# Patient Record
Sex: Female | Born: 1950 | Race: White | Hispanic: No | Marital: Married | Smoking: Former smoker
Health system: Southern US, Community
[De-identification: ages and names within clinical notes are randomized; demographics above are authoritative.]

## PROBLEM LIST (undated history)

## (undated) DIAGNOSIS — J45909 Unspecified asthma, uncomplicated: Secondary | ICD-10-CM

## (undated) DIAGNOSIS — G629 Polyneuropathy, unspecified: Secondary | ICD-10-CM

## (undated) DIAGNOSIS — Z91018 Allergy to other foods: Secondary | ICD-10-CM

## (undated) DIAGNOSIS — A6920 Lyme disease, unspecified: Secondary | ICD-10-CM

## (undated) DIAGNOSIS — J302 Other seasonal allergic rhinitis: Secondary | ICD-10-CM

## (undated) DIAGNOSIS — F419 Anxiety disorder, unspecified: Secondary | ICD-10-CM

## (undated) DIAGNOSIS — K6389 Other specified diseases of intestine: Secondary | ICD-10-CM

## (undated) DIAGNOSIS — L309 Dermatitis, unspecified: Secondary | ICD-10-CM

## (undated) DIAGNOSIS — K219 Gastro-esophageal reflux disease without esophagitis: Secondary | ICD-10-CM

## (undated) DIAGNOSIS — Z9109 Other allergy status, other than to drugs and biological substances: Secondary | ICD-10-CM

## (undated) DIAGNOSIS — R200 Anesthesia of skin: Secondary | ICD-10-CM

## (undated) DIAGNOSIS — T783XXA Angioneurotic edema, initial encounter: Secondary | ICD-10-CM

## (undated) DIAGNOSIS — I2699 Other pulmonary embolism without acute cor pulmonale: Secondary | ICD-10-CM

## (undated) HISTORY — DX: Anesthesia of skin: R20.0

## (undated) HISTORY — DX: Angioneurotic edema, initial encounter: T78.3XXA

## (undated) HISTORY — DX: Polyneuropathy, unspecified: G62.9

## (undated) HISTORY — DX: Gastro-esophageal reflux disease without esophagitis: K21.9

## (undated) HISTORY — DX: Other specified diseases of intestine: K63.89

## (undated) HISTORY — DX: Other seasonal allergic rhinitis: J30.2

## (undated) HISTORY — DX: Anxiety disorder, unspecified: F41.9

## (undated) HISTORY — DX: Allergy to other foods: Z91.018

## (undated) HISTORY — DX: Other allergy status, other than to drugs and biological substances: Z91.09

## (undated) HISTORY — PX: TONSILLECTOMY: SUR1361

## (undated) HISTORY — DX: Unspecified asthma, uncomplicated: J45.909

## (undated) HISTORY — DX: Other pulmonary embolism without acute cor pulmonale: I26.99

## (undated) HISTORY — DX: Lyme disease, unspecified: A69.20

## (undated) HISTORY — PX: APPENDECTOMY: SHX54

## (undated) HISTORY — DX: Dermatitis, unspecified: L30.9

---

## 1978-05-29 HISTORY — PX: PARTIAL HYSTERECTOMY: SHX80

## 2001-05-29 HISTORY — PX: GASTRIC BYPASS: SHX52

## 2011-05-30 HISTORY — PX: WRIST FRACTURE SURGERY: SHX121

## 2012-11-05 DIAGNOSIS — IMO0002 Reserved for concepts with insufficient information to code with codable children: Secondary | ICD-10-CM | POA: Insufficient documentation

## 2013-12-21 DIAGNOSIS — M792 Neuralgia and neuritis, unspecified: Secondary | ICD-10-CM | POA: Insufficient documentation

## 2014-05-29 HISTORY — PX: LUMBAR FUSION: SHX111

## 2014-06-25 DIAGNOSIS — D62 Acute posthemorrhagic anemia: Secondary | ICD-10-CM | POA: Insufficient documentation

## 2014-08-31 DIAGNOSIS — Z86711 Personal history of pulmonary embolism: Secondary | ICD-10-CM | POA: Insufficient documentation

## 2014-08-31 DIAGNOSIS — Z8701 Personal history of pneumonia (recurrent): Secondary | ICD-10-CM | POA: Insufficient documentation

## 2014-09-02 DIAGNOSIS — Z87891 Personal history of nicotine dependence: Secondary | ICD-10-CM | POA: Insufficient documentation

## 2014-09-02 DIAGNOSIS — R0602 Shortness of breath: Secondary | ICD-10-CM | POA: Insufficient documentation

## 2014-09-02 DIAGNOSIS — J45909 Unspecified asthma, uncomplicated: Secondary | ICD-10-CM | POA: Insufficient documentation

## 2014-09-23 DIAGNOSIS — R918 Other nonspecific abnormal finding of lung field: Secondary | ICD-10-CM | POA: Insufficient documentation

## 2015-09-23 DIAGNOSIS — D6859 Other primary thrombophilia: Secondary | ICD-10-CM | POA: Insufficient documentation

## 2015-11-18 DIAGNOSIS — R0781 Pleurodynia: Secondary | ICD-10-CM | POA: Insufficient documentation

## 2016-05-29 HISTORY — PX: COLONOSCOPY: SHX174

## 2016-06-19 DIAGNOSIS — Z5181 Encounter for therapeutic drug level monitoring: Secondary | ICD-10-CM | POA: Diagnosis not present

## 2016-06-19 DIAGNOSIS — K5730 Diverticulosis of large intestine without perforation or abscess without bleeding: Secondary | ICD-10-CM | POA: Diagnosis not present

## 2016-07-11 DIAGNOSIS — N390 Urinary tract infection, site not specified: Secondary | ICD-10-CM | POA: Diagnosis not present

## 2016-07-11 DIAGNOSIS — R1030 Lower abdominal pain, unspecified: Secondary | ICD-10-CM | POA: Diagnosis not present

## 2016-07-22 DIAGNOSIS — R1030 Lower abdominal pain, unspecified: Secondary | ICD-10-CM | POA: Diagnosis not present

## 2016-08-07 DIAGNOSIS — Z9884 Bariatric surgery status: Secondary | ICD-10-CM | POA: Diagnosis not present

## 2016-08-07 DIAGNOSIS — R5381 Other malaise: Secondary | ICD-10-CM | POA: Diagnosis not present

## 2016-08-07 DIAGNOSIS — E539 Vitamin B deficiency, unspecified: Secondary | ICD-10-CM | POA: Diagnosis not present

## 2016-08-24 DIAGNOSIS — R002 Palpitations: Secondary | ICD-10-CM | POA: Diagnosis not present

## 2016-08-24 DIAGNOSIS — I10 Essential (primary) hypertension: Secondary | ICD-10-CM | POA: Diagnosis not present

## 2016-08-24 DIAGNOSIS — Z79899 Other long term (current) drug therapy: Secondary | ICD-10-CM | POA: Diagnosis not present

## 2016-08-24 DIAGNOSIS — I517 Cardiomegaly: Secondary | ICD-10-CM | POA: Diagnosis not present

## 2016-09-05 DIAGNOSIS — R5383 Other fatigue: Secondary | ICD-10-CM | POA: Diagnosis not present

## 2016-09-11 DIAGNOSIS — I10 Essential (primary) hypertension: Secondary | ICD-10-CM | POA: Diagnosis not present

## 2016-09-13 DIAGNOSIS — J22 Unspecified acute lower respiratory infection: Secondary | ICD-10-CM | POA: Diagnosis not present

## 2016-09-13 DIAGNOSIS — R509 Fever, unspecified: Secondary | ICD-10-CM | POA: Diagnosis not present

## 2016-09-13 DIAGNOSIS — R05 Cough: Secondary | ICD-10-CM | POA: Diagnosis not present

## 2016-09-13 DIAGNOSIS — M546 Pain in thoracic spine: Secondary | ICD-10-CM | POA: Diagnosis not present

## 2016-11-25 DIAGNOSIS — R1030 Lower abdominal pain, unspecified: Secondary | ICD-10-CM | POA: Diagnosis not present

## 2016-11-25 DIAGNOSIS — R42 Dizziness and giddiness: Secondary | ICD-10-CM | POA: Diagnosis not present

## 2016-11-25 DIAGNOSIS — K5732 Diverticulitis of large intestine without perforation or abscess without bleeding: Secondary | ICD-10-CM | POA: Diagnosis not present

## 2016-11-25 DIAGNOSIS — E785 Hyperlipidemia, unspecified: Secondary | ICD-10-CM | POA: Diagnosis not present

## 2016-12-28 DIAGNOSIS — T7840XA Allergy, unspecified, initial encounter: Secondary | ICD-10-CM | POA: Diagnosis not present

## 2017-01-26 DIAGNOSIS — N390 Urinary tract infection, site not specified: Secondary | ICD-10-CM | POA: Diagnosis not present

## 2017-01-26 DIAGNOSIS — E559 Vitamin D deficiency, unspecified: Secondary | ICD-10-CM | POA: Diagnosis not present

## 2017-01-26 DIAGNOSIS — Z6831 Body mass index (BMI) 31.0-31.9, adult: Secondary | ICD-10-CM | POA: Diagnosis not present

## 2017-01-26 DIAGNOSIS — R05 Cough: Secondary | ICD-10-CM | POA: Diagnosis not present

## 2017-01-26 DIAGNOSIS — G4700 Insomnia, unspecified: Secondary | ICD-10-CM | POA: Diagnosis not present

## 2017-01-26 DIAGNOSIS — K219 Gastro-esophageal reflux disease without esophagitis: Secondary | ICD-10-CM | POA: Diagnosis not present

## 2017-01-26 DIAGNOSIS — Z87891 Personal history of nicotine dependence: Secondary | ICD-10-CM | POA: Diagnosis not present

## 2017-01-26 DIAGNOSIS — Z299 Encounter for prophylactic measures, unspecified: Secondary | ICD-10-CM | POA: Diagnosis not present

## 2017-01-26 DIAGNOSIS — J069 Acute upper respiratory infection, unspecified: Secondary | ICD-10-CM | POA: Diagnosis not present

## 2017-01-26 DIAGNOSIS — G629 Polyneuropathy, unspecified: Secondary | ICD-10-CM | POA: Diagnosis not present

## 2017-04-10 DIAGNOSIS — L920 Granuloma annulare: Secondary | ICD-10-CM | POA: Diagnosis not present

## 2017-04-10 DIAGNOSIS — K219 Gastro-esophageal reflux disease without esophagitis: Secondary | ICD-10-CM | POA: Diagnosis not present

## 2017-04-10 DIAGNOSIS — Z6831 Body mass index (BMI) 31.0-31.9, adult: Secondary | ICD-10-CM | POA: Diagnosis not present

## 2017-04-10 DIAGNOSIS — R0602 Shortness of breath: Secondary | ICD-10-CM | POA: Diagnosis not present

## 2017-04-10 DIAGNOSIS — Z299 Encounter for prophylactic measures, unspecified: Secondary | ICD-10-CM | POA: Diagnosis not present

## 2017-04-16 DIAGNOSIS — G4700 Insomnia, unspecified: Secondary | ICD-10-CM | POA: Diagnosis not present

## 2017-04-16 DIAGNOSIS — K219 Gastro-esophageal reflux disease without esophagitis: Secondary | ICD-10-CM | POA: Diagnosis not present

## 2017-04-16 DIAGNOSIS — R350 Frequency of micturition: Secondary | ICD-10-CM | POA: Diagnosis not present

## 2017-04-16 DIAGNOSIS — N390 Urinary tract infection, site not specified: Secondary | ICD-10-CM | POA: Diagnosis not present

## 2017-04-16 DIAGNOSIS — Z299 Encounter for prophylactic measures, unspecified: Secondary | ICD-10-CM | POA: Diagnosis not present

## 2017-04-16 DIAGNOSIS — Z6831 Body mass index (BMI) 31.0-31.9, adult: Secondary | ICD-10-CM | POA: Diagnosis not present

## 2017-04-18 DIAGNOSIS — K5730 Diverticulosis of large intestine without perforation or abscess without bleeding: Secondary | ICD-10-CM | POA: Diagnosis not present

## 2017-04-18 DIAGNOSIS — R1032 Left lower quadrant pain: Secondary | ICD-10-CM | POA: Diagnosis not present

## 2017-04-18 DIAGNOSIS — Z9884 Bariatric surgery status: Secondary | ICD-10-CM | POA: Diagnosis not present

## 2017-04-18 DIAGNOSIS — K5790 Diverticulosis of intestine, part unspecified, without perforation or abscess without bleeding: Secondary | ICD-10-CM | POA: Diagnosis not present

## 2017-04-18 DIAGNOSIS — K219 Gastro-esophageal reflux disease without esophagitis: Secondary | ICD-10-CM | POA: Diagnosis not present

## 2017-04-18 DIAGNOSIS — K449 Diaphragmatic hernia without obstruction or gangrene: Secondary | ICD-10-CM | POA: Diagnosis not present

## 2017-04-18 DIAGNOSIS — R350 Frequency of micturition: Secondary | ICD-10-CM | POA: Diagnosis not present

## 2017-04-18 DIAGNOSIS — R1934 Left lower quadrant abdominal rigidity: Secondary | ICD-10-CM | POA: Diagnosis not present

## 2017-05-09 DIAGNOSIS — N281 Cyst of kidney, acquired: Secondary | ICD-10-CM | POA: Diagnosis not present

## 2017-05-09 DIAGNOSIS — K219 Gastro-esophageal reflux disease without esophagitis: Secondary | ICD-10-CM | POA: Diagnosis not present

## 2017-05-09 DIAGNOSIS — G4700 Insomnia, unspecified: Secondary | ICD-10-CM | POA: Diagnosis not present

## 2017-05-09 DIAGNOSIS — Z299 Encounter for prophylactic measures, unspecified: Secondary | ICD-10-CM | POA: Diagnosis not present

## 2017-05-09 DIAGNOSIS — Z6831 Body mass index (BMI) 31.0-31.9, adult: Secondary | ICD-10-CM | POA: Diagnosis not present

## 2017-05-17 DIAGNOSIS — Z299 Encounter for prophylactic measures, unspecified: Secondary | ICD-10-CM | POA: Diagnosis not present

## 2017-05-17 DIAGNOSIS — G629 Polyneuropathy, unspecified: Secondary | ICD-10-CM | POA: Diagnosis not present

## 2017-05-17 DIAGNOSIS — Z6831 Body mass index (BMI) 31.0-31.9, adult: Secondary | ICD-10-CM | POA: Diagnosis not present

## 2017-05-17 DIAGNOSIS — R21 Rash and other nonspecific skin eruption: Secondary | ICD-10-CM | POA: Diagnosis not present

## 2017-05-17 DIAGNOSIS — Z713 Dietary counseling and surveillance: Secondary | ICD-10-CM | POA: Diagnosis not present

## 2017-05-17 DIAGNOSIS — H6120 Impacted cerumen, unspecified ear: Secondary | ICD-10-CM | POA: Diagnosis not present

## 2017-05-23 ENCOUNTER — Other Ambulatory Visit: Payer: Self-pay | Admitting: Obstetrics and Gynecology

## 2017-06-15 DIAGNOSIS — M545 Low back pain, unspecified: Secondary | ICD-10-CM | POA: Insufficient documentation

## 2017-06-15 DIAGNOSIS — G8929 Other chronic pain: Secondary | ICD-10-CM | POA: Insufficient documentation

## 2017-06-21 DIAGNOSIS — Z1331 Encounter for screening for depression: Secondary | ICD-10-CM | POA: Diagnosis not present

## 2017-06-21 DIAGNOSIS — Z6831 Body mass index (BMI) 31.0-31.9, adult: Secondary | ICD-10-CM | POA: Diagnosis not present

## 2017-06-21 DIAGNOSIS — Z7189 Other specified counseling: Secondary | ICD-10-CM | POA: Diagnosis not present

## 2017-06-21 DIAGNOSIS — Z1339 Encounter for screening examination for other mental health and behavioral disorders: Secondary | ICD-10-CM | POA: Diagnosis not present

## 2017-06-21 DIAGNOSIS — R5383 Other fatigue: Secondary | ICD-10-CM | POA: Diagnosis not present

## 2017-06-21 DIAGNOSIS — G629 Polyneuropathy, unspecified: Secondary | ICD-10-CM | POA: Diagnosis not present

## 2017-06-21 DIAGNOSIS — E559 Vitamin D deficiency, unspecified: Secondary | ICD-10-CM | POA: Diagnosis not present

## 2017-06-21 DIAGNOSIS — Z299 Encounter for prophylactic measures, unspecified: Secondary | ICD-10-CM | POA: Diagnosis not present

## 2017-06-21 DIAGNOSIS — Z79899 Other long term (current) drug therapy: Secondary | ICD-10-CM | POA: Diagnosis not present

## 2017-06-21 DIAGNOSIS — Z1211 Encounter for screening for malignant neoplasm of colon: Secondary | ICD-10-CM | POA: Diagnosis not present

## 2017-06-21 DIAGNOSIS — L920 Granuloma annulare: Secondary | ICD-10-CM | POA: Diagnosis not present

## 2017-06-21 DIAGNOSIS — Z0000 Encounter for general adult medical examination without abnormal findings: Secondary | ICD-10-CM | POA: Diagnosis not present

## 2017-07-02 DIAGNOSIS — Z6830 Body mass index (BMI) 30.0-30.9, adult: Secondary | ICD-10-CM | POA: Diagnosis not present

## 2017-07-02 DIAGNOSIS — Z87891 Personal history of nicotine dependence: Secondary | ICD-10-CM | POA: Diagnosis not present

## 2017-07-02 DIAGNOSIS — Z299 Encounter for prophylactic measures, unspecified: Secondary | ICD-10-CM | POA: Diagnosis not present

## 2017-07-02 DIAGNOSIS — Z713 Dietary counseling and surveillance: Secondary | ICD-10-CM | POA: Diagnosis not present

## 2017-07-02 DIAGNOSIS — J0110 Acute frontal sinusitis, unspecified: Secondary | ICD-10-CM | POA: Diagnosis not present

## 2017-07-05 ENCOUNTER — Encounter: Payer: Self-pay | Admitting: Internal Medicine

## 2017-07-14 DIAGNOSIS — Z87891 Personal history of nicotine dependence: Secondary | ICD-10-CM | POA: Diagnosis not present

## 2017-07-14 DIAGNOSIS — R51 Headache: Secondary | ICD-10-CM | POA: Diagnosis not present

## 2017-07-14 DIAGNOSIS — Z86711 Personal history of pulmonary embolism: Secondary | ICD-10-CM | POA: Diagnosis not present

## 2017-07-14 DIAGNOSIS — Z7982 Long term (current) use of aspirin: Secondary | ICD-10-CM | POA: Diagnosis not present

## 2017-07-14 DIAGNOSIS — Z8249 Family history of ischemic heart disease and other diseases of the circulatory system: Secondary | ICD-10-CM | POA: Diagnosis not present

## 2017-07-14 DIAGNOSIS — R000 Tachycardia, unspecified: Secondary | ICD-10-CM | POA: Diagnosis not present

## 2017-07-14 DIAGNOSIS — R9431 Abnormal electrocardiogram [ECG] [EKG]: Secondary | ICD-10-CM | POA: Diagnosis not present

## 2017-07-14 DIAGNOSIS — R42 Dizziness and giddiness: Secondary | ICD-10-CM | POA: Diagnosis not present

## 2017-07-14 DIAGNOSIS — E559 Vitamin D deficiency, unspecified: Secondary | ICD-10-CM | POA: Diagnosis not present

## 2017-07-14 DIAGNOSIS — R079 Chest pain, unspecified: Secondary | ICD-10-CM | POA: Diagnosis not present

## 2017-07-14 DIAGNOSIS — R0789 Other chest pain: Secondary | ICD-10-CM | POA: Diagnosis not present

## 2017-07-14 DIAGNOSIS — Z9884 Bariatric surgery status: Secondary | ICD-10-CM | POA: Diagnosis not present

## 2017-07-14 DIAGNOSIS — Z79899 Other long term (current) drug therapy: Secondary | ICD-10-CM | POA: Diagnosis not present

## 2017-07-14 DIAGNOSIS — G4700 Insomnia, unspecified: Secondary | ICD-10-CM | POA: Diagnosis not present

## 2017-07-14 DIAGNOSIS — K219 Gastro-esophageal reflux disease without esophagitis: Secondary | ICD-10-CM | POA: Diagnosis not present

## 2017-07-14 DIAGNOSIS — Z90710 Acquired absence of both cervix and uterus: Secondary | ICD-10-CM | POA: Diagnosis not present

## 2017-07-14 DIAGNOSIS — Z981 Arthrodesis status: Secondary | ICD-10-CM | POA: Diagnosis not present

## 2017-07-14 DIAGNOSIS — Z809 Family history of malignant neoplasm, unspecified: Secondary | ICD-10-CM | POA: Diagnosis not present

## 2017-07-14 DIAGNOSIS — M79602 Pain in left arm: Secondary | ICD-10-CM | POA: Diagnosis not present

## 2017-07-14 DIAGNOSIS — G629 Polyneuropathy, unspecified: Secondary | ICD-10-CM | POA: Diagnosis not present

## 2017-07-14 DIAGNOSIS — J321 Chronic frontal sinusitis: Secondary | ICD-10-CM | POA: Diagnosis not present

## 2017-07-15 DIAGNOSIS — R079 Chest pain, unspecified: Secondary | ICD-10-CM | POA: Diagnosis not present

## 2017-07-15 DIAGNOSIS — R000 Tachycardia, unspecified: Secondary | ICD-10-CM | POA: Diagnosis not present

## 2017-07-15 DIAGNOSIS — R42 Dizziness and giddiness: Secondary | ICD-10-CM | POA: Diagnosis not present

## 2017-07-15 DIAGNOSIS — R0789 Other chest pain: Secondary | ICD-10-CM | POA: Diagnosis not present

## 2017-07-16 DIAGNOSIS — R0602 Shortness of breath: Secondary | ICD-10-CM | POA: Diagnosis not present

## 2017-07-16 DIAGNOSIS — R079 Chest pain, unspecified: Secondary | ICD-10-CM | POA: Diagnosis not present

## 2017-07-17 DIAGNOSIS — E2839 Other primary ovarian failure: Secondary | ICD-10-CM | POA: Diagnosis not present

## 2017-07-19 DIAGNOSIS — M8580 Other specified disorders of bone density and structure, unspecified site: Secondary | ICD-10-CM | POA: Diagnosis not present

## 2017-07-19 DIAGNOSIS — R002 Palpitations: Secondary | ICD-10-CM | POA: Diagnosis not present

## 2017-07-19 DIAGNOSIS — Z299 Encounter for prophylactic measures, unspecified: Secondary | ICD-10-CM | POA: Diagnosis not present

## 2017-07-19 DIAGNOSIS — K219 Gastro-esophageal reflux disease without esophagitis: Secondary | ICD-10-CM | POA: Diagnosis not present

## 2017-07-19 DIAGNOSIS — Z6830 Body mass index (BMI) 30.0-30.9, adult: Secondary | ICD-10-CM | POA: Diagnosis not present

## 2017-07-23 DIAGNOSIS — R002 Palpitations: Secondary | ICD-10-CM | POA: Diagnosis not present

## 2017-08-10 DIAGNOSIS — R002 Palpitations: Secondary | ICD-10-CM | POA: Diagnosis not present

## 2017-08-14 DIAGNOSIS — Z299 Encounter for prophylactic measures, unspecified: Secondary | ICD-10-CM | POA: Diagnosis not present

## 2017-08-14 DIAGNOSIS — I471 Supraventricular tachycardia: Secondary | ICD-10-CM | POA: Diagnosis not present

## 2017-08-14 DIAGNOSIS — Z6831 Body mass index (BMI) 31.0-31.9, adult: Secondary | ICD-10-CM | POA: Diagnosis not present

## 2017-08-14 DIAGNOSIS — R55 Syncope and collapse: Secondary | ICD-10-CM | POA: Diagnosis not present

## 2017-08-14 DIAGNOSIS — G629 Polyneuropathy, unspecified: Secondary | ICD-10-CM | POA: Diagnosis not present

## 2017-08-14 DIAGNOSIS — M7989 Other specified soft tissue disorders: Secondary | ICD-10-CM | POA: Diagnosis not present

## 2017-08-14 DIAGNOSIS — S99911A Unspecified injury of right ankle, initial encounter: Secondary | ICD-10-CM | POA: Diagnosis not present

## 2017-08-14 DIAGNOSIS — M25571 Pain in right ankle and joints of right foot: Secondary | ICD-10-CM | POA: Diagnosis not present

## 2017-08-29 ENCOUNTER — Ambulatory Visit (INDEPENDENT_AMBULATORY_CARE_PROVIDER_SITE_OTHER): Payer: Medicare Other | Admitting: Nurse Practitioner

## 2017-08-29 ENCOUNTER — Encounter: Payer: Self-pay | Admitting: Nurse Practitioner

## 2017-08-29 DIAGNOSIS — K5900 Constipation, unspecified: Secondary | ICD-10-CM | POA: Diagnosis not present

## 2017-08-29 DIAGNOSIS — Z8719 Personal history of other diseases of the digestive system: Secondary | ICD-10-CM | POA: Diagnosis not present

## 2017-08-29 DIAGNOSIS — K5909 Other constipation: Secondary | ICD-10-CM | POA: Insufficient documentation

## 2017-08-29 DIAGNOSIS — K219 Gastro-esophageal reflux disease without esophagitis: Secondary | ICD-10-CM

## 2017-08-29 NOTE — Assessment & Plan Note (Signed)
Noted history of GERD.  Currently well controlled on PPI.  Recommend she continue to take this.  Follow-up in 3 months.

## 2017-08-29 NOTE — Assessment & Plan Note (Signed)
The patient likely has mild constipation.  She is to have a bowel movement every day.  Now she goes about every 2-3 days.  Her stools are generally soft, but does occasionally strain and has sensation of incomplete emptying.  She admits she likely does not drink enough water.  At this point I do not think she needs a prescription treatment.  I recommended increase daily water intake.  Start taking fiber supplement of choice daily.  If after 1-2 weeks she is still having her symptoms and she can add Colace 1-2 times a day as needed.  Follow-up in 3 months.

## 2017-08-29 NOTE — Assessment & Plan Note (Signed)
History of acute diverticulitis.  Colonoscopy up-to-date January 2018 which was completed in [place].  We will request records.  She was recommended to have a repeat exam in 5 years (2023).  Most recent abdominal imaging in November 2018 with diverticulosis without diverticulitis.  No significant abdominal pain today.  She does have mild abdominal pain which is likely due to mild constipation as per above.  Follow-up in 3 months.

## 2017-08-29 NOTE — Progress Notes (Signed)
Primary Care Physician:  Kirstie PeriShah, Ashish, MD Primary Gastroenterologist:  Dr. Jena Gaussourk  Chief Complaint  Patient presents with  . h/o diverticulitis    HPI:   Patricia Eaton is a 67 y.o. female who presents on referral from primary care to "establish care".  She is requesting to see Dr. Jena Gaussourk.  Reviewed information provided with referral including office visit dated 06/21/2017.  Overall she felt well at that time.  Notes normal bowel and bladder habits.  She does have a history of gastric bypass.  Appears last colonoscopy was completed in 2018 and recommended repeat in 5 years, per PCP notes.  Reviewed CT exam result report dated 04/18/2017 which found no acute findings or explanation for nausea with left lower quadrant pain.  Noted sigmoid diverticulosis without acute inflammation, previous gastric bypass with a small to moderate hiatal hernia and distal esophageal wall thickening suggestive of chronic reflux.  No other significant findings.  Today she states she's doing ok overall. Previously moved her bowel daily [facility] Previous gastric bypass. Is currently having a bowel movement once every 2-3 days. Still consistent with Bristol 4, doesn't go "a lot." Has a sensation of incomplete emptying. Also with occasional fecal urgency, rare incontinence. Dairy (ice cream) causes diarrhea typically. Occasional straining. Denies hematochezia, melena. Occasional mid to lower abdominal pain about once a week, typically lasts 10-15 minutes, crampy. Has occasional middle of the night "fever" with chills, sweating, typically resolves shortly afterward. GERD well managed on PPI. Denies unintentional weight loss. Last colon[place] was January 2018 in [place], found normal, recommended 5 year repeat (colon mass after appendectomy "inconclusive."). Seeing cardiology for chest pain. Denies any other upper or lower GI symptoms.  Past Medical History:  Diagnosis Date  . Anxiety   . Colonic mass     In [place], found inconclusive  . GERD (gastroesophageal reflux disease)   . Neuropathy   . Seasonal allergies     Past Surgical History:  Procedure Laterality Date  . APPENDECTOMY    . GASTRIC BYPASS  2003  . LUMBAR FUSION  2016  . PARTIAL HYSTERECTOMY  1980  . WRIST FRACTURE SURGERY  2013    Current Outpatient Medications  Medication Sig Dispense Refill  . aspirin EC 81 MG tablet Take 81 mg by mouth daily.    Marland Kitchen. CALCIUM PO Take by mouth daily.    . Cholecalciferol (VITAMIN D PO) Take by mouth daily.    Marland Kitchen. omeprazole (PRILOSEC) 20 MG capsule Take 20 mg by mouth daily.    . pregabalin (LYRICA) 100 MG capsule Take 300 mg by mouth daily.     No current facility-administered medications for this visit.     Allergies as of 08/29/2017  . (No Known Allergies)    Family History  Problem Relation Age of Onset  . Colon cancer Maternal Aunt   . Crohn's disease Grandchild   . Crohn's disease Son     Social History   Socioeconomic History  . Marital status: Married    Spouse name: Not on file  . Number of children: Not on file  . Years of education: Not on file  . Highest education level: Not on file  Occupational History  . Not on file  Social Needs  . Financial resource strain: Not on file  . Food insecurity:    Worry: Not on file    Inability: Not on file  . Transportation needs:    Medical: Not on file  Non-medical: Not on file  Tobacco Use  . Smoking status: Former Smoker    Last attempt to quit: 05/29/1997    Years since quitting: 20.2  . Smokeless tobacco: Never Used  Substance and Sexual Activity  . Alcohol use: Yes    Comment: occas  . Drug use: Never  . Sexual activity: Not on file  Lifestyle  . Physical activity:    Days per week: Not on file    Minutes per session: Not on file  . Stress: Not on file  Relationships  . Social connections:    Talks on phone: Not on file    Gets together: Not on file    Attends religious service: Not on file     Active member of club or organization: Not on file    Attends meetings of clubs or organizations: Not on file    Relationship status: Not on file  . Intimate partner violence:    Fear of current or ex partner: Not on file    Emotionally abused: Not on file    Physically abused: Not on file    Forced sexual activity: Not on file  Other Topics Concern  . Not on file  Social History Narrative  . Not on file    Review of Systems: General: Negative for anorexia, weight loss, fatigue, weakness. ENT: Negative for hoarseness, difficulty swallowing . CV: Negative for chest pain, angina, palpitations, peripheral edema.  Respiratory: Negative for dyspnea at rest, cough, sputum, wheezing.  GI: See history of present illness. MS: Admits chronic back pain.  Derm: Negative for rash or itching.  Endo: Negative for unusual weight change.  Heme: Negative for bruising or bleeding. Allergy: Negative for rash or hives.    Physical Exam: BP 123/78   Pulse 84   Temp 98.2 F (36.8 C) (Oral)   Ht 5\' 8"  (1.727 m)   Wt 197 lb 3.2 oz (89.4 kg)   BMI 29.98 kg/m  General:   Alert and oriented. Pleasant and cooperative. Well-nourished and well-developed.  Head:  Normocephalic and atraumatic. Eyes:  Without icterus, sclera clear and conjunctiva pink.  Ears:  Normal auditory acuity. Cardiovascular:  S1, S2 present without murmurs appreciated. Extremities without clubbing or edema. Respiratory:  Clear to auscultation bilaterally. No wheezes, rales, or rhonchi. No distress.  Gastrointestinal:  +BS, soft, and non-distended. Minimal mid-lower abdomen TTP. No HSM noted. No guarding or rebound. No masses appreciated.  Rectal:  Deferred  Musculoskalatal:  Symmetrical without gross deformities.  Neurologic:  Alert and oriented x4;  grossly normal neurologically. Psych:  Alert and cooperative. Normal mood and affect. Heme/Lymph/Immune: No excessive bruising noted.    08/29/2017 2:17 PM   Disclaimer: This  note was dictated with voice recognition software. Similar sounding words can inadvertently be transcribed and may not be corrected upon review.

## 2017-08-29 NOTE — Patient Instructions (Addendum)
1. Start taking a fiber supplement of your choice.  There are multiple options including Gummi chews, fruit chews, powders, etc.  If you need assistance selecting the best option you can discuss with the pharmacist.  I feel the best option is to 1 your most likely to take regularly. 2. Take the fiber supplement once a day. 3. If after 1-2 weeks you are not having any better bowel movements, you can add Colace over-the-counter stool softener once a day. 4. Ensure you are drinking adequate water. 5. Continue taking your other medications, such as your Prilosec for heartburn/GERD. 6. We will request a full report from your previous colonoscopy from your previous GI doctor in [place]. 7. Follow-up in 3 months. 8. Call us if you have any questions or concerns.   It was good meeting you today!  Enjoy the sunshine!!!    At [facility] we value your feedback. You may receive a survey about your visit today. Please share your experience as we strive to create trusing relationships with our patients to provide genuine, compassionate, quality care.

## 2017-08-29 NOTE — Progress Notes (Signed)
CC'D TO PCP °

## 2017-08-31 ENCOUNTER — Encounter: Payer: Self-pay | Admitting: *Deleted

## 2017-08-31 ENCOUNTER — Ambulatory Visit (INDEPENDENT_AMBULATORY_CARE_PROVIDER_SITE_OTHER): Payer: Medicare Other | Admitting: Cardiovascular Disease

## 2017-08-31 ENCOUNTER — Encounter: Payer: Self-pay | Admitting: Cardiovascular Disease

## 2017-08-31 VITALS — BP 102/68 | HR 75 | Ht 68.0 in | Wt 200.0 lb

## 2017-08-31 DIAGNOSIS — R55 Syncope and collapse: Secondary | ICD-10-CM

## 2017-08-31 DIAGNOSIS — R079 Chest pain, unspecified: Secondary | ICD-10-CM

## 2017-08-31 DIAGNOSIS — R0602 Shortness of breath: Secondary | ICD-10-CM | POA: Diagnosis not present

## 2017-08-31 DIAGNOSIS — Z8249 Family history of ischemic heart disease and other diseases of the circulatory system: Secondary | ICD-10-CM

## 2017-08-31 MED ORDER — METOPROLOL TARTRATE 50 MG PO TABS: 50.0000 mg | ORAL_TABLET | Freq: Once | ORAL | 0 refills | Status: DC

## 2017-08-31 NOTE — Progress Notes (Signed)
CARDIOLOGY CONSULT NOTE  Patient ID: Patricia Eaton MRN: [id number] DOB/AGE: [birth year] 67 y.o.  Admit date: (Not on file) Primary Physician: Kirstie PeriShah, Ashish, MD Referring Physician: Dr. Sherryll BurgerShah  Reason for Consultation: Syncope, paroxysmal supraventricular tachycardia  HPI: Patricia Eaton is a 67 y.o. female who is being seen today for the evaluation of syncope and paroxysmal supraventricular tachycardia at the request of Kirstie PeriShah, Ashish, MD.   I reviewed notes from her PCP.  It appears while she was standing, she suddenly passed out.  There were no symptoms of antecedent headaches, dizziness, or chest pain.  She does have a history of paroxysmal supraventricular tachycardia.  It appears she were cardiac monitor which I reviewed.  She had an average heart rate of 83 bpm and a maximum heart rate of 200 bpm with a minimum heart rate of 51 bpm.  There were runs of SVT with the fastest interval lasting 5 beats with a rate of 200 bpm.  There were isolated PACs and isolated PVCs.  I also personally reviewed an ECG performed on 08/14/17 which demonstrated sinus rhythm with no ischemic ST segment or T wave abnormalities, nor any arrhythmias.  Symptoms began about 2 months ago.  She began expensing exertional dyspnea and exertional dizziness.  She was also having intermittent chest pains lasting seconds but these were not associated with exertion.  She purchased a FitBit and I reviewed the phone app.  It showed heart rates as high as 150 bpm while she was sleeping at midnight.  Last summer she was very active.  Now when she wakes up in the morning she feels very fatigued.  She denies a history of sleep apnea and there have been no witnessed apneic episodes.  She has not gained any considerable amount of weight in the past several months.  It appears [facility].  I do not have any of these records.  She said an echocardiogram was performed.  Family history:  Father died of an MI at the age of 66.  He was a non-smoker and did not have diabetes or hypertension.  Social history: She is married.  Her husband is my patient as well.  They moved here from [place].   No Known Allergies  Current Outpatient Medications  Medication Sig Dispense Refill  . aspirin EC 81 MG tablet Take 81 mg by mouth daily.    Marland Kitchen. CALCIUM PO Take by mouth daily.    . Cholecalciferol (VITAMIN D PO) Take by mouth daily.    Marland Kitchen. omeprazole (PRILOSEC) 20 MG capsule Take 20 mg by mouth daily.    . pregabalin (LYRICA) 150 MG capsule Take 150 mg by mouth 2 (two) times daily.    Marland Kitchen. UNABLE TO FIND Med Name: PROBIOTIC OTC     No current facility-administered medications for this visit.     Past Medical History:  Diagnosis Date  . Anxiety   . Colonic mass    In [place], found inconclusive  . GERD (gastroesophageal reflux disease)   . Neuropathy   . Seasonal allergies     Past Surgical History:  Procedure Laterality Date  . APPENDECTOMY    . GASTRIC BYPASS  2003  . LUMBAR FUSION  2016  . PARTIAL HYSTERECTOMY  1980  . WRIST FRACTURE SURGERY  2013    Social History   Socioeconomic History  . Marital status: Married    Spouse name: Not on file  . Number of children: Not  on file  . Years of education: Not on file  . Highest education level: Not on file  Occupational History  . Not on file  Social Needs  . Financial resource strain: Not on file  . Food insecurity:    Worry: Not on file    Inability: Not on file  . Transportation needs:    Medical: Not on file    Non-medical: Not on file  Tobacco Use  . Smoking status: Former Smoker    Last attempt to quit: 05/29/1997    Years since quitting: 20.2  . Smokeless tobacco: Never Used  Substance and Sexual Activity  . Alcohol use: Yes    Comment: occas  . Drug use: Never  . Sexual activity: Not on file  Lifestyle  . Physical activity:    Days per week: Not on file    Minutes per session: Not on file  . Stress: Not  on file  Relationships  . Social connections:    Talks on phone: Not on file    Gets together: Not on file    Attends religious service: Not on file    Active member of club or organization: Not on file    Attends meetings of clubs or organizations: Not on file    Relationship status: Not on file  . Intimate partner violence:    Fear of current or ex partner: Not on file    Emotionally abused: Not on file    Physically abused: Not on file    Forced sexual activity: Not on file  Other Topics Concern  . Not on file  Social History Narrative  . Not on file      Current Meds  Medication Sig  . aspirin EC 81 MG tablet Take 81 mg by mouth daily.  Marland Kitchen CALCIUM PO Take by mouth daily.  . Cholecalciferol (VITAMIN D PO) Take by mouth daily.  Marland Kitchen omeprazole (PRILOSEC) 20 MG capsule Take 20 mg by mouth daily.  . pregabalin (LYRICA) 150 MG capsule Take 150 mg by mouth 2 (two) times daily.  Marland Kitchen UNABLE TO FIND Med Name: PROBIOTIC OTC      Review of systems complete and found to be negative unless listed above in HPI    Physical exam Blood pressure 102/68, pulse 75, height 5\' 8"  (1.727 m), weight 200 lb (90.7 kg), SpO2 98 %. General: NAD Neck: No JVD, no thyromegaly or thyroid nodule.  Lungs: Clear to auscultation bilaterally with normal respiratory effort. CV: Nondisplaced PMI. Regular rate and rhythm, normal S1/S2, no S3/S4, no murmur.  No peripheral edema.  No carotid bruit.  Abdomen: Soft, nontender, no distention.  Skin: Intact without lesions or rashes.  Neurologic: Alert and oriented x 3.  Psych: Normal affect. Extremities: No clubbing or cyanosis.  HEENT: Normal.   ECG: Most recent ECG reviewed.   Labs: No results found for: K, BUN, CREATININE, ALT, TSH, HGB   Lipids: No results found for: LDLCALC, LDLDIRECT, CHOL, TRIG, HDL      ASSESSMENT AND PLAN:  1.  Syncope: Unclear etiology at this time.  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  I will obtain records from [facility] so that I can review echocardiogram and other testing.  PSVT was seen with event monitoring which she wore for 2 weeks.  Given her family history of coronary artery disease, I will obtain a coronary CT and fractional  flow reserve if deemed necessary.  2.  Paroxysmal supraventricular tachycardia: Event monitor reviewed above.  At this time I will not initiate AV nodal blocking agents given her low normal blood pressure.  She said systolic readings are normally in the 130 range and have been in the low 100 range recently.  3.  Chest pain and exertional dyspnea: Given her family history of coronary artery disease, I will obtain a coronary CT and fractional flow reserve if deemed necessary.    Disposition: Follow up in 6 weeks.   Signed: Prentice Docker, M.D., F.A.C.C.  08/31/2017, 9:02 AM

## 2017-08-31 NOTE — Patient Instructions (Signed)
Medication Instructions:  Your physician recommends that you continue on your current medications as directed. Please refer to the Current Medication list given to you today.  Labwork: BMET  Testing/Procedures: Please arrive at the [facility] main entrance of [facility] at xx:xx AM (30-45 minutes prior to test start time)  [facility] [address] [place], [place] [address] [phone]  Proceed to the [facility] Radiology Department (First Floor).  Please follow these instructions carefully (unless otherwise directed):  Hold all erectile dysfunction medications at least 48 hours prior to test.  On the Night Before the Test: . Drink plenty of water. . Do not consume any caffeinated/decaffeinated beverages or chocolate 12 hours prior to your test. . Do not take any antihistamines 12 hours prior to your test. . If you take Metformin do not take 24 hours prior to test. . If the patient has contrast allergy: ? Patient will need a prescription for Prednisone and very clear instructions (as follows): 1. Prednisone 50 mg - take 13 hours prior to test 2. Take another Prednisone 50 mg 7 hours prior to test 3. Take another Prednisone 50 mg 1 hour prior to test 4. Take Benadryl 50 mg 1 hour prior to test . Patient must complete all four doses of above prophylactic medications. . Patient will need a ride after test due to Benadryl.  On the Day of the Test: . Drink plenty of water. Do not drink any water within one hour of the test. . Do not eat any food 4 hours prior to the test. . You may take your regular medications prior to the test. . IF NOT ON A BETA BLOCKER - Take 50 mg of lopressor (metoprolol) one hour before the test. . HOLD Furosemide morning of the test.  After the Test: . Drink plenty of water. . After receiving IV contrast, you may experience a mild flushed feeling. This is normal. . On occasion, you may experience a mild rash up to 24 hours  after the test. This is not dangerous. If this occurs, you can take Benadryl 25 mg and increase your fluid intake. . If you experience trouble breathing, this can be serious. If it is severe call 911 IMMEDIATELY. If it is mild, please call our office. . If you take any of these medications: Glipizide/Metformin, Avandament, Glucavance, please do not take 48 hours after completing test.   Follow-Up: Your physician recommends that you schedule a follow-up appointment in: 6 WEEKS WITH DR. Purvis SheffieldKONESWARAN   Any Other Special Instructions Will Be Listed Below (If Applicable).  If you need a refill on your cardiac medications before your next appointment, please call your pharmacy.

## 2017-09-05 DIAGNOSIS — Z299 Encounter for prophylactic measures, unspecified: Secondary | ICD-10-CM | POA: Diagnosis not present

## 2017-09-05 DIAGNOSIS — Z87891 Personal history of nicotine dependence: Secondary | ICD-10-CM | POA: Diagnosis not present

## 2017-09-05 DIAGNOSIS — J069 Acute upper respiratory infection, unspecified: Secondary | ICD-10-CM | POA: Diagnosis not present

## 2017-09-05 DIAGNOSIS — Z713 Dietary counseling and surveillance: Secondary | ICD-10-CM | POA: Diagnosis not present

## 2017-09-05 DIAGNOSIS — Z6831 Body mass index (BMI) 31.0-31.9, adult: Secondary | ICD-10-CM | POA: Diagnosis not present

## 2017-09-20 DIAGNOSIS — G629 Polyneuropathy, unspecified: Secondary | ICD-10-CM | POA: Diagnosis not present

## 2017-09-20 DIAGNOSIS — Z6831 Body mass index (BMI) 31.0-31.9, adult: Secondary | ICD-10-CM | POA: Diagnosis not present

## 2017-09-20 DIAGNOSIS — I471 Supraventricular tachycardia: Secondary | ICD-10-CM | POA: Diagnosis not present

## 2017-09-20 DIAGNOSIS — R5383 Other fatigue: Secondary | ICD-10-CM | POA: Diagnosis not present

## 2017-09-20 DIAGNOSIS — Z299 Encounter for prophylactic measures, unspecified: Secondary | ICD-10-CM | POA: Diagnosis not present

## 2017-09-20 DIAGNOSIS — Z87891 Personal history of nicotine dependence: Secondary | ICD-10-CM | POA: Diagnosis not present

## 2017-09-21 ENCOUNTER — Other Ambulatory Visit: Payer: Self-pay | Admitting: *Deleted

## 2017-09-21 DIAGNOSIS — R072 Precordial pain: Secondary | ICD-10-CM

## 2017-10-01 DIAGNOSIS — R079 Chest pain, unspecified: Secondary | ICD-10-CM | POA: Diagnosis not present

## 2017-10-02 ENCOUNTER — Telehealth: Payer: Self-pay | Admitting: *Deleted

## 2017-10-02 NOTE — Telephone Encounter (Signed)
-----   Message from Laqueta Linden, MD sent at 10/02/2017  8:24 AM EDT ----- good

## 2017-10-02 NOTE — Telephone Encounter (Signed)
Pt aware - routed to pcp  

## 2017-10-15 ENCOUNTER — Ambulatory Visit (HOSPITAL_COMMUNITY)
Admission: RE | Admit: 2017-10-15 | Discharge: 2017-10-15 | Disposition: A | Discharge status: Home / Self Care | Payer: Medicare Other | Source: Ambulatory Visit | Attending: Cardiovascular Disease | Admitting: Cardiovascular Disease

## 2017-10-15 ENCOUNTER — Ambulatory Visit (HOSPITAL_COMMUNITY): Payer: Medicare Other

## 2017-10-15 DIAGNOSIS — R072 Precordial pain: Secondary | ICD-10-CM | POA: Insufficient documentation

## 2017-10-15 DIAGNOSIS — K449 Diaphragmatic hernia without obstruction or gangrene: Secondary | ICD-10-CM | POA: Insufficient documentation

## 2017-10-15 DIAGNOSIS — I2510 Atherosclerotic heart disease of native coronary artery without angina pectoris: Secondary | ICD-10-CM | POA: Diagnosis not present

## 2017-10-15 DIAGNOSIS — R079 Chest pain, unspecified: Secondary | ICD-10-CM | POA: Diagnosis not present

## 2017-10-15 MED ORDER — NITROGLYCERIN 0.4 MG SL SUBL
0.8000 mg | SUBLINGUAL_TABLET | SUBLINGUAL | Status: DC | PRN
  Administered 2017-10-15: 0.8 mg via SUBLINGUAL

## 2017-10-15 MED ORDER — IOPAMIDOL (ISOVUE-370) INJECTION 76%
80.0000 mL | Freq: Once | INTRAVENOUS | Status: AC | PRN
  Administered 2017-10-15: 80 mL via INTRAVENOUS

## 2017-10-15 MED ORDER — NITROGLYCERIN 0.4 MG SL SUBL
SUBLINGUAL_TABLET | SUBLINGUAL | Status: AC
  Filled 2017-10-15: qty 2

## 2017-10-15 MED ORDER — METOPROLOL TARTRATE 5 MG/5ML IV SOLN
2.5000 mg | Freq: Once | INTRAVENOUS | Status: AC
  Administered 2017-10-15: 2.5 mg via INTRAVENOUS

## 2017-10-15 MED ORDER — IOPAMIDOL (ISOVUE-370) INJECTION 76%
INTRAVENOUS | Status: AC
  Filled 2017-10-15: qty 100

## 2017-10-15 MED ORDER — METOPROLOL TARTRATE 5 MG/5ML IV SOLN
INTRAVENOUS | Status: AC
  Filled 2017-10-15: qty 5

## 2017-10-19 ENCOUNTER — Telehealth: Payer: Self-pay | Admitting: *Deleted

## 2017-10-19 NOTE — Telephone Encounter (Signed)
Notes recorded by Lesle Chris, LPN on 09/04/8117 at 2:57 PM EDT Patient notified. Copy to pmd. Follow up scheduled for 10/24/17 in [place] office.   ------  Notes recorded by Laqueta Linden, MD on 10/15/2017 at 2:17 PM EDT No significant blockages. ------  Notes recorded by Laqueta Linden, MD on 10/15/2017 at 9:48 AM EDT Moderate hiatal hernia. Await coronary results.

## 2017-10-24 ENCOUNTER — Ambulatory Visit (INDEPENDENT_AMBULATORY_CARE_PROVIDER_SITE_OTHER): Payer: Medicare Other | Admitting: Cardiovascular Disease

## 2017-10-24 ENCOUNTER — Encounter: Payer: Self-pay | Admitting: Cardiovascular Disease

## 2017-10-24 VITALS — BP 122/60 | HR 84 | Ht 68.0 in | Wt 200.0 lb

## 2017-10-24 DIAGNOSIS — R55 Syncope and collapse: Secondary | ICD-10-CM | POA: Diagnosis not present

## 2017-10-24 DIAGNOSIS — Z8249 Family history of ischemic heart disease and other diseases of the circulatory system: Secondary | ICD-10-CM

## 2017-10-24 DIAGNOSIS — R079 Chest pain, unspecified: Secondary | ICD-10-CM | POA: Diagnosis not present

## 2017-10-24 DIAGNOSIS — I471 Supraventricular tachycardia: Secondary | ICD-10-CM | POA: Diagnosis not present

## 2017-10-24 MED ORDER — METOPROLOL TARTRATE 25 MG PO TABS: 12.5000 mg | ORAL_TABLET | Freq: Two times a day (BID) | ORAL | 3 refills | Status: DC

## 2017-10-24 NOTE — Progress Notes (Signed)
SUBJECTIVE: The patient presents for follow-up after undergoing cardiovascular testing performed for chest pain, exertional dyspnea, and syncope.  Coronary CT angiogram showed nonobstructive coronary artery disease with a coronary calcium score of 62 Agatston units.  She has a history of PSVT.  Her Fitbit has recorded heart rates as high as 140 bpm.  It apparently occurs more frequently at night when she is sleeping.  She has felt faint but denies further episodes of syncope.    Family history: Father died of an MI at the age of 84.  He was a non-smoker and did not have diabetes or hypertension.  Social history: She is married.  Her husband is my patient as well.  They moved here from [place].  Review of Systems: As per "subjective", otherwise negative.  No Known Allergies  Current Outpatient Medications  Medication Sig Dispense Refill  . aspirin EC 81 MG tablet Take 81 mg by mouth daily.    Marland Kitchen CALCIUM PO Take by mouth daily.    . Cholecalciferol (VITAMIN D PO) Take by mouth daily.    Marland Kitchen omeprazole (PRILOSEC) 20 MG capsule Take 20 mg by mouth daily.    . pregabalin (LYRICA) 150 MG capsule Take 150 mg by mouth 2 (two) times daily.    Marland Kitchen UNABLE TO FIND Med Name: PROBIOTIC OTC     No current facility-administered medications for this visit.     Past Medical History:  Diagnosis Date  . Anxiety   . Colonic mass    In [place], found inconclusive  . GERD (gastroesophageal reflux disease)   . Neuropathy   . Seasonal allergies     Past Surgical History:  Procedure Laterality Date  . APPENDECTOMY    . GASTRIC BYPASS  2003  . LUMBAR FUSION  2016  . PARTIAL HYSTERECTOMY  1980  . WRIST FRACTURE SURGERY  2013    Social History   Socioeconomic History  . Marital status: Married    Spouse name: Not on file  . Number of children: Not on file  . Years of education: Not on file  . Highest education level: Not on file  Occupational History  . Not on file  Social Needs  .  Financial resource strain: Not on file  . Food insecurity:    Worry: Not on file    Inability: Not on file  . Transportation needs:    Medical: Not on file    Non-medical: Not on file  Tobacco Use  . Smoking status: Former Smoker    Last attempt to quit: 05/29/1997    Years since quitting: 20.4  . Smokeless tobacco: Never Used  Substance and Sexual Activity  . Alcohol use: Yes    Comment: occas  . Drug use: Never  . Sexual activity: Not on file  Lifestyle  . Physical activity:    Days per week: Not on file    Minutes per session: Not on file  . Stress: Not on file  Relationships  . Social connections:    Talks on phone: Not on file    Gets together: Not on file    Attends religious service: Not on file    Active member of club or organization: Not on file    Attends meetings of clubs or organizations: Not on file    Relationship status: Not on file  . Intimate partner violence:    Fear of current or ex partner: Not on file    Emotionally abused: Not on  file    Physically abused: Not on file    Forced sexual activity: Not on file  Other Topics Concern  . Not on file  Social History Narrative  . Not on file     Vitals:   10/24/17 1356  BP: 122/60  Pulse: 84  SpO2: 95%  Weight: 200 lb (90.7 kg)  Height:  (1.727 m)    Wt Readings from Last 3 Encounters:  10/24/17 200 lb (90.7 kg)  08/31/17 200 lb (90.7 kg)  08/29/17 197 lb 3.2 oz (89.4 kg)     PHYSICAL EXAM General: NAD HEENT: Normal. Neck: No JVD, no thyromegaly. Lungs: Clear to auscultation bilaterally with normal respiratory effort. CV: Regular rate and rhythm, normal S1/S2, no S3/S4, no murmur. No pretibial or periankle edema.  No carotid bruit.   Abdomen: Soft, nontender, no distention.  Neurologic: Alert and oriented.  Psych: Normal affect. Skin: Normal. Musculoskeletal: No gross deformities.    ECG: Most recent ECG reviewed.   Labs: No results found for: K, BUN, CREATININE, ALT, TSH,  HGB   Lipids: No results found for: LDLCALC, LDLDIRECT, CHOL, TRIG, HDL     ASSESSMENT AND PLAN:  1.  Syncope: Unclear etiology at this time.  She has had no recurrence.  I have asked her to check her blood pressure when she feels lightheaded. 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 PSVT was seen with event monitoring which she wore for 2 weeks.    Coronary CT reviewed above demonstrating nonobstructive coronary artery disease.  I am starting a beta-blocker for PSVT.  2.  Paroxysmal supraventricular tachycardia:  I will start metoprolol tartrate 12.5 mg twice daily.  3.  Chest pain and exertional dyspnea: Coronary CT reviewed above demonstrating nonobstructive coronary artery disease.  She is on aspirin.  I am starting a beta-blocker for PSVT.    Disposition: Follow up 2 months   Prentice Docker, M.D., F.A.C.C.

## 2017-10-24 NOTE — Patient Instructions (Addendum)
Your physician wants you to follow-up in: 2 months  with Dr.Koneswaran      START Lopressor 12.5 mg twice a day    If you need a refill on your cardiac medications before your next appointment, please call your pharmacy.      No lab work or tests ordered today.       Thank you for choosing [facility] !

## 2017-11-01 ENCOUNTER — Encounter: Payer: Self-pay | Admitting: Nurse Practitioner

## 2017-11-21 DIAGNOSIS — M1711 Unilateral primary osteoarthritis, right knee: Secondary | ICD-10-CM | POA: Diagnosis not present

## 2017-11-21 DIAGNOSIS — I471 Supraventricular tachycardia: Secondary | ICD-10-CM | POA: Diagnosis not present

## 2017-11-21 DIAGNOSIS — Z6831 Body mass index (BMI) 31.0-31.9, adult: Secondary | ICD-10-CM | POA: Diagnosis not present

## 2017-11-21 DIAGNOSIS — K219 Gastro-esophageal reflux disease without esophagitis: Secondary | ICD-10-CM | POA: Diagnosis not present

## 2017-11-21 DIAGNOSIS — Z299 Encounter for prophylactic measures, unspecified: Secondary | ICD-10-CM | POA: Diagnosis not present

## 2017-11-21 DIAGNOSIS — M8580 Other specified disorders of bone density and structure, unspecified site: Secondary | ICD-10-CM | POA: Diagnosis not present

## 2017-11-28 DIAGNOSIS — Z713 Dietary counseling and surveillance: Secondary | ICD-10-CM | POA: Diagnosis not present

## 2017-11-28 DIAGNOSIS — N390 Urinary tract infection, site not specified: Secondary | ICD-10-CM | POA: Diagnosis not present

## 2017-11-28 DIAGNOSIS — R350 Frequency of micturition: Secondary | ICD-10-CM | POA: Diagnosis not present

## 2017-11-28 DIAGNOSIS — Z299 Encounter for prophylactic measures, unspecified: Secondary | ICD-10-CM | POA: Diagnosis not present

## 2017-11-28 DIAGNOSIS — Z6831 Body mass index (BMI) 31.0-31.9, adult: Secondary | ICD-10-CM | POA: Diagnosis not present

## 2017-12-03 ENCOUNTER — Ambulatory Visit: Payer: No Typology Code available for payment source | Admitting: Nurse Practitioner

## 2017-12-04 ENCOUNTER — Other Ambulatory Visit: Payer: Self-pay

## 2017-12-04 ENCOUNTER — Encounter: Payer: Self-pay | Admitting: Nurse Practitioner

## 2017-12-04 ENCOUNTER — Ambulatory Visit (INDEPENDENT_AMBULATORY_CARE_PROVIDER_SITE_OTHER): Payer: Medicare Other | Admitting: Nurse Practitioner

## 2017-12-04 VITALS — BP 126/81 | HR 76 | Temp 97.6°F | Ht 68.0 in | Wt 201.4 lb

## 2017-12-04 DIAGNOSIS — K219 Gastro-esophageal reflux disease without esophagitis: Secondary | ICD-10-CM

## 2017-12-04 DIAGNOSIS — R109 Unspecified abdominal pain: Secondary | ICD-10-CM

## 2017-12-04 DIAGNOSIS — R112 Nausea with vomiting, unspecified: Secondary | ICD-10-CM

## 2017-12-04 DIAGNOSIS — K5900 Constipation, unspecified: Secondary | ICD-10-CM

## 2017-12-04 NOTE — Patient Instructions (Signed)
1. Continue your current medications. 2. We will schedule your upper endoscopy for you. 3. Follow-up in 3 months. 4. Call us if you have any questions or concerns  At [facility] we value your feedback. You may receive a survey about your visit today. Please share your experience as we strive to create trusting relationships with our patients to provide genuine, compassionate, quality care.  It was great to see you today!  I hope you have a wonderful summer!!

## 2017-12-04 NOTE — Assessment & Plan Note (Signed)
Persistent nausea and vomiting.  Nausea occurs about 4 times a week, she has vomiting she eats at dinner for any time later.  She does not eat a significant amount.  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  Possible etiologies include esophagitis, gastritis, duodenitis, gastric or duodenal erosions, peptic ulcer disease, anastomotic stricture.  Less likely malignant process.  We will proceed with an upper endoscopy to further evaluate.  Continue other medications.  Follow-up in 3 months.  Proceed with EGD with Dr. Jena Gaussourk in near future: the risks, benefits, and alternatives have been discussed with the patient in detail. The patient states understanding and desires to proceed.  The patient is not on any anticoagulants, anxiolytics, chronic pain medications, or antidepressants.  Conscious sedation should be adequate for her procedure.

## 2017-12-04 NOTE — Progress Notes (Signed)
Referring Provider: Kirstie Peri, MD Primary Care Physician:  Kirstie Peri, MD Primary GI:  [name]  Chief Complaint  Patient presents with  . Abdominal Pain    currently has UTI  . Nausea    w/ some vomiting. Occas not every day  . Constipation    BM are QD in AM    HPI:   Patricia Eaton is a 67 y.o. female who presents for follow-up on constipation and diarrhea.  Patient was last seen in our office 08/29/2017 for constipation, diverticulitis, GERD.  She was initially referred by primary care in order to "establish care" with a GI practice.  History of gastric bypass.  Last colonoscopy 2018 with recommended 5-year repeat (2023).  CT exam dated 04/18/2017 found no acute findings or explanation for nausea with left lower quadrant pain.  Sigmoid diverticulosis without acute inflammation, no other significant findings.  At her last visit she was doing okay, having a bowel movement once every 2 to 3 days and still consistent with [place] 4.  Sensation of incomplete emptying.  Some fecal urgency and rare incontinence.  Dairy causes diarrhea typically.  Occasional straining.  Mid to lower abdominal pain about once a week which lasts 10 to 15 minutes is crampy.  GERD well managed on PPI.  Her previous colonoscopy was in [place].  Essentially no other GI symptoms.  Recommended fiber supplement daily, add Colace over-the-counter if no improvement in constipation after 1 to 2 weeks, drink adequate water, continue other medications, previous colonoscopy report requested from [place], follow-up in 3 months.  Today she states she's doing ok overall. Has a UTI and is being treated. Having abdominal pain lower abdominal/pelvic area and feels like it is likely due to her UTI. Having some intermittent nausea intermittently about 4 times a week, vomiting if she eats in the evening (including dinner). Not eating much generally. Denies hematemesis. Has a history of gastric bypass (surgical). When  she is having a lot of nausea, she has a lot of phlegm and coughing just prior to. Has "winter allergies." Doesn't feel phlegm/cough/nausea are due to sinus drainage. Has a lot of bloating. Denies hematochezia. Takes iron and has dark stools. Has daily bowel movement, occasional sensation of incomplete emptying. No straining. Denies chest pain, dyspnea, dizziness, lightheadedness, syncope, near syncope. Denies any other upper or lower GI symptoms.  Past Medical History:  Diagnosis Date  . Anxiety   . Colonic mass    In [place], found inconclusive  . GERD (gastroesophageal reflux disease)   . Neuropathy   . Seasonal allergies     Past Surgical History:  Procedure Laterality Date  . APPENDECTOMY    . GASTRIC BYPASS  2003  . LUMBAR FUSION  2016  . PARTIAL HYSTERECTOMY  1980  . WRIST FRACTURE SURGERY  2013    Current Outpatient Medications  Medication Sig Dispense Refill  . aspirin EC 81 MG tablet Take 81 mg by mouth daily.    Marland Kitchen CALCIUM PO Take by mouth daily.    . Cholecalciferol (VITAMIN D PO) Take by mouth daily.    . metoprolol tartrate (LOPRESSOR) 25 MG tablet Take 0.5 tablets (12.5 mg total) by mouth 2 (two) times daily. 90 tablet 3  . omeprazole (PRILOSEC) 20 MG capsule Take 40 mg by mouth daily.     . pregabalin (LYRICA) 150 MG capsule Take 150 mg by mouth 2 (two) times daily.    Marland Kitchen UNABLE TO FIND Med Name: PROBIOTIC OTC  No current facility-administered medications for this visit.     Allergies as of 12/04/2017  . (No Known Allergies)    Family History  Problem Relation Age of Onset  . Colon cancer Maternal Aunt   . Crohn's disease Grandchild   . Crohn's disease Son   . Cancer Mother        kidney or liver  . Heart failure Father     Social History   Socioeconomic History  . Marital status: Married    Spouse name: Not on file  . Number of children: Not on file  . Years of education: Not on file  . Highest education level: Not on file  Occupational History  .  Not on file  Social Needs  . Financial resource strain: Not on file  . Food insecurity:    Worry: Not on file    Inability: Not on file  . Transportation needs:    Medical: Not on file    Non-medical: Not on file  Tobacco Use  . Smoking status: Former Smoker    Last attempt to quit: 05/29/1997    Years since quitting: 20.5  . Smokeless tobacco: Never Used  Substance and Sexual Activity  . Alcohol use: Yes    Comment: occas  . Drug use: Never  . Sexual activity: Not on file  Lifestyle  . Physical activity:    Days per week: Not on file    Minutes per session: Not on file  . Stress: Not on file  Relationships  . Social connections:    Talks on phone: Not on file    Gets together: Not on file    Attends religious service: Not on file    Active member of club or organization: Not on file    Attends meetings of clubs or organizations: Not on file    Relationship status: Not on file  Other Topics Concern  . Not on file  Social History Narrative  . Not on file    Review of Systems: General: Negative for anorexia, weight loss, fever, chills, fatigue, weakness. ENT: Negative for hoarseness, difficulty swallowing. CV: Negative for chest pain, angina, palpitations, peripheral edema.  Respiratory: Negative for dyspnea at rest, cough, sputum, wheezing.  GI: See history of present illness. Endo: Negative for unusual weight change.  Heme: Negative for bruising or bleeding. Allergy: Negative for rash or hives.   Physical Exam: BP 126/81   Pulse 76   Temp 97.6 F (36.4 C) (Oral)   Ht 5\' 8"  (1.727 m)   Wt 201 lb 6.4 oz (91.4 kg)   BMI 30.62 kg/m  General:   Alert and oriented. Pleasant and cooperative. Well-nourished and well-developed.  Eyes:  Without icterus, sclera clear and conjunctiva pink.  Ears:  Normal auditory acuity. Cardiovascular:  S1, S2 present without murmurs appreciated. Extremities without clubbing or edema. Respiratory:  Clear to auscultation bilaterally.  No wheezes, rales, or rhonchi. No distress.  Gastrointestinal:  +BS, soft, non-tender and non-distended. No HSM noted. No guarding or rebound. No masses appreciated.  Rectal:  Deferred  Musculoskalatal:  Symmetrical without gross deformities. Neurologic:  Alert and oriented x4;  grossly normal neurologically. Psych:  Alert and cooperative. Normal mood and affect. Heme/Lymph/Immune: No excessive bruising noted.    12/04/2017 3:25 PM   Disclaimer: This note was dictated with voice recognition software. Similar sounding words can inadvertently be transcribed and may not be corrected upon review.

## 2017-12-04 NOTE — Assessment & Plan Note (Signed)
Patient relatively well managed.  She is having daily bowel movements, occasionally has sensation of incomplete emptying but no straining.  She is satisfied with her bowel movements at this time and is not requesting further treatment.  Continue current medications, follow-up in 3 months.

## 2017-12-04 NOTE — Assessment & Plan Note (Signed)
GERD symptoms are generally well controlled on PPI.  Continue current medications and follow-up in 3 months.

## 2017-12-05 ENCOUNTER — Encounter: Payer: Self-pay | Admitting: Internal Medicine

## 2017-12-05 NOTE — Progress Notes (Signed)
cc'ed to pcp °

## 2017-12-26 DIAGNOSIS — R350 Frequency of micturition: Secondary | ICD-10-CM | POA: Diagnosis not present

## 2017-12-26 DIAGNOSIS — I471 Supraventricular tachycardia: Secondary | ICD-10-CM | POA: Diagnosis not present

## 2017-12-26 DIAGNOSIS — E538 Deficiency of other specified B group vitamins: Secondary | ICD-10-CM | POA: Diagnosis not present

## 2017-12-26 DIAGNOSIS — Z6831 Body mass index (BMI) 31.0-31.9, adult: Secondary | ICD-10-CM | POA: Diagnosis not present

## 2017-12-26 DIAGNOSIS — Z299 Encounter for prophylactic measures, unspecified: Secondary | ICD-10-CM | POA: Diagnosis not present

## 2017-12-26 DIAGNOSIS — R5383 Other fatigue: Secondary | ICD-10-CM | POA: Diagnosis not present

## 2018-01-01 DIAGNOSIS — N3020 Other chronic cystitis without hematuria: Secondary | ICD-10-CM | POA: Diagnosis not present

## 2018-01-01 DIAGNOSIS — R3912 Poor urinary stream: Secondary | ICD-10-CM | POA: Diagnosis not present

## 2018-01-01 DIAGNOSIS — R3915 Urgency of urination: Secondary | ICD-10-CM | POA: Diagnosis not present

## 2018-01-03 ENCOUNTER — Ambulatory Visit: Payer: Medicare Other | Admitting: Cardiovascular Disease

## 2018-01-03 ENCOUNTER — Telehealth: Payer: Self-pay | Admitting: Internal Medicine

## 2018-01-03 DIAGNOSIS — R509 Fever, unspecified: Secondary | ICD-10-CM

## 2018-01-03 DIAGNOSIS — R112 Nausea with vomiting, unspecified: Secondary | ICD-10-CM

## 2018-01-03 DIAGNOSIS — R1084 Generalized abdominal pain: Secondary | ICD-10-CM

## 2018-01-03 NOTE — Telephone Encounter (Signed)
PT is aware and will go get the Xray tomorrow morning.

## 2018-01-03 NOTE — Telephone Encounter (Signed)
Possible viral gastroenteritis with N/V and abdominal discomfort with fever. I'll order a plain abdominal XRay to be done when she can get to the hospital.  If symptoms worsen or if fever gets higher than 103, be evaluated in the ER.  Please call and check on her tomorrow.

## 2018-01-03 NOTE — Telephone Encounter (Signed)
PT said she has had episodes for 10 years,  of fever and chills that will last maybe a day and then it is gone. It will occur approximately every 2 months.  Said she has told doctors this before and no one knows what causes it.   This time she has had fever and chills for a couple of days. Temp now is 100.9 and she took Tylenol about an hour before.  She has a little abdominal pain in her LLQ, but not bad. She felt some better last evening and ate some ice cream and then had some nausea and vomiting, which is unusual when she eats ice cream.   She had UTI when she came in the office, but said [place] and urine was checked and it was OK.   Please advise!

## 2018-01-03 NOTE — Addendum Note (Signed)
Addended by: Delane GingerGILL, Saraia Platner A on: 01/03/2018 03:27 PM   Modules accepted: Orders

## 2018-01-03 NOTE — Telephone Encounter (Signed)
PLEASE CALL PATIENT, IS HAVING FEVER AND CHILLS AND WANTS TO TALK TO SOMEONE [phone].

## 2018-01-04 ENCOUNTER — Ambulatory Visit: Payer: Medicare Other | Admitting: Cardiovascular Disease

## 2018-01-04 ENCOUNTER — Ambulatory Visit (HOSPITAL_COMMUNITY)
Admission: RE | Admit: 2018-01-04 | Discharge: 2018-01-04 | Disposition: A | Discharge status: Home / Self Care | Payer: Medicare Other | Source: Ambulatory Visit | Attending: Nurse Practitioner | Admitting: Nurse Practitioner

## 2018-01-04 DIAGNOSIS — I471 Supraventricular tachycardia: Secondary | ICD-10-CM | POA: Diagnosis not present

## 2018-01-04 DIAGNOSIS — R112 Nausea with vomiting, unspecified: Secondary | ICD-10-CM | POA: Diagnosis not present

## 2018-01-04 DIAGNOSIS — R109 Unspecified abdominal pain: Secondary | ICD-10-CM | POA: Diagnosis not present

## 2018-01-04 DIAGNOSIS — R509 Fever, unspecified: Secondary | ICD-10-CM | POA: Diagnosis not present

## 2018-01-04 DIAGNOSIS — K5792 Diverticulitis of intestine, part unspecified, without perforation or abscess without bleeding: Secondary | ICD-10-CM | POA: Diagnosis not present

## 2018-01-04 DIAGNOSIS — Z6831 Body mass index (BMI) 31.0-31.9, adult: Secondary | ICD-10-CM | POA: Diagnosis not present

## 2018-01-04 DIAGNOSIS — G629 Polyneuropathy, unspecified: Secondary | ICD-10-CM | POA: Diagnosis not present

## 2018-01-04 DIAGNOSIS — R1084 Generalized abdominal pain: Secondary | ICD-10-CM | POA: Diagnosis not present

## 2018-01-04 DIAGNOSIS — Z299 Encounter for prophylactic measures, unspecified: Secondary | ICD-10-CM | POA: Diagnosis not present

## 2018-01-04 NOTE — Telephone Encounter (Signed)
PT is feeling better, no fever or chills today. She had saw Dr. Sherryll BurgerShah yesterday, her PCP and he gave her antibiotics if she needed them. She said she did not want to take it until she heard from us. She had Xray this morning. Since she is feeling better she will not start on the antibiotics at this time. I told her if she worsens over the weekend to go to the ED.

## 2018-01-07 ENCOUNTER — Telehealth: Payer: Self-pay | Admitting: Internal Medicine

## 2018-01-07 NOTE — Telephone Encounter (Signed)
Pt was calling to see if her results were back from her XR. [phone]

## 2018-01-07 NOTE — Telephone Encounter (Signed)
Patricia Eaton, pt is aware that it takes several business days for results to be reviewed and resulted.

## 2018-01-09 ENCOUNTER — Encounter (HOSPITAL_COMMUNITY)
Admission: RE | Disposition: A | Discharge status: Home Health | Payer: Self-pay | Source: Ambulatory Visit | Attending: Internal Medicine

## 2018-01-09 ENCOUNTER — Encounter (HOSPITAL_COMMUNITY): Payer: Self-pay | Admitting: *Deleted

## 2018-01-09 ENCOUNTER — Other Ambulatory Visit: Payer: Self-pay

## 2018-01-09 ENCOUNTER — Ambulatory Visit (HOSPITAL_COMMUNITY)
Admission: RE | Admit: 2018-01-09 | Discharge: 2018-01-09 | Disposition: A | Discharge status: Home Health | Payer: Medicare Other | Source: Ambulatory Visit | Attending: Internal Medicine | Admitting: Internal Medicine

## 2018-01-09 DIAGNOSIS — K219 Gastro-esophageal reflux disease without esophagitis: Secondary | ICD-10-CM | POA: Diagnosis not present

## 2018-01-09 DIAGNOSIS — G629 Polyneuropathy, unspecified: Secondary | ICD-10-CM | POA: Insufficient documentation

## 2018-01-09 DIAGNOSIS — Z7982 Long term (current) use of aspirin: Secondary | ICD-10-CM | POA: Diagnosis not present

## 2018-01-09 DIAGNOSIS — R112 Nausea with vomiting, unspecified: Secondary | ICD-10-CM | POA: Insufficient documentation

## 2018-01-09 DIAGNOSIS — E669 Obesity, unspecified: Secondary | ICD-10-CM | POA: Diagnosis not present

## 2018-01-09 DIAGNOSIS — Z800 Family history of malignant neoplasm of digestive organs: Secondary | ICD-10-CM | POA: Insufficient documentation

## 2018-01-09 DIAGNOSIS — Z9884 Bariatric surgery status: Secondary | ICD-10-CM | POA: Diagnosis not present

## 2018-01-09 DIAGNOSIS — Z6830 Body mass index (BMI) 30.0-30.9, adult: Secondary | ICD-10-CM | POA: Diagnosis not present

## 2018-01-09 DIAGNOSIS — Z79899 Other long term (current) drug therapy: Secondary | ICD-10-CM | POA: Insufficient documentation

## 2018-01-09 DIAGNOSIS — Z87891 Personal history of nicotine dependence: Secondary | ICD-10-CM | POA: Diagnosis not present

## 2018-01-09 DIAGNOSIS — R109 Unspecified abdominal pain: Secondary | ICD-10-CM

## 2018-01-09 HISTORY — PX: ESOPHAGOGASTRODUODENOSCOPY: SHX5428

## 2018-01-09 SURGERY — EGD (ESOPHAGOGASTRODUODENOSCOPY)
Anesthesia: Moderate Sedation

## 2018-01-09 MED ORDER — STERILE WATER FOR IRRIGATION IR SOLN
Status: DC | PRN
  Administered 2018-01-09: 13:00:00

## 2018-01-09 MED ORDER — SODIUM CHLORIDE 0.9 % IV SOLN
INTRAVENOUS | Status: DC
  Administered 2018-01-09: 1000 mL via INTRAVENOUS

## 2018-01-09 MED ORDER — MIDAZOLAM HCL 5 MG/5ML IJ SOLN
INTRAMUSCULAR | Status: DC | PRN
  Administered 2018-01-09: 1 mg via INTRAVENOUS
  Administered 2018-01-09 (×2): 2 mg via INTRAVENOUS

## 2018-01-09 MED ORDER — ONDANSETRON HCL 4 MG/2ML IJ SOLN
INTRAMUSCULAR | Status: AC
  Filled 2018-01-09: qty 2

## 2018-01-09 MED ORDER — ONDANSETRON HCL 4 MG/2ML IJ SOLN
INTRAMUSCULAR | Status: DC | PRN
  Administered 2018-01-09: 4 mg via INTRAVENOUS

## 2018-01-09 MED ORDER — MEPERIDINE HCL 50 MG/ML IJ SOLN
INTRAMUSCULAR | Status: DC
Start: 2018-01-09 — End: 2018-01-09
  Filled 2018-01-09: qty 1

## 2018-01-09 MED ORDER — LIDOCAINE VISCOUS HCL 2 % MT SOLN
OROMUCOSAL | Status: DC | PRN
  Administered 2018-01-09: 4 mL via OROMUCOSAL

## 2018-01-09 MED ORDER — LIDOCAINE VISCOUS HCL 2 % MT SOLN
OROMUCOSAL | Status: AC
  Filled 2018-01-09: qty 15

## 2018-01-09 MED ORDER — MEPERIDINE HCL 100 MG/ML IJ SOLN
INTRAMUSCULAR | Status: DC | PRN
  Administered 2018-01-09: 25 mg via INTRAVENOUS

## 2018-01-09 MED ORDER — MIDAZOLAM HCL 5 MG/5ML IJ SOLN
INTRAMUSCULAR | Status: AC
  Filled 2018-01-09: qty 5

## 2018-01-09 NOTE — H&P (Signed)
 @LOGO @   Primary Care Physician:  Kirstie PeriShah, Ashish, MD Primary Gastroenterologist:  Dr. Jena Gaussourk  Pre-Procedure History & Physical: HPI:  Patricia Eaton is a 67 y.o. female here for further evaluation of postprandial nausea and vomiting. Status post Roux-en-Y gastric bypass.  Past Medical History:  Diagnosis Date  . Anxiety   . Colonic mass    In [place], found inconclusive  . GERD (gastroesophageal reflux disease)   . Neuropathy   . Seasonal allergies     Past Surgical History:  Procedure Laterality Date  . APPENDECTOMY    . GASTRIC BYPASS  2003  . LUMBAR FUSION  2016  . PARTIAL HYSTERECTOMY  1980  . WRIST FRACTURE SURGERY  2013    Prior to Admission medications   Medication Sig Start Date End Date Taking? Authorizing Provider  aspirin EC 81 MG tablet Take 81 mg by mouth daily.   Yes [provider]  CALCIUM PO Take 500 mg by mouth daily.    Yes [provider]  cholecalciferol (VITAMIN D) 1000 units tablet Take 5,000 Units by mouth daily.    Yes [provider]  cyanocobalamin (,VITAMIN B-12,) 1000 MCG/ML injection Inject 1,000 mcg into the muscle every 30 (thirty) days. 12/26/17  Yes [provider]  ferrous sulfate 325 (65 FE) MG tablet Take 325 mg by mouth daily with breakfast.   Yes [provider]  metoprolol tartrate (LOPRESSOR) 25 MG tablet Take 0.5 tablets (12.5 mg total) by mouth 2 (two) times daily. 10/24/17 01/22/18 Yes Laqueta LindenKoneswaran, Suresh A, MD  omeprazole (PRILOSEC) 40 MG capsule Take 40 mg by mouth every other day.    Yes [provider]  pregabalin (LYRICA) 150 MG capsule Take 150 mg by mouth 2 (two) times daily.   Yes [provider]    Allergies as of 12/04/2017  . (No Known Allergies)    Family History  Problem Relation Age of Onset  . Colon cancer Maternal Aunt   . Crohn's disease Grandchild   . Crohn's disease Son   . Cancer Mother        kidney or liver  . Heart failure Father      Social History   Socioeconomic History  . Marital status: Married    Spouse name: Not on file  . Number of children: Not on file  . Years of education: Not on file  . Highest education level: Not on file  Occupational History  . Not on file  Social Needs  . Financial resource strain: Not on file  . Food insecurity:    Worry: Not on file    Inability: Not on file  . Transportation needs:    Medical: Not on file    Non-medical: Not on file  Tobacco Use  . Smoking status: Former Smoker    Last attempt to quit: 05/29/1997    Years since quitting: 20.6  . Smokeless tobacco: Never Used  Substance and Sexual Activity  . Alcohol use: Yes    Comment: occas  . Drug use: Never  . Sexual activity: Not on file  Lifestyle  . Physical activity:    Days per week: Not on file    Minutes per session: Not on file  . Stress: Not on file  Relationships  . Social connections:    Talks on phone: Not on file    Gets together: Not on file    Attends religious service: Not on file    Active member of club or organization: Not  on file    Attends meetings of clubs or organizations: Not on file    Relationship status: Not on file  . Intimate partner violence:    Fear of current or ex partner: Not on file    Emotionally abused: Not on file    Physically abused: Not on file    Forced sexual activity: Not on file  Other Topics Concern  . Not on file  Social History Narrative  . Not on file    Review of Systems: See HPI, otherwise negative ROS  Physical Exam: BP 131/74   Pulse 62   Temp 98.1 F (36.7 C) (Oral)   Resp 16   Ht 5\' 7"  (1.702 m)   Wt 87.5 kg   SpO2 100%   BMI 30.23 kg/m  General:   Alert,  Well-developed, well-nourished, pleasant and cooperative in NAD Neck:  Supple; no masses or thyromegaly. No significant cervical adenopathy. Lungs:  Clear throughout to auscultation.   No wheezes, crackles, or rhonchi. No acute distress. Heart:  Regular rate and rhythm; no  murmurs, clicks, rubs,  or gallops. Abdomen: Non-distended, normal bowel sounds.  Soft and nontender without appreciable mass or hepatosplenomegaly.  Pulses:  Normal pulses noted. Extremities:  Without clubbing or edema.  Impression/Plan:  Postprandial nausea and vomiting without any significant abdominal pain in the setting of a distant Roux-en-Y gastric bypass surgery. Patient denies dysphagia currently.  The risks, benefits, limitations, alternatives and imponderables have been reviewed with the patient. Potential for esophageal dilation, biopsy, etc. have also been reviewed.  Questions have been answered. All parties agreeable.     Notice: This dictation was prepared with Dragon dictation along with smaller phrase technology. Any transcriptional errors that result from this process are unintentional and may not be corrected upon review.

## 2018-01-09 NOTE — Op Note (Signed)
[facility] Patient Name: Patricia Eaton Procedure Date: 01/09/2018 1:02 PM MRN: [id number] Date of Birth: [birth year] Attending MD: Patricia Pac , MD CSN: [id number] Age: 67 Admit Type: Outpatient Procedure:                Upper GI endoscopy Indications:              Nausea with vomiting Providers:                Patricia Pac, MD, Patricia Peaches. Mathis Fare RN, RN,                            Patricia Eaton, Technician Referring MD:              Medicines:                Midazolam 5 mg IV, Meperidine 25 mg IV, Ondansetron                            4 mg IV Complications:            No immediate complications. Estimated Blood Loss:     Estimated blood loss: none. Procedure:                Pre-Anesthesia Assessment:                           - Prior to the procedure, a History and Physical                            was performed, and patient medications and                            allergies were reviewed. The patient's tolerance of                            previous anesthesia was also reviewed. The risks                            and benefits of the procedure and the sedation                            options and risks were discussed with the patient.                            All questions were answered, and informed consent                            was obtained. Prior Anticoagulants: The patient has                            taken no previous anticoagulant or antiplatelet                            agents. ASA Grade Assessment: II - A patient with  mild systemic disease. After reviewing the risks                            and benefits, the patient was deemed in                            satisfactory condition to undergo the procedure.                           After obtaining informed consent, the endoscope was                            passed under direct vision. Throughout the                            procedure, the patient's  blood pressure, pulse, and                            oxygen saturations were monitored continuously. The                            [id number]-[id number] ([id number] scope was introduced through the                            mouth, and advanced to the efferent jejunal loop.                            The upper GI endoscopy was accomplished without                            difficulty. The patient tolerated the procedure                            well. Scope In: 1:23:31 PM Scope Out: 1:27:16 PM Total Procedure Duration: 0 hours 3 minutes 45 seconds  Findings:      The examined esophagus was normal. Surgically altered stomach with small       residual, normal-appearing gastric pouch. Patent and normal appearing.       Jejunal limb. Impression:               - Normal esophagus. normal-appearing residual                            stomach - S/P gastric obesity procedure. No                            endoscopic explanation for patient's symptoms.                            Gallbladder remains in situ. Appeared normal on CT                            scan last year.                           -  Moderate Sedation:      Moderate (conscious) sedation was administered by the endoscopy nurse       and supervised by the endoscopist. The following parameters were       monitored: oxygen saturation, heart rate, blood pressure, respiratory       rate, EKG, adequacy of pulmonary ventilation, and response to care.       Total physician intraservice time was 11 minutes. Recommendation:           - Patient has a contact number available for                            emergencies. The signs and symptoms of potential                            delayed complications were discussed with the                            patient. Return to normal activities tomorrow.                            Written discharge instructions were provided to the                            patient.                           - Resume  previous diet.                           - Continue present medications. Proceed with                            gallbladder ultrasound. Further recommendations to                            follow.                           - No repeat upper endoscopy.                           - Return to GI office (date not yet determined). Procedure Code(s):        --- Professional ---                           [phone], Esophagogastroduodenoscopy, flexible,                            transoral; diagnostic, including collection of                            specimen(s) by brushing or washing, when performed                            (separate procedure)  G0500, Moderate sedation services provided by the                            same physician or other qualified health care                            professional performing a gastrointestinal                            endoscopic service that sedation supports,                            requiring the presence of an independent trained                            observer to assist in the monitoring of the                            patient's level of consciousness and physiological                            status; initial 15 minutes of intra-service time;                            patient age 102 years or older (additional time may                            be reported with [id number], as appropriate) Diagnosis Code(s):        --- Professional ---                           R11.2, Nausea with vomiting, unspecified CPT copyright 2017 American Medical Association. All rights reserved. The codes documented in this report are preliminary and upon coder review may  be revised to meet current compliance requirements. Patricia Friendsobert M. Rorey Hodges, MD Patricia Pacobert [name] Reshunda Strider, MD 01/09/2018 1:40:57 PM This report has been signed electronically. Number of Addenda: 0

## 2018-01-09 NOTE — Discharge Instructions (Signed)
EGD Discharge instructions Please read the instructions outlined below and refer to this sheet in the next few weeks. These discharge instructions provide you with general information on caring for yourself after you leave the hospital. Your doctor may also give you specific instructions. While your treatment has been planned according to the most current medical practices available, unavoidable complications occasionally occur. If you have any problems or questions after discharge, please call your doctor. ACTIVITY  You may resume your regular activity but move at a slower pace for the next 24 hours.   Take frequent rest periods for the next 24 hours.   Walking will help expel (get rid of) the air and reduce the bloated feeling in your abdomen.   No driving for 24 hours (because of the anesthesia (medicine) used during the test).   You may shower.   Do not sign any important legal documents or operate any machinery for 24 hours (because of the anesthesia used during the test).  NUTRITION  Drink plenty of fluids.   You may resume your normal diet.   Begin with a light meal and progress to your normal diet.   Avoid alcoholic beverages for 24 hours or as instructed by your caregiver.  MEDICATIONS  You may resume your normal medications unless your caregiver tells you otherwise.  WHAT YOU CAN EXPECT TODAY  You may experience abdominal discomfort such as a feeling of fullness or gas pains.  FOLLOW-UP  Your doctor will discuss the results of your test with you.  SEEK IMMEDIATE MEDICAL ATTENTION IF ANY OF THE FOLLOWING OCCUR:  Excessive nausea (feeling sick to your stomach) and/or vomiting.   Severe abdominal pain and distention (swelling).   Trouble swallowing.   Temperature over 101 F (37.8 C).   Rectal bleeding or vomiting of blood.    Gallbladder ultrasound in the near future to evaluate postprandial nausea and abdominal pain  Further recommendations to  follow.

## 2018-01-10 NOTE — Telephone Encounter (Signed)
Results were previously communicated to the patient. See result note.

## 2018-01-10 NOTE — Telephone Encounter (Signed)
Noted, no further recommendations at this time. 

## 2018-01-15 ENCOUNTER — Encounter (HOSPITAL_COMMUNITY): Payer: Self-pay | Admitting: Internal Medicine

## 2018-02-11 ENCOUNTER — Telehealth: Payer: Self-pay | Admitting: Internal Medicine

## 2018-02-11 DIAGNOSIS — R112 Nausea with vomiting, unspecified: Secondary | ICD-10-CM

## 2018-02-11 DIAGNOSIS — R1084 Generalized abdominal pain: Secondary | ICD-10-CM

## 2018-02-11 NOTE — Telephone Encounter (Signed)
Patient has OV on 10/10 at 10 with EG. She is wanting to be seen sooner due to feeling bloated and abd pain. I told her she was already on a cancellation list if anything opens up sooner. She then asked to speak with EG. I told her EG was with patients and I could put a message in for the nurse to call [phone]

## 2018-02-11 NOTE — Telephone Encounter (Signed)
Spoke with pt and she feels nauseated more lately than she use to. Pt vomited most of Friday night and feels her stomach hurts with anything she eats. Pt states that she is aware that with the gastro bypass that she's had, she has to be careful with the foods she eats. Pt ate a small piece of pound cake this morning and felt like she was going to die due to the abdominal pain. Pt would like to be seen sooner and is aware that she is on the cancellation list.

## 2018-02-14 NOTE — Addendum Note (Signed)
Addended by: Delane GingerGILL, ERIC A on: 02/14/2018 03:04 PM   Modules accepted: Orders

## 2018-02-14 NOTE — Telephone Encounter (Signed)
Due to 'severe" abdominal pain associated with N/V when she eats, lets get a STAT CT abdomen to evaluate. Keep OV, keep on cancellation list. Will CC MB for scheduling.  If severe/worsening symptoms, can go to the ER for evaluation.

## 2018-02-14 NOTE — Telephone Encounter (Signed)
Spoke with patient and she prefers to go tomorrow for CT. CT scheduled for 02/15/18 at 2:30pm, arrival time 2:15pm, npo 4 hrs prior. Patient needs to pick up oral contrast today or in the AM.  Patient is aware of CT appt details. [facility] contrast. I also made patient aware she needs to have lab work done in the AM before CT can be done. She stated she will have this done in the AM at [facility]. I faxed orders over to the lab. Confirmation received.

## 2018-02-14 NOTE — Telephone Encounter (Signed)
Noted. Pt is aware. Please add to cancellation list. Pt needs a stat CT of abdomen

## 2018-02-14 NOTE — Addendum Note (Signed)
Addended by: Tommie SamsSILVA, MINDY S on: 02/14/2018 04:02 PM   Modules accepted: Orders

## 2018-02-15 ENCOUNTER — Other Ambulatory Visit (HOSPITAL_COMMUNITY)
Admission: RE | Admit: 2018-02-15 | Discharge: 2018-02-15 | Disposition: A | Discharge status: Home / Self Care | Payer: Medicare Other | Source: Ambulatory Visit | Attending: Nurse Practitioner | Admitting: Nurse Practitioner

## 2018-02-15 ENCOUNTER — Other Ambulatory Visit: Payer: Self-pay | Admitting: Nurse Practitioner

## 2018-02-15 ENCOUNTER — Encounter: Payer: Self-pay | Admitting: Internal Medicine

## 2018-02-15 ENCOUNTER — Ambulatory Visit (HOSPITAL_COMMUNITY)
Admission: RE | Admit: 2018-02-15 | Discharge: 2018-02-15 | Disposition: A | Discharge status: Home / Self Care | Payer: Medicare Other | Source: Ambulatory Visit | Attending: Nurse Practitioner | Admitting: Nurse Practitioner

## 2018-02-15 ENCOUNTER — Encounter (HOSPITAL_COMMUNITY): Payer: Self-pay

## 2018-02-15 DIAGNOSIS — K5730 Diverticulosis of large intestine without perforation or abscess without bleeding: Secondary | ICD-10-CM | POA: Insufficient documentation

## 2018-02-15 DIAGNOSIS — R112 Nausea with vomiting, unspecified: Secondary | ICD-10-CM

## 2018-02-15 DIAGNOSIS — K449 Diaphragmatic hernia without obstruction or gangrene: Secondary | ICD-10-CM | POA: Insufficient documentation

## 2018-02-15 DIAGNOSIS — K2100 Gastro-esophageal reflux disease with esophagitis, without bleeding: Secondary | ICD-10-CM

## 2018-02-15 DIAGNOSIS — I700 Atherosclerosis of aorta: Secondary | ICD-10-CM | POA: Insufficient documentation

## 2018-02-15 DIAGNOSIS — R1084 Generalized abdominal pain: Secondary | ICD-10-CM | POA: Diagnosis not present

## 2018-02-15 LAB — CREATININE, SERUM: Creatinine, Ser: 0.64 mg/dL (ref 0.44–1.00)

## 2018-02-15 LAB — BUN: BUN: 14 mg/dL (ref 8–23)

## 2018-02-15 MED ORDER — OMEPRAZOLE 40 MG PO CPDR: 40.0000 mg | DELAYED_RELEASE_CAPSULE | Freq: Two times a day (BID) | ORAL | 3 refills | Status: DC

## 2018-02-15 MED ORDER — SUCRALFATE 1 GM/10ML PO SUSP: 1.0000 g | Freq: Four times a day (QID) | ORAL | 2 refills | Status: DC

## 2018-02-15 MED ORDER — IOPAMIDOL (ISOVUE-300) INJECTION 61%
100.0000 mL | Freq: Once | INTRAVENOUS | Status: AC | PRN
  Administered 2018-02-15: 100 mL via INTRAVENOUS

## 2018-02-18 NOTE — Telephone Encounter (Signed)
On cancel list

## 2018-02-19 DIAGNOSIS — K219 Gastro-esophageal reflux disease without esophagitis: Secondary | ICD-10-CM | POA: Diagnosis not present

## 2018-02-19 DIAGNOSIS — R350 Frequency of micturition: Secondary | ICD-10-CM | POA: Diagnosis not present

## 2018-02-19 DIAGNOSIS — Z299 Encounter for prophylactic measures, unspecified: Secondary | ICD-10-CM | POA: Diagnosis not present

## 2018-02-19 DIAGNOSIS — R1031 Right lower quadrant pain: Secondary | ICD-10-CM | POA: Diagnosis not present

## 2018-02-19 DIAGNOSIS — I471 Supraventricular tachycardia: Secondary | ICD-10-CM | POA: Diagnosis not present

## 2018-02-19 DIAGNOSIS — Z6831 Body mass index (BMI) 31.0-31.9, adult: Secondary | ICD-10-CM | POA: Diagnosis not present

## 2018-02-20 DIAGNOSIS — N390 Urinary tract infection, site not specified: Secondary | ICD-10-CM | POA: Diagnosis not present

## 2018-02-20 DIAGNOSIS — R1031 Right lower quadrant pain: Secondary | ICD-10-CM | POA: Diagnosis not present

## 2018-02-20 DIAGNOSIS — G4700 Insomnia, unspecified: Secondary | ICD-10-CM | POA: Diagnosis not present

## 2018-02-20 DIAGNOSIS — Z299 Encounter for prophylactic measures, unspecified: Secondary | ICD-10-CM | POA: Diagnosis not present

## 2018-02-20 DIAGNOSIS — Z6831 Body mass index (BMI) 31.0-31.9, adult: Secondary | ICD-10-CM | POA: Diagnosis not present

## 2018-03-01 DIAGNOSIS — R5383 Other fatigue: Secondary | ICD-10-CM | POA: Diagnosis not present

## 2018-03-01 DIAGNOSIS — Z299 Encounter for prophylactic measures, unspecified: Secondary | ICD-10-CM | POA: Diagnosis not present

## 2018-03-01 DIAGNOSIS — E538 Deficiency of other specified B group vitamins: Secondary | ICD-10-CM | POA: Diagnosis not present

## 2018-03-01 DIAGNOSIS — G4700 Insomnia, unspecified: Secondary | ICD-10-CM | POA: Diagnosis not present

## 2018-03-01 DIAGNOSIS — Z6830 Body mass index (BMI) 30.0-30.9, adult: Secondary | ICD-10-CM | POA: Diagnosis not present

## 2018-03-07 ENCOUNTER — Encounter: Payer: Self-pay | Admitting: Internal Medicine

## 2018-03-07 ENCOUNTER — Ambulatory Visit (INDEPENDENT_AMBULATORY_CARE_PROVIDER_SITE_OTHER): Payer: Medicare Other | Admitting: Nurse Practitioner

## 2018-03-07 ENCOUNTER — Encounter

## 2018-03-07 ENCOUNTER — Encounter: Payer: Self-pay | Admitting: Nurse Practitioner

## 2018-03-07 VITALS — BP 106/74 | HR 79 | Temp 97.2°F | Ht 67.0 in | Wt 196.6 lb

## 2018-03-07 DIAGNOSIS — R109 Unspecified abdominal pain: Secondary | ICD-10-CM | POA: Insufficient documentation

## 2018-03-07 DIAGNOSIS — R1031 Right lower quadrant pain: Secondary | ICD-10-CM | POA: Diagnosis not present

## 2018-03-07 DIAGNOSIS — K2100 Gastro-esophageal reflux disease with esophagitis, without bleeding: Secondary | ICD-10-CM

## 2018-03-07 DIAGNOSIS — R112 Nausea with vomiting, unspecified: Secondary | ICD-10-CM

## 2018-03-07 NOTE — Progress Notes (Signed)
Referring Provider: Kirstie Peri, MD Primary Care Physician:  Kirstie Peri, MD Primary GI:  [name]  Chief Complaint  Patient presents with  . Gastroesophageal Reflux    doing ok    HPI:   Patricia Eaton is a 67 y.o. female who presents for follow-up on abdominal pain and constipation.  Patient was last seen in our office 12/04/2017 for constipation, GERD, nausea and vomiting.  History of gastric bypass.  Last colonoscopy 2018 and recommended 5-year repeat in 2023.  CT of the abdomen and pelvis 04/18/2017 with no acute findings and reassuring.  Chronic constipation.  Chronic GERD.  At her last visit she noted she had a UTI was being treated and lower abdominal/suprapubic pain felt likely due to her UTI.  Some intermittent nausea and vomiting, query antibiotic effect.  Having a lot of phlegm which is also contributing to her nausea.  Does have significant winter allergies.  Dark stools on iron.  Daily bowel movement with occasional sensation of incomplete emptying but no straining.  No other GI complaints.  Recommended continue current medications, schedule upper endoscopy, follow-up in 3 months.  EGD completed 01/09/2018 which found normal esophagus, normal residual stomach status post gastric obesity procedure.  No endoscopic explanation for patient syndromes.  Gallbladder in situ, appeared normal on CT.  Recommended continue current medications, follow-up in the office.  Today she states she's doing well overall. GERD doing ok overall on PPI. Nausea is doing better. Abdominal pain right-sided still there, has an appointment with GYN. Is on Macrobid 30 days for UTI. Denies other abdominal pain, N/V, hematochezia, melena. Denies chest pain, dyspnea, dizziness, lightheadedness, syncope, near syncope. Denies any other upper or lower GI symptoms.  Past Medical History:  Diagnosis Date  . Anxiety   . Colonic mass    In [place], found inconclusive  . GERD (gastroesophageal reflux disease)    . Neuropathy   . Seasonal allergies     Past Surgical History:  Procedure Laterality Date  . APPENDECTOMY    . ESOPHAGOGASTRODUODENOSCOPY N/A 01/09/2018   Procedure: ESOPHAGOGASTRODUODENOSCOPY (EGD);  Surgeon: Corbin Ade, MD;  [facility];  Service: Endoscopy;  Laterality: N/A;  1:00pm  . GASTRIC BYPASS  2003  . LUMBAR FUSION  2016  . PARTIAL HYSTERECTOMY  1980  . WRIST FRACTURE SURGERY  2013    Current Outpatient Medications  Medication Sig Dispense Refill  . aspirin EC 81 MG tablet Take 81 mg by mouth daily.    Marland Kitchen CALCIUM PO Take 500 mg by mouth daily.     . cholecalciferol (VITAMIN D) 1000 units tablet Take 5,000 Units by mouth daily.     . cyanocobalamin (,VITAMIN B-12,) 1000 MCG/ML injection Inject 1,000 mcg into the muscle every 30 (thirty) days.  0  . ferrous sulfate 325 (65 FE) MG tablet Take 325 mg by mouth daily with breakfast.    . metoprolol tartrate (LOPRESSOR) 25 MG tablet Take 0.5 tablets (12.5 mg total) by mouth 2 (two) times daily. 90 tablet 3  . nitrofurantoin, macrocrystal-monohydrate, (MACROBID) 100 MG capsule Take 1 capsule by mouth daily. x30 days  0  . omeprazole (PRILOSEC) 40 MG capsule Take 1 capsule (40 mg total) by mouth 2 (two) times daily. 60 capsule 3  . pregabalin (LYRICA) 150 MG capsule Take 150 mg by mouth 2 (two) times daily.     No current facility-administered medications for this visit.     Allergies as of 03/07/2018  . (No Known Allergies)  Family History  Problem Relation Age of Onset  . Colon cancer Maternal Aunt   . Crohn's disease Grandchild   . Crohn's disease Son   . Cancer Mother        kidney or liver  . Heart failure Father     Social History   Socioeconomic History  . Marital status: Married    Spouse name: Not on file  . Number of children: Not on file  . Years of education: Not on file  . Highest education level: Not on file  Occupational History  . Not on file  Social Needs  . Financial  resource strain: Not on file  . Food insecurity:    Worry: Not on file    Inability: Not on file  . Transportation needs:    Medical: Not on file    Non-medical: Not on file  Tobacco Use  . Smoking status: Former Smoker    Last attempt to quit: 05/29/1997    Years since quitting: 20.7  . Smokeless tobacco: Never Used  Substance and Sexual Activity  . Alcohol use: Yes    Comment: occas  . Drug use: Never  . Sexual activity: Not on file  Lifestyle  . Physical activity:    Days per week: Not on file    Minutes per session: Not on file  . Stress: Not on file  Relationships  . Social connections:    Talks on phone: Not on file    Gets together: Not on file    Attends religious service: Not on file    Active member of club or organization: Not on file    Attends meetings of clubs or organizations: Not on file    Relationship status: Not on file  Other Topics Concern  . Not on file  Social History Narrative  . Not on file    Review of Systems: General: Negative for anorexia, weight loss, fever, chills, fatigue, weakness. ENT: Negative for hoarseness, difficulty swallowing. CV: Negative for chest pain, angina, palpitations, peripheral edema.  Respiratory: Negative for dyspnea at rest, cough, sputum, wheezing.  GI: See history of present illness. Endo: Negative for unusual weight change.  Heme: Negative for bruising or bleeding.  Physical Exam: BP 106/74   Pulse 79   Temp (!) 97.2 F (36.2 C) (Oral)   Ht 5\' 7"  (1.702 m)   Wt 196 lb 9.6 oz (89.2 kg)   BMI 30.79 kg/m  General:   Alert and oriented. Pleasant and cooperative. Well-nourished and well-developed.  Eyes:  Without icterus, sclera clear and conjunctiva pink.  Ears:  Normal auditory acuity. Cardiovascular:  S1, S2 present without murmurs appreciated. Extremities without clubbing or edema. Respiratory:  Clear to auscultation bilaterally. No wheezes, rales, or rhonchi. No distress.  Gastrointestinal:  +BS, soft,  non-tender and non-distended. No HSM noted. No guarding or rebound. No masses appreciated.  Rectal:  Deferred  Musculoskalatal:  Symmetrical without gross deformities. Neurologic:  Alert and oriented x4;  grossly normal neurologically. Psych:  Alert and cooperative. Normal mood and affect. Heme/Lymph/Immune: No excessive bruising noted.    03/07/2018 10:28 AM   Disclaimer: This note was dictated with voice recognition software. Similar sounding words can inadvertently be transcribed and may not be corrected upon review.

## 2018-03-07 NOTE — Progress Notes (Signed)
CC'ED TO PC

## 2018-03-07 NOTE — Assessment & Plan Note (Signed)
History of right lower quadrant abdominal pain.  When we last saw her she was being treated for UTI.  She is now on chronic therapy for UTI.  She has an evaluation with gynecology coming up.  She is going to see urology as well.  Colonoscopy and CT imaging are reassuring.  Overall her typical GI symptoms including GERD and nausea are significantly improved.  Recommend she continue with her follow-ups, continue current medications and follow-up in 6 months.

## 2018-03-07 NOTE — Assessment & Plan Note (Signed)
After EGD nausea is significantly improved and essentially resolved.  Recommend she continue her current medications including PPI.  Follow-up in 6 months.

## 2018-03-07 NOTE — Patient Instructions (Signed)
1. Continue your current medications. 2. Keep her follow-up appointments with gynecology and urology. 3. Return for follow-up in 6 months. 4. Call us if you have any questions or concerns.  At [facility] we value your feedback. You may receive a survey about your visit today. Please share your experience as we strive to create trusting relationships with our patients to provide genuine, compassionate, quality care.  We appreciate your understanding and patience as we review any laboratory studies, imaging, and other diagnostic tests that are ordered as we care for you. Our office policy is 5 business days for review of these results, and any emergent or urgent results are addressed in a timely manner for your best interest. If you do not hear from our office in 1 week, please contact us.   We also encourage the use of MyChart, which contains your medical information for your review as well. If you are not enrolled in this feature, an access code is on this after visit summary for your convenience. Thank you for allowing [place] to be involved in your care.  It was great to see you today!  I hope you have a great Fall!! Enjoy the "riding weather"!

## 2018-03-07 NOTE — Assessment & Plan Note (Signed)
GERD symptoms currently well controlled on PPI.  Recommend she continue her current medications and follow-up in 6 months.

## 2018-03-08 ENCOUNTER — Encounter: Payer: Self-pay | Admitting: Cardiovascular Disease

## 2018-03-08 ENCOUNTER — Ambulatory Visit (INDEPENDENT_AMBULATORY_CARE_PROVIDER_SITE_OTHER): Payer: Medicare Other | Admitting: Cardiovascular Disease

## 2018-03-08 ENCOUNTER — Encounter

## 2018-03-08 ENCOUNTER — Ambulatory Visit: Payer: Medicare Other | Admitting: Cardiovascular Disease

## 2018-03-08 VITALS — BP 135/79 | HR 61 | Ht 68.0 in | Wt 195.8 lb

## 2018-03-08 DIAGNOSIS — M549 Dorsalgia, unspecified: Secondary | ICD-10-CM

## 2018-03-08 DIAGNOSIS — R079 Chest pain, unspecified: Secondary | ICD-10-CM

## 2018-03-08 DIAGNOSIS — R55 Syncope and collapse: Secondary | ICD-10-CM

## 2018-03-08 DIAGNOSIS — I471 Supraventricular tachycardia, unspecified: Secondary | ICD-10-CM

## 2018-03-08 DIAGNOSIS — Z8249 Family history of ischemic heart disease and other diseases of the circulatory system: Secondary | ICD-10-CM

## 2018-03-08 DIAGNOSIS — R109 Unspecified abdominal pain: Secondary | ICD-10-CM | POA: Diagnosis not present

## 2018-03-08 NOTE — Patient Instructions (Signed)
Your physician wants you to follow-up in: 1 YEAR WITH DR Reggy Eye will receive a reminder letter in the mail two months in advance. If you don't receive a letter, please call our office to schedule the follow-up appointment.  Your physician has recommended you make the following change in your medication:   STOP METOPROLOL   You have been referred to GI AND ORTHOPEDIC   Thank you for choosing [facility]!!

## 2018-03-08 NOTE — Progress Notes (Signed)
SUBJECTIVE: Patient presents for follow-up of syncope and PSVT.  She has a history of chest pain and exertional dyspnea.  Coronary CT angiogram showed nonobstructive coronary artery disease with a coronary calcium score of 62 Agatston units.  She denies chest pain.  She said her palpitations have significantly subsided.  She does not like splitting the metoprolol tablets in half.  She did not take her metoprolol this morning and her heart rate is 61 bpm.  She said "I need some referrals.  I need a different GI referral because I want to see the physician and not the assistant.  I also want to see a back doctor who is closer to where I live ".  She has right lower quadrant abdominal pain.  She saw GI yesterday.  I reviewed their notes.  She also has a history of back surgery and has some lower back pain.  She has several concerns about her husband and said her legs are swollen and his pulmonologist ordered an echocardiogram and EKG and put him on some Lasix.   Family history: Father died of an MI at the age of 50. He was a non-smoker and did not have diabetes or hypertension.  Social history: She is married. Her husband is my patient as well. They moved here from [place].  Review of Systems: As per "subjective", otherwise negative.  No Known Allergies  Current Outpatient Medications  Medication Sig Dispense Refill  . aspirin EC 81 MG tablet Take 81 mg by mouth daily.    Marland Kitchen CALCIUM PO Take 500 mg by mouth daily.     . cholecalciferol (VITAMIN D) 1000 units tablet Take 5,000 Units by mouth daily.     . cyanocobalamin (,VITAMIN B-12,) 1000 MCG/ML injection Inject 1,000 mcg into the muscle every 30 (thirty) days.  0  . ferrous sulfate 325 (65 FE) MG tablet Take 325 mg by mouth daily with breakfast.    . metoprolol tartrate (LOPRESSOR) 25 MG tablet Take 0.5 tablets (12.5 mg total) by mouth 2 (two) times daily. 90 tablet 3  . nitrofurantoin, macrocrystal-monohydrate,  (MACROBID) 100 MG capsule Take 1 capsule by mouth daily. x30 days  0  . omeprazole (PRILOSEC) 40 MG capsule Take 1 capsule (40 mg total) by mouth 2 (two) times daily. 60 capsule 3  . pregabalin (LYRICA) 150 MG capsule Take 150 mg by mouth 2 (two) times daily.     No current facility-administered medications for this visit.     Past Medical History:  Diagnosis Date  . Anxiety   . Colonic mass    In [place], found inconclusive  . GERD (gastroesophageal reflux disease)   . Neuropathy   . Seasonal allergies     Past Surgical History:  Procedure Laterality Date  . APPENDECTOMY    . ESOPHAGOGASTRODUODENOSCOPY N/A 01/09/2018   Procedure: ESOPHAGOGASTRODUODENOSCOPY (EGD);  Surgeon: Corbin Ade, MD;  Location: [facility];  Service: Endoscopy;  Laterality: N/A;  1:00pm  . GASTRIC BYPASS  2003  . LUMBAR FUSION  2016  . PARTIAL HYSTERECTOMY  1980  . WRIST FRACTURE SURGERY  2013    Social History   Socioeconomic History  . Marital status: Married    Spouse name: Not on file  . Number of children: Not on file  . Years of education: Not on file  . Highest education level: Not on file  Occupational History  . Not on file  Social Needs  . Financial resource strain: Not on  file  . Food insecurity:    Worry: Not on file    Inability: Not on file  . Transportation needs:    Medical: Not on file    Non-medical: Not on file  Tobacco Use  . Smoking status: Former Smoker    Last attempt to quit: 05/29/1997    Years since quitting: 20.7  . Smokeless tobacco: Never Used  Substance and Sexual Activity  . Alcohol use: Yes    Comment: occas  . Drug use: Never  . Sexual activity: Not on file  Lifestyle  . Physical activity:    Days per week: Not on file    Minutes per session: Not on file  . Stress: Not on file  Relationships  . Social connections:    Talks on phone: Not on file    Gets together: Not on file    Attends religious service: Not on file    Active member of club or  organization: Not on file    Attends meetings of clubs or organizations: Not on file    Relationship status: Not on file  . Intimate partner violence:    Fear of current or ex partner: Not on file    Emotionally abused: Not on file    Physically abused: Not on file    Forced sexual activity: Not on file  Other Topics Concern  . Not on file  Social History Narrative  . Not on file     Vitals:   03/08/18 1114  BP: 135/79  Pulse: 61  Weight: 195 lb 12.8 oz (88.8 kg)  Height: 5\' 8"  (1.727 m)    Wt Readings from Last 3 Encounters:  03/08/18 195 lb 12.8 oz (88.8 kg)  03/07/18 196 lb 9.6 oz (89.2 kg)  01/09/18 193 lb (87.5 kg)     PHYSICAL EXAM General: NAD HEENT: Normal. Neck: No JVD, no thyromegaly. Lungs: Clear to auscultation bilaterally with normal respiratory effort. CV: Regular rate and rhythm, normal S1/S2, no S3/S4, no murmur. No pretibial or periankle edema.  No carotid bruit.   Abdomen: Soft, nontender, no distention.  Neurologic: Alert and oriented.  Psych: Normal affect. Skin: Normal. Musculoskeletal: No gross deformities.    ECG: Reviewed above under Subjective   Labs: Lab Results  Component Value Date/Time   BUN 14 02/15/2018 09:40 AM   CREATININE 0.64 02/15/2018 09:40 AM     Lipids: No results found for: LDLCALC, LDLDIRECT, CHOL, TRIG, HDL     ASSESSMENT AND PLAN:  1. Syncope:No recurrences.  Unclear etiology at this time.  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 PSVT was seen with event monitoring which she wore for 2 weeks.   Coronary CT reviewed above demonstrating nonobstructive coronary artery disease.    2. Paroxysmal supraventricular tachycardia:She does not like splitting the Lopressor tablets.  Her palpitations have significantly subsided.  I will  discontinue this for the time being.  3. Chest pain and exertional dyspnea: Symptomatically improved. Coronary CT reviewed above demonstrating nonobstructive coronary artery disease.    4.  Low back pain: She has a history of spinal fusion surgery.  I will make a referral to a local orthopedic physician as per her request.  5.  Right lower quadrant abdominal pain: She has seen GI and I reviewed the recommendations.  She request to see another physician and  asked that I make a referral for her.   Disposition: Follow up 1 year.   Prentice Docker, M.D., F.A.C.C.

## 2018-03-22 DIAGNOSIS — N390 Urinary tract infection, site not specified: Secondary | ICD-10-CM | POA: Diagnosis not present

## 2018-03-22 DIAGNOSIS — R350 Frequency of micturition: Secondary | ICD-10-CM | POA: Diagnosis not present

## 2018-03-22 DIAGNOSIS — I471 Supraventricular tachycardia: Secondary | ICD-10-CM | POA: Diagnosis not present

## 2018-03-22 DIAGNOSIS — Z299 Encounter for prophylactic measures, unspecified: Secondary | ICD-10-CM | POA: Diagnosis not present

## 2018-03-22 DIAGNOSIS — Z6830 Body mass index (BMI) 30.0-30.9, adult: Secondary | ICD-10-CM | POA: Diagnosis not present

## 2018-03-28 DIAGNOSIS — N3020 Other chronic cystitis without hematuria: Secondary | ICD-10-CM | POA: Diagnosis not present

## 2018-03-28 DIAGNOSIS — N390 Urinary tract infection, site not specified: Secondary | ICD-10-CM | POA: Diagnosis not present

## 2018-04-01 DIAGNOSIS — N390 Urinary tract infection, site not specified: Secondary | ICD-10-CM | POA: Diagnosis not present

## 2018-04-01 DIAGNOSIS — E538 Deficiency of other specified B group vitamins: Secondary | ICD-10-CM | POA: Diagnosis not present

## 2018-04-01 DIAGNOSIS — I471 Supraventricular tachycardia: Secondary | ICD-10-CM | POA: Diagnosis not present

## 2018-04-01 DIAGNOSIS — Z299 Encounter for prophylactic measures, unspecified: Secondary | ICD-10-CM | POA: Diagnosis not present

## 2018-04-01 DIAGNOSIS — Z6830 Body mass index (BMI) 30.0-30.9, adult: Secondary | ICD-10-CM | POA: Diagnosis not present

## 2018-04-19 DIAGNOSIS — Z299 Encounter for prophylactic measures, unspecified: Secondary | ICD-10-CM | POA: Diagnosis not present

## 2018-04-19 DIAGNOSIS — I471 Supraventricular tachycardia: Secondary | ICD-10-CM | POA: Diagnosis not present

## 2018-04-19 DIAGNOSIS — R42 Dizziness and giddiness: Secondary | ICD-10-CM | POA: Diagnosis not present

## 2018-04-19 DIAGNOSIS — G629 Polyneuropathy, unspecified: Secondary | ICD-10-CM | POA: Diagnosis not present

## 2018-04-19 DIAGNOSIS — Z6831 Body mass index (BMI) 31.0-31.9, adult: Secondary | ICD-10-CM | POA: Diagnosis not present

## 2018-04-19 DIAGNOSIS — N390 Urinary tract infection, site not specified: Secondary | ICD-10-CM | POA: Diagnosis not present

## 2018-04-19 DIAGNOSIS — H6120 Impacted cerumen, unspecified ear: Secondary | ICD-10-CM | POA: Diagnosis not present

## 2018-05-07 DIAGNOSIS — Z299 Encounter for prophylactic measures, unspecified: Secondary | ICD-10-CM | POA: Diagnosis not present

## 2018-05-07 DIAGNOSIS — G9009 Other idiopathic peripheral autonomic neuropathy: Secondary | ICD-10-CM | POA: Diagnosis not present

## 2018-05-07 DIAGNOSIS — I7389 Other specified peripheral vascular diseases: Secondary | ICD-10-CM | POA: Diagnosis not present

## 2018-05-07 DIAGNOSIS — H5712 Ocular pain, left eye: Secondary | ICD-10-CM | POA: Diagnosis not present

## 2018-05-07 DIAGNOSIS — K219 Gastro-esophageal reflux disease without esophagitis: Secondary | ICD-10-CM | POA: Diagnosis not present

## 2018-05-07 DIAGNOSIS — H81393 Other peripheral vertigo, bilateral: Secondary | ICD-10-CM | POA: Diagnosis not present

## 2018-05-07 DIAGNOSIS — Z6831 Body mass index (BMI) 31.0-31.9, adult: Secondary | ICD-10-CM | POA: Diagnosis not present

## 2018-05-07 DIAGNOSIS — E538 Deficiency of other specified B group vitamins: Secondary | ICD-10-CM | POA: Diagnosis not present

## 2018-05-14 ENCOUNTER — Other Ambulatory Visit: Payer: Self-pay | Admitting: Nurse Practitioner

## 2018-05-14 DIAGNOSIS — K2100 Gastro-esophageal reflux disease with esophagitis, without bleeding: Secondary | ICD-10-CM

## 2018-05-23 DIAGNOSIS — R42 Dizziness and giddiness: Secondary | ICD-10-CM | POA: Diagnosis not present

## 2018-05-23 DIAGNOSIS — R739 Hyperglycemia, unspecified: Secondary | ICD-10-CM | POA: Diagnosis not present

## 2018-05-23 DIAGNOSIS — Z6830 Body mass index (BMI) 30.0-30.9, adult: Secondary | ICD-10-CM | POA: Diagnosis not present

## 2018-05-23 DIAGNOSIS — E1140 Type 2 diabetes mellitus with diabetic neuropathy, unspecified: Secondary | ICD-10-CM | POA: Diagnosis not present

## 2018-05-23 DIAGNOSIS — I471 Supraventricular tachycardia: Secondary | ICD-10-CM | POA: Diagnosis not present

## 2018-05-23 DIAGNOSIS — Z299 Encounter for prophylactic measures, unspecified: Secondary | ICD-10-CM | POA: Diagnosis not present

## 2018-05-23 DIAGNOSIS — E8881 Metabolic syndrome: Secondary | ICD-10-CM | POA: Diagnosis not present

## 2018-05-27 DIAGNOSIS — M79605 Pain in left leg: Secondary | ICD-10-CM | POA: Diagnosis not present

## 2018-05-27 DIAGNOSIS — I70211 Atherosclerosis of native arteries of extremities with intermittent claudication, right leg: Secondary | ICD-10-CM | POA: Diagnosis not present

## 2018-05-27 DIAGNOSIS — R202 Paresthesia of skin: Secondary | ICD-10-CM | POA: Diagnosis not present

## 2018-06-05 DIAGNOSIS — Z6831 Body mass index (BMI) 31.0-31.9, adult: Secondary | ICD-10-CM | POA: Diagnosis not present

## 2018-06-05 DIAGNOSIS — R200 Anesthesia of skin: Secondary | ICD-10-CM | POA: Diagnosis not present

## 2018-06-05 DIAGNOSIS — Z299 Encounter for prophylactic measures, unspecified: Secondary | ICD-10-CM | POA: Diagnosis not present

## 2018-06-05 DIAGNOSIS — R7309 Other abnormal glucose: Secondary | ICD-10-CM | POA: Diagnosis not present

## 2018-06-05 DIAGNOSIS — R42 Dizziness and giddiness: Secondary | ICD-10-CM | POA: Diagnosis not present

## 2018-06-05 DIAGNOSIS — R202 Paresthesia of skin: Secondary | ICD-10-CM | POA: Diagnosis not present

## 2018-06-06 DIAGNOSIS — G629 Polyneuropathy, unspecified: Secondary | ICD-10-CM | POA: Diagnosis not present

## 2018-06-06 DIAGNOSIS — Z87891 Personal history of nicotine dependence: Secondary | ICD-10-CM | POA: Diagnosis not present

## 2018-06-06 DIAGNOSIS — Z6831 Body mass index (BMI) 31.0-31.9, adult: Secondary | ICD-10-CM | POA: Diagnosis not present

## 2018-06-06 DIAGNOSIS — Z299 Encounter for prophylactic measures, unspecified: Secondary | ICD-10-CM | POA: Diagnosis not present

## 2018-06-06 DIAGNOSIS — E538 Deficiency of other specified B group vitamins: Secondary | ICD-10-CM | POA: Diagnosis not present

## 2018-06-12 DIAGNOSIS — N3020 Other chronic cystitis without hematuria: Secondary | ICD-10-CM | POA: Diagnosis not present

## 2018-06-12 DIAGNOSIS — R3915 Urgency of urination: Secondary | ICD-10-CM | POA: Diagnosis not present

## 2018-06-13 ENCOUNTER — Telehealth: Payer: Self-pay | Admitting: Neurology

## 2018-06-13 ENCOUNTER — Ambulatory Visit (INDEPENDENT_AMBULATORY_CARE_PROVIDER_SITE_OTHER): Payer: Medicare Other | Admitting: Neurology

## 2018-06-13 ENCOUNTER — Encounter: Payer: Self-pay | Admitting: Neurology

## 2018-06-13 VITALS — BP 124/82 | HR 77 | Ht 68.0 in | Wt 198.2 lb

## 2018-06-13 DIAGNOSIS — M5416 Radiculopathy, lumbar region: Secondary | ICD-10-CM

## 2018-06-13 DIAGNOSIS — R202 Paresthesia of skin: Secondary | ICD-10-CM | POA: Insufficient documentation

## 2018-06-13 NOTE — Telephone Encounter (Signed)
Medicare/OC health patient is scheduled at [facility] for 06/28/18 arrival time is 10:45 AM patient is aware of time & day.

## 2018-06-13 NOTE — Progress Notes (Addendum)
PATIENT: Patricia Eaton DOB: 09-15-1950  Chief Complaint  Patient presents with  . Numbness    Reports numbness and tingling in her left leg.  Her dose of Lyrica was just increased to 200mg  BID but it is not helpful for her symptoms.   Marland Kitchen PCP    Kirstie Peri, MD     HISTORICAL  Patricia Eaton is a 68 year old female, seen in request by her primary care physician Dr. Sherryll Burger, Beatrix Fetters for evaluation of bilateral lower extremity paresthesia, initial evaluation was on June 13, 2017.  I have reviewed and summarized the referring note from the referring physician.  She recently moved from [place], had a history of lumbar degenerative disease, had low back surgery 3 times, in 2015 had vasectomy twice, eventually had lumbar fusion in 2016, then she suffered pulmonary emboli, she also had a history of gastric bypass surgery in 2004, with 130 pound weight loss.  She noticed numbness tingling of bilateral feet besides her leg worsening low back pain since 2015, worse on the left side, numbness of left leg, left foot, she denies significant gait abnormality, no bowel and bladder incontinence.  Her symptoms gradually getting worse over the past few years, has frequent left calf muscle cramping, sometimes with certain sudden positional movement, she had total numbness of bilateral lower extremity.  She also complains of intermittent bilateral upper extremity and hands paresthesia when raising arm overhead sometimes.  Laboratory evaluation showed normal CBC hemoglobin of 13.today, CMP, TSH 2.49   REVIEW OF SYSTEMS: Full 14 system review of systems performed and notable only for numbness, weakness, dizziness, swelling in legs, fatigue All other review of systems were negative.  ALLERGIES: No Known Allergies  HOME MEDICATIONS: Current Outpatient Medications  Medication Sig Dispense Refill  . aspirin EC 81 MG tablet Take 81 mg by mouth daily.    Marland Kitchen CALCIUM PO Take 500 mg by  mouth daily.     . cholecalciferol (VITAMIN D) 1000 units tablet Take 5,000 Units by mouth daily.     . cyanocobalamin (,VITAMIN B-12,) 1000 MCG/ML injection Inject 1,000 mcg into the muscle every 30 (thirty) days.  0  . ferrous sulfate 325 (65 FE) MG tablet Take 325 mg by mouth daily with breakfast.    . nitrofurantoin, macrocrystal-monohydrate, (MACROBID) 100 MG capsule Take 1 capsule by mouth daily. x30 days  0  . omeprazole (PRILOSEC) 40 MG capsule TAKE 1 CAPSULE BY MOUTH TWICE A DAY 180 capsule 2  . pregabalin (LYRICA) 200 MG capsule Take 200 mg by mouth 2 (two) times daily.     No current facility-administered medications for this visit.     PAST MEDICAL HISTORY: Past Medical History:  Diagnosis Date  . Anxiety   . Colonic mass    In [place], found inconclusive  . GERD (gastroesophageal reflux disease)   . Neuropathy   . Numbness   . Pulmonary embolism (HCC)   . Seasonal allergies     PAST SURGICAL HISTORY: Past Surgical History:  Procedure Laterality Date  . APPENDECTOMY    . ESOPHAGOGASTRODUODENOSCOPY N/A 01/09/2018   Procedure: ESOPHAGOGASTRODUODENOSCOPY (EGD);  Surgeon: Corbin Ade, MD;  Location[facility];  Service: Endoscopy;  Laterality: N/A;  1:00pm  . GASTRIC BYPASS  2003  . LUMBAR FUSION  2016  . PARTIAL HYSTERECTOMY  1980  . WRIST FRACTURE SURGERY  2013    FAMILY HISTORY: Family History  Problem Relation Age of Onset  . Colon cancer Maternal Aunt   .  Crohn's disease Grandchild   . Crohn's disease Son   . Cancer Mother        kidney or liver  . Heart failure Father   . Heart attack Father   . Pancreatic cancer Sister   . Stroke Maternal Grandmother     SOCIAL HISTORY: Social History   Socioeconomic History  . Marital status: Married    Spouse name: Not on file  . Number of children: 3  . Years of education: 8312  . Highest education level: High school graduate  Occupational History  . Occupation: Retired from trucking  Social Needs  .  Financial resource strain: Not on file  . Food insecurity:    Worry: Not on file    Inability: Not on file  . Transportation needs:    Medical: Not on file    Non-medical: Not on file  Tobacco Use  . Smoking status: Former Smoker    Types: Cigarettes    Last attempt to quit: 05/29/1997    Years since quitting: 21.0  . Smokeless tobacco: Never Used  Substance and Sexual Activity  . Alcohol use: Yes    Comment: occasional  . Drug use: Never  . Sexual activity: Not on file  Lifestyle  . Physical activity:    Days per week: Not on file    Minutes per session: Not on file  . Stress: Not on file  Relationships  . Social connections:    Talks on phone: Not on file    Gets together: Not on file    Attends religious service: Not on file    Active member of club or organization: Not on file    Attends meetings of clubs or organizations: Not on file    Relationship status: Not on file  . Intimate partner violence:    Fear of current or ex partner: Not on file    Emotionally abused: Not on file    Physically abused: Not on file    Forced sexual activity: Not on file  Other Topics Concern  . Not on file  Social History Narrative   Lives at home husband.   Right-handed.   2 cups per day.      PHYSICAL EXAM   Vitals:   06/13/18 0713  BP: 124/82  Pulse: 77  Weight: 198 lb 4 oz (89.9 kg)  Height: 5\' 8"  (1.727 m)    Not recorded      Body mass index is 30.14 kg/m.  PHYSICAL EXAMNIATION:  Gen: NAD, conversant, well nourised, obese, well groomed                     Cardiovascular: Regular rate rhythm, no peripheral edema, warm, nontender. Eyes: Conjunctivae clear without exudates or hemorrhage Neck: Supple, no carotid bruits. Pulmonary: Clear to auscultation bilaterally   NEUROLOGICAL EXAM:  MENTAL STATUS: Speech:    Speech is normal; fluent and spontaneous with normal comprehension.  Cognition:     Orientation to time, place and person     Normal recent and  remote memory     Normal Attention span and concentration     Normal Language, naming, repeating,spontaneous speech     Fund of knowledge   CRANIAL NERVES: CN II: Visual fields are full to confrontation. Fundoscopic exam is normal with sharp discs and no vascular changes. Pupils are round equal and briskly reactive to light. CN III, IV, VI: extraocular movement are normal. No ptosis. CN V: Facial sensation is intact to pinprick  in all 3 divisions bilaterally. Corneal responses are intact.  CN VII: Face is symmetric with normal eye closure and smile. CN VIII: Hearing is normal to rubbing fingers CN IX, X: Palate elevates symmetrically. Phonation is normal. CN XI: Head turning and shoulder shrug are intact CN XII: Tongue is midline with normal movements and no atrophy.  MOTOR: There is no pronator drift of out-stretched arms. Muscle bulk and tone are normal. Muscle strength is normal, with exception of mild left toe extension weakness.  REFLEXES: Reflexes are 2+ and symmetric at the biceps, triceps, knees, and ankles. Plantar responses are flexor.  SENSORY: Intact to light touch, pinprick, positional sensation and vibratory sensation are intact in fingers and toes.  COORDINATION: Rapid alternating movements and fine finger movements are intact. There is no dysmetria on finger-to-nose and heel-knee-shin.    GAIT/STANCE: Posture is normal. Gait is steady with normal steps, base, arm swing, and turning. Heel and toe walking are normal. Tandem gait is normal.  Romberg is absent.   DIAGNOSTIC DATA (LABS, IMAGING, TESTING) - I reviewed patient records, labs, notes, testing and imaging myself where available.   ASSESSMENT AND PLAN  Patricia Eaton is a 68 y.o. female   Bilateral lower extremity paresthesia  Differentiation diagnosis include lumbar radiculopathy, need to rule out peripheral neuropathy, she has brisk upper and lower extremity reflexes, which raised the  possibility of cervical spondylitic myelopathy  Proceed with EMG nerve conduction study  CT scan of lumbar, cervical, she has severe claustrophobia, require IV sedation for MRI  Continue Lyrica  Not a candidate for NSAIDs due to previous gastric bypass surgery,  Low back stretching exercise   Levert FeinsteinYijun Gee Habig, M.D. Ph.D.  [facility] [address], [address] [place], [place] [address] Ph: [phone] Fax: [phone]  CC: Referring Provider

## 2018-06-24 DIAGNOSIS — Z7189 Other specified counseling: Secondary | ICD-10-CM | POA: Diagnosis not present

## 2018-06-24 DIAGNOSIS — E559 Vitamin D deficiency, unspecified: Secondary | ICD-10-CM | POA: Diagnosis not present

## 2018-06-24 DIAGNOSIS — Z1331 Encounter for screening for depression: Secondary | ICD-10-CM | POA: Diagnosis not present

## 2018-06-24 DIAGNOSIS — Z6830 Body mass index (BMI) 30.0-30.9, adult: Secondary | ICD-10-CM | POA: Diagnosis not present

## 2018-06-24 DIAGNOSIS — Z0000 Encounter for general adult medical examination without abnormal findings: Secondary | ICD-10-CM | POA: Diagnosis not present

## 2018-06-24 DIAGNOSIS — R42 Dizziness and giddiness: Secondary | ICD-10-CM | POA: Diagnosis not present

## 2018-06-24 DIAGNOSIS — R5383 Other fatigue: Secondary | ICD-10-CM | POA: Diagnosis not present

## 2018-06-24 DIAGNOSIS — Z1211 Encounter for screening for malignant neoplasm of colon: Secondary | ICD-10-CM | POA: Diagnosis not present

## 2018-06-24 DIAGNOSIS — Z1339 Encounter for screening examination for other mental health and behavioral disorders: Secondary | ICD-10-CM | POA: Diagnosis not present

## 2018-06-24 DIAGNOSIS — Z79899 Other long term (current) drug therapy: Secondary | ICD-10-CM | POA: Diagnosis not present

## 2018-06-24 DIAGNOSIS — Z299 Encounter for prophylactic measures, unspecified: Secondary | ICD-10-CM | POA: Diagnosis not present

## 2018-06-28 ENCOUNTER — Ambulatory Visit (HOSPITAL_COMMUNITY): Payer: Medicare Other

## 2018-07-08 DIAGNOSIS — Z87891 Personal history of nicotine dependence: Secondary | ICD-10-CM | POA: Diagnosis not present

## 2018-07-08 DIAGNOSIS — Z299 Encounter for prophylactic measures, unspecified: Secondary | ICD-10-CM | POA: Diagnosis not present

## 2018-07-08 DIAGNOSIS — Z6829 Body mass index (BMI) 29.0-29.9, adult: Secondary | ICD-10-CM | POA: Diagnosis not present

## 2018-07-08 DIAGNOSIS — J019 Acute sinusitis, unspecified: Secondary | ICD-10-CM | POA: Diagnosis not present

## 2018-07-11 ENCOUNTER — Ambulatory Visit (HOSPITAL_COMMUNITY)
Admission: RE | Admit: 2018-07-11 | Discharge: 2018-07-11 | Disposition: A | Discharge status: Home / Self Care | Payer: Medicare Other | Source: Ambulatory Visit | Attending: Neurology | Admitting: Neurology

## 2018-07-11 DIAGNOSIS — R202 Paresthesia of skin: Secondary | ICD-10-CM | POA: Insufficient documentation

## 2018-07-11 DIAGNOSIS — M47812 Spondylosis without myelopathy or radiculopathy, cervical region: Secondary | ICD-10-CM | POA: Diagnosis not present

## 2018-07-11 DIAGNOSIS — M5416 Radiculopathy, lumbar region: Secondary | ICD-10-CM | POA: Insufficient documentation

## 2018-07-11 DIAGNOSIS — M48061 Spinal stenosis, lumbar region without neurogenic claudication: Secondary | ICD-10-CM | POA: Diagnosis not present

## 2018-07-11 DIAGNOSIS — M47816 Spondylosis without myelopathy or radiculopathy, lumbar region: Secondary | ICD-10-CM | POA: Diagnosis not present

## 2018-07-11 DIAGNOSIS — M5126 Other intervertebral disc displacement, lumbar region: Secondary | ICD-10-CM | POA: Diagnosis not present

## 2018-07-15 ENCOUNTER — Telehealth: Payer: Self-pay | Admitting: Neurology

## 2018-07-15 NOTE — Telephone Encounter (Signed)
Please call patient, CT of cervical spine showed evidence of multilevel degenerative changes, with evidence of mild spinal stenosis suspected at C6-7, variable degree of foraminal narrowing  CT of lumbar spine showed posterior lumbar fusion from L4-S1, moderate to severe spinal stenosis at L2-3 level, above previous fusion,  I will review films with her at next follow-up visit  IMPRESSION: 1. No acute osseous abnormality in the cervical spine. 2. 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 Mild to moderate left C6 and bilateral C7 foraminal stenosis. 3. Cervical facet degeneration, maximal on the right at C7-T1 where there is mild vacuum facet.  IMPRESSION: 1. Posterior lumbar fusion from L4 through S1 with interbody cage at L5-S1. 2. At L2-3 there is a broad-based disc bulge. Severe bilateral facet arthropathy. Moderate-severe spinal stenosis. 3. Lumbar spine spondylosis as described above. 4.  No acute osseous injury of the lumbar spine.

## 2018-07-16 NOTE — Telephone Encounter (Signed)
Spoke to patient - she is aware of her CT results.  She will keep her pending NCV/EMG scheduled on 07/19/2018.

## 2018-07-19 ENCOUNTER — Encounter: Payer: Medicare Other | Admitting: Neurology

## 2018-07-25 ENCOUNTER — Telehealth: Payer: Self-pay | Admitting: Cardiovascular Disease

## 2018-07-25 ENCOUNTER — Ambulatory Visit (INDEPENDENT_AMBULATORY_CARE_PROVIDER_SITE_OTHER): Payer: Medicare Other | Admitting: Cardiovascular Disease

## 2018-07-25 ENCOUNTER — Encounter: Payer: Self-pay | Admitting: Cardiovascular Disease

## 2018-07-25 VITALS — BP 120/79 | HR 74 | Ht 68.0 in | Wt 195.0 lb

## 2018-07-25 DIAGNOSIS — I471 Supraventricular tachycardia: Secondary | ICD-10-CM | POA: Diagnosis not present

## 2018-07-25 DIAGNOSIS — R42 Dizziness and giddiness: Secondary | ICD-10-CM

## 2018-07-25 NOTE — Patient Instructions (Signed)
Medication Instructions:  Continue all current medications.  Labwork:   Testing/Procedures:  Your physician has recommended that you wear a 30 day event monitor. Event monitors are medical devices that record the heart's electrical activity. Doctors most often [place] these monitors to diagnose arrhythmias. Arrhythmias are problems with the speed or rhythm of the heartbeat. The monitor is a small, portable device. You can wear one while you do your normal daily activities. This is usually used to diagnose what is causing palpitations/syncope (passing out).  Office will contact with results via phone or letter.    Follow-Up: 3-4 months   Any Other Special Instructions Will Be Listed Below (If Applicable).  If you need a refill on your cardiac medications before your next appointment, please call your pharmacy.

## 2018-07-25 NOTE — Telephone Encounter (Signed)
30 day event for dizziness, palpitations

## 2018-07-25 NOTE — Progress Notes (Signed)
SUBJECTIVE: The patient presents for routine follow-up.  She recently underwent CT of the cervical spine which showed evidence of multilevel degenerative changes with mild spinal stenosis at C6-C7 with a variable degree of foraminal narrowing.  CT of the lumbar spine showed posterior lumbar fusion from L4-S1 with moderate to severe spinal stenosis at L2-L3 level above the previous fusion.  In summary, coronary CT angiogram on 10/15/2017 showed nonobstructive coronary artery disease with a coronary calcium score of 62Agatstonunits.  She has been having dizzy spells.  They can occur while she is watching TV or when she is lying down.  She has a chronic history of tinnitus.  She denies earaches.  She checks her blood pressure and she has not been hypotensive.  She denies chest pain and shortness of breath.  ECG performed in the office today which I ordered and personally interpreted demonstrates normal sinus rhythm with no ischemic ST segment or T-wave abnormalities, nor any arrhythmias.    Family history: Father died of an MI at the age of 71. He was a non-smoker and did not have diabetes or hypertension.  Social history: She is married. Her husband is my patient as well. They moved here from [place].  Review of Systems: As per "subjective", otherwise negative.  No Known Allergies  Current Outpatient Medications  Medication Sig Dispense Refill  . aspirin EC 81 MG tablet Take 81 mg by mouth daily.    Marland Kitchen CALCIUM PO Take 500 mg by mouth daily.     . cholecalciferol (VITAMIN D) 1000 units tablet Take 5,000 Units by mouth daily.     . cyanocobalamin (,VITAMIN B-12,) 1000 MCG/ML injection Inject 1,000 mcg into the muscle every 30 (thirty) days.  0  . ferrous sulfate 325 (65 FE) MG tablet Take 325 mg by mouth daily with breakfast.    . fluticasone (FLONASE) 50 MCG/ACT nasal spray Place 2 sprays into both nostrils daily.    Marland Kitchen omeprazole (PRILOSEC) 40 MG capsule TAKE 1 CAPSULE BY  MOUTH TWICE A DAY 180 capsule 2  . pregabalin (LYRICA) 200 MG capsule Take 200 mg by mouth 2 (two) times daily.    Marland Kitchen tiZANidine (ZANAFLEX) 4 MG tablet Take 1 tablet by mouth daily as needed.    . trimethoprim (TRIMPEX) 100 MG tablet Take 100 mg by mouth daily.     No current facility-administered medications for this visit.     Past Medical History:  Diagnosis Date  . Anxiety   . Colonic mass    In [place], found inconclusive  . GERD (gastroesophageal reflux disease)   . Neuropathy   . Numbness   . Pulmonary embolism (HCC)   . Seasonal allergies     Past Surgical History:  Procedure Laterality Date  . APPENDECTOMY    . ESOPHAGOGASTRODUODENOSCOPY N/A 01/09/2018   Procedure: ESOPHAGOGASTRODUODENOSCOPY (EGD);  Surgeon: Corbin Ade, MD;  Location[facility];  Service: Endoscopy;  Laterality: N/A;  1:00pm  . GASTRIC BYPASS  2003  . LUMBAR FUSION  2016  . PARTIAL HYSTERECTOMY  1980  . WRIST FRACTURE SURGERY  2013    Social History   Socioeconomic History  . Marital status: Married    Spouse name: Not on file  . Number of children: 3  . Years of education: 35  . Highest education level: High school graduate  Occupational History  . Occupation: Retired from trucking  Social Needs  . Financial resource strain: Not on file  . Food insecurity:  Worry: Not on file    Inability: Not on file  . Transportation needs:    Medical: Not on file    Non-medical: Not on file  Tobacco Use  . Smoking status: Former Smoker    Types: Cigarettes    Last attempt to quit: 05/29/1997    Years since quitting: 21.1  . Smokeless tobacco: Never Used  Substance and Sexual Activity  . Alcohol use: Yes    Comment: occasional  . Drug use: Never  . Sexual activity: Not on file  Lifestyle  . Physical activity:    Days per week: Not on file    Minutes per session: Not on file  . Stress: Not on file  Relationships  . Social connections:    Talks on phone: Not on file    Gets together: Not  on file    Attends religious service: Not on file    Active member of club or organization: Not on file    Attends meetings of clubs or organizations: Not on file    Relationship status: Not on file  . Intimate partner violence:    Fear of current or ex partner: Not on file    Emotionally abused: Not on file    Physically abused: Not on file    Forced sexual activity: Not on file  Other Topics Concern  . Not on file  Social History Narrative   Lives at home husband.   Right-handed.   2 cups per day.      Vitals:   07/25/18 1006 07/25/18 1015 07/25/18 1016 07/25/18 1018  BP: 123/82 118/81 128/82 120/79  Pulse: 70 77 79 74  SpO2: 95% 96% 96% 95%  Weight: 195 lb (88.5 kg)     Height: 5\' 8"  (1.727 m)       Wt Readings from Last 3 Encounters:  07/25/18 195 lb (88.5 kg)  06/13/18 198 lb 4 oz (89.9 kg)  03/08/18 195 lb 12.8 oz (88.8 kg)     PHYSICAL EXAM General: NAD HEENT: Normal. Neck: No JVD, no thyromegaly. Lungs: Clear to auscultation bilaterally with normal respiratory effort. CV: Regular rate and rhythm, normal S1/S2, no S3/S4, no murmur. No pretibial or periankle edema.  No carotid bruit.   Abdomen: Soft, nontender, no distention.  Neurologic: Alert and oriented.  Psych: Normal affect. Skin: Normal. Musculoskeletal: No gross deformities.    ECG: Reviewed above under Subjective   Labs: Lab Results  Component Value Date/Time   BUN 14 02/15/2018 09:40 AM   CREATININE 0.64 02/15/2018 09:40 AM     Lipids: No results found for: LDLCALC, LDLDIRECT, CHOL, TRIG, HDL     ASSESSMENT AND PLAN:  1.  Dizziness: I do not think this is related to a cardiac etiology.  She seldom has palpitations.  Orthostatics were checked today and they were normal.  ECG is also normal today.  I will obtain a 30-day event monitor to assess for bradycardia arrhythmias.  2. Paroxysmal supraventricular tachycardia: Metoprolol was stopped on 03/08/2018 because she did not like  splitting the Lopressor tablets and palpitations had subsided.   Disposition: Follow up 3 to 4 months   Prentice Docker, M.D., F.A.C.C.

## 2018-07-26 DIAGNOSIS — Z6829 Body mass index (BMI) 29.0-29.9, adult: Secondary | ICD-10-CM | POA: Diagnosis not present

## 2018-07-26 DIAGNOSIS — Z299 Encounter for prophylactic measures, unspecified: Secondary | ICD-10-CM | POA: Diagnosis not present

## 2018-07-26 DIAGNOSIS — K219 Gastro-esophageal reflux disease without esophagitis: Secondary | ICD-10-CM | POA: Diagnosis not present

## 2018-07-26 DIAGNOSIS — E538 Deficiency of other specified B group vitamins: Secondary | ICD-10-CM | POA: Diagnosis not present

## 2018-08-12 ENCOUNTER — Telehealth: Payer: Self-pay | Admitting: *Deleted

## 2018-08-12 DIAGNOSIS — Z6831 Body mass index (BMI) 31.0-31.9, adult: Secondary | ICD-10-CM | POA: Diagnosis not present

## 2018-08-12 DIAGNOSIS — Z299 Encounter for prophylactic measures, unspecified: Secondary | ICD-10-CM | POA: Diagnosis not present

## 2018-08-12 DIAGNOSIS — K219 Gastro-esophageal reflux disease without esophagitis: Secondary | ICD-10-CM | POA: Diagnosis not present

## 2018-08-12 DIAGNOSIS — J189 Pneumonia, unspecified organism: Secondary | ICD-10-CM | POA: Diagnosis not present

## 2018-08-12 DIAGNOSIS — Z87891 Personal history of nicotine dependence: Secondary | ICD-10-CM | POA: Diagnosis not present

## 2018-08-12 NOTE — Telephone Encounter (Signed)
Noted, patient does not want to see APP.

## 2018-08-12 NOTE — Telephone Encounter (Signed)
Can have her see APP instead.

## 2018-08-12 NOTE — Telephone Encounter (Signed)
Patient has sent her event monitor back as she was told she had to pay $190.00 & she could not afford this.  Follow up OV is already scheduled for 10/24/2018 in [place] office.

## 2018-08-19 ENCOUNTER — Encounter: Payer: Medicare Other | Admitting: Neurology

## 2018-09-03 DIAGNOSIS — Z713 Dietary counseling and surveillance: Secondary | ICD-10-CM | POA: Diagnosis not present

## 2018-09-03 DIAGNOSIS — Z299 Encounter for prophylactic measures, unspecified: Secondary | ICD-10-CM | POA: Diagnosis not present

## 2018-09-03 DIAGNOSIS — E538 Deficiency of other specified B group vitamins: Secondary | ICD-10-CM | POA: Diagnosis not present

## 2018-09-03 DIAGNOSIS — R0789 Other chest pain: Secondary | ICD-10-CM | POA: Diagnosis not present

## 2018-09-03 DIAGNOSIS — Z6831 Body mass index (BMI) 31.0-31.9, adult: Secondary | ICD-10-CM | POA: Diagnosis not present

## 2018-09-10 ENCOUNTER — Ambulatory Visit: Payer: Medicare Other | Admitting: Nurse Practitioner

## 2018-09-30 DIAGNOSIS — R309 Painful micturition, unspecified: Secondary | ICD-10-CM | POA: Diagnosis not present

## 2018-09-30 DIAGNOSIS — R42 Dizziness and giddiness: Secondary | ICD-10-CM | POA: Diagnosis not present

## 2018-09-30 DIAGNOSIS — Z299 Encounter for prophylactic measures, unspecified: Secondary | ICD-10-CM | POA: Diagnosis not present

## 2018-09-30 DIAGNOSIS — Z6831 Body mass index (BMI) 31.0-31.9, adult: Secondary | ICD-10-CM | POA: Diagnosis not present

## 2018-09-30 DIAGNOSIS — E538 Deficiency of other specified B group vitamins: Secondary | ICD-10-CM | POA: Diagnosis not present

## 2018-09-30 DIAGNOSIS — N390 Urinary tract infection, site not specified: Secondary | ICD-10-CM | POA: Diagnosis not present

## 2018-10-07 DIAGNOSIS — J301 Allergic rhinitis due to pollen: Secondary | ICD-10-CM | POA: Diagnosis not present

## 2018-10-09 DIAGNOSIS — M4727 Other spondylosis with radiculopathy, lumbosacral region: Secondary | ICD-10-CM | POA: Diagnosis not present

## 2018-10-09 DIAGNOSIS — Z6831 Body mass index (BMI) 31.0-31.9, adult: Secondary | ICD-10-CM | POA: Diagnosis not present

## 2018-10-09 DIAGNOSIS — Z9884 Bariatric surgery status: Secondary | ICD-10-CM | POA: Diagnosis not present

## 2018-10-09 DIAGNOSIS — K219 Gastro-esophageal reflux disease without esophagitis: Secondary | ICD-10-CM | POA: Diagnosis not present

## 2018-10-09 DIAGNOSIS — Z713 Dietary counseling and surveillance: Secondary | ICD-10-CM | POA: Diagnosis not present

## 2018-10-09 DIAGNOSIS — R635 Abnormal weight gain: Secondary | ICD-10-CM | POA: Diagnosis not present

## 2018-10-14 DIAGNOSIS — R509 Fever, unspecified: Secondary | ICD-10-CM | POA: Diagnosis not present

## 2018-10-14 DIAGNOSIS — Z299 Encounter for prophylactic measures, unspecified: Secondary | ICD-10-CM | POA: Diagnosis not present

## 2018-10-14 DIAGNOSIS — Z6831 Body mass index (BMI) 31.0-31.9, adult: Secondary | ICD-10-CM | POA: Diagnosis not present

## 2018-10-14 DIAGNOSIS — I2699 Other pulmonary embolism without acute cor pulmonale: Secondary | ICD-10-CM | POA: Diagnosis not present

## 2018-10-16 DIAGNOSIS — N390 Urinary tract infection, site not specified: Secondary | ICD-10-CM | POA: Diagnosis not present

## 2018-10-16 DIAGNOSIS — N3020 Other chronic cystitis without hematuria: Secondary | ICD-10-CM | POA: Diagnosis not present

## 2018-10-16 DIAGNOSIS — Z20828 Contact with and (suspected) exposure to other viral communicable diseases: Secondary | ICD-10-CM | POA: Diagnosis not present

## 2018-10-24 ENCOUNTER — Encounter: Payer: Self-pay | Admitting: Orthopedic Surgery

## 2018-10-24 ENCOUNTER — Ambulatory Visit (INDEPENDENT_AMBULATORY_CARE_PROVIDER_SITE_OTHER): Payer: Medicare Other

## 2018-10-24 ENCOUNTER — Ambulatory Visit (INDEPENDENT_AMBULATORY_CARE_PROVIDER_SITE_OTHER): Payer: Medicare Other | Admitting: Orthopedic Surgery

## 2018-10-24 ENCOUNTER — Ambulatory Visit: Payer: Medicare Other | Admitting: Cardiovascular Disease

## 2018-10-24 ENCOUNTER — Other Ambulatory Visit: Payer: Self-pay

## 2018-10-24 DIAGNOSIS — M549 Dorsalgia, unspecified: Secondary | ICD-10-CM

## 2018-10-24 MED ORDER — DIAZEPAM 10 MG PO TABS: 10.0000 mg | ORAL_TABLET | ORAL | 0 refills | Status: DC

## 2018-10-24 NOTE — Progress Notes (Signed)
Office Visit Note   Patient: Patricia Eaton           Date of Birth: 06-28-1950           MRN: [id number] Visit Date: 10/24/2018 Requested by: Kirstie Peri, MD [address] [place], [place] [address] PCP: Kirstie Peri, MD  Subjective: Chief Complaint  Patient presents with  . Middle Back - Pain    HPI: Patricia Eaton is a patient with thoracic back pain.  She has mid upper back pain around her bra strap.  Is been going on for 2 months.  Has had previous lower back surgery and another physician has ordered CT scan of her lower back.  That does show fusion in good position but also shows disc bulging above the level of the fusion.  She is having nerve conduction study done for her left leg.  She is on Lyrica.  Denies any history of injury.  She has been doing some heavy lifting.              ROS: All systems reviewed are negative as they relate to the chief complaint within the history of present illness.  Patient denies  fevers or chills.   Assessment & Plan: Visit Diagnoses:  1. Upper back pain     Plan: Impression is thoracic back pain.  She has a work-up in progress for her lower back.  I think she has definite pathology above the level of the fusion in her lower back.  In regards to the thoracic spine she has definitive pain in the midthoracic region with possible degenerative joint changes.  I would favor open MRI T-spine to evaluate thoracic disc herniation.  Valium prescribed.  I will see her back after that study.  She may need to get some injections in her thoracic spine for that.  Follow-Up Instructions: Return for after MRI.   Orders:  Orders Placed This Encounter  Procedures  . XR Thoracic Spine 2 View  . MR Thoracic Spine w/o contrast   Meds ordered this encounter  Medications  . DISCONTD: diazepam (VALIUM) 10 MG tablet    Sig: Take 1 tablet (10 mg total) by mouth as directed.    Dispense:  2 tablet    Refill:  0  . diazepam (VALIUM) 10 MG tablet    Sig: Take 1  tablet (10 mg total) by mouth as directed.    Dispense:  2 tablet    Refill:  0      Procedures: No procedures performed   Clinical Data: No additional findings.  Objective: Vital Signs: There were no vitals taken for this visit.  Physical Exam:   Constitutional: Patient appears well-developed HEENT:  Head: Normocephalic Eyes:EOM are normal Neck: Normal range of motion Cardiovascular: Normal rate Pulmonary/chest: Effort normal Neurologic: Patient is alert Skin: Skin is warm Psychiatric: Patient has normal mood and affect    Ortho Exam: Ortho exam demonstrates full active and passive range of motion of her knees hips and ankles.  Does have some mildly positive nerve root tension signs on the left negative on the right but good ankle dorsiflexion plantarflexion quad hamstring strength with 0-1+ reflexes bilateral patella and Achilles.  Pedal pulses intact bilaterally  Specialty Comments:  No specialty comments available.  Imaging: Xr Thoracic Spine 2 View  Result Date: 10/24/2018 [facility] lateral thoracic spine reviewed.  Slight kyphosis is present.  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  No significant  scoliosis.    PMFS History: Patient Active Problem List   Diagnosis Date Noted  . Paresthesia 06/13/2018  . Lumbar radiculopathy 06/13/2018  . Abdominal pain 03/07/2018  . Nausea with vomiting 12/04/2017  . Constipation 08/29/2017  . History of diverticulitis 08/29/2017  . GERD (gastroesophageal reflux disease) 08/29/2017   Past Medical History:  Diagnosis Date  . Anxiety   . Colonic mass    In [place], found inconclusive  . GERD (gastroesophageal reflux disease)   . Neuropathy   . Numbness   . Pulmonary embolism (HCC)   . Seasonal allergies     Family History  Problem Relation Age of Onset  . Colon cancer Maternal Aunt   . Crohn's disease Grandchild   . Crohn's disease Son   . Cancer Mother         kidney or liver  . Heart failure Father   . Heart attack Father   . Pancreatic cancer Sister   . Stroke Maternal Grandmother     Past Surgical History:  Procedure Laterality Date  . APPENDECTOMY    . ESOPHAGOGASTRODUODENOSCOPY N/A 01/09/2018   Procedure: ESOPHAGOGASTRODUODENOSCOPY (EGD);  Surgeon: Corbin Adeourk, Robert M, MD;  Location: [facility];  Service: Endoscopy;  Laterality: N/A;  1:00pm  . GASTRIC BYPASS  2003  . LUMBAR FUSION  2016  . PARTIAL HYSTERECTOMY  1980  . WRIST FRACTURE SURGERY  2013   Social History   Occupational History  . Occupation: Retired from trucking  Tobacco Use  . Smoking status: Former Smoker    Types: Cigarettes    Last attempt to quit: 05/29/1997    Years since quitting: 21.4  . Smokeless tobacco: Never Used  Substance and Sexual Activity  . Alcohol use: Yes    Comment: occasional  . Drug use: Never  . Sexual activity: Not on file

## 2018-10-26 ENCOUNTER — Telehealth: Payer: Self-pay | Admitting: *Deleted

## 2018-10-26 DIAGNOSIS — Z20822 Contact with and (suspected) exposure to covid-19: Secondary | ICD-10-CM

## 2018-10-26 NOTE — Telephone Encounter (Signed)
Contacted pt regarding recent ortho appointment, and possible exposure to COVID; she denies fever, cough, or SOB; she agrees to testing; pt offered and accepted appointment at [facility] site 10/28/2018 at 1130; pt given address, directions, and instructions that she and all occupants of her vehicle should wear a mask; she verbalizes understanding; orders placed per protocol

## 2018-10-26 NOTE — Addendum Note (Signed)
Addended by: Redmond Baseman on: 10/26/2018 10:18 AM   Modules accepted: Orders

## 2018-10-28 ENCOUNTER — Other Ambulatory Visit: Payer: Self-pay

## 2018-10-28 ENCOUNTER — Other Ambulatory Visit: Payer: PRIVATE HEALTH INSURANCE

## 2018-10-28 DIAGNOSIS — J301 Allergic rhinitis due to pollen: Secondary | ICD-10-CM | POA: Diagnosis not present

## 2018-10-28 DIAGNOSIS — Z20822 Contact with and (suspected) exposure to covid-19: Secondary | ICD-10-CM

## 2018-10-28 DIAGNOSIS — M549 Dorsalgia, unspecified: Secondary | ICD-10-CM

## 2018-10-29 LAB — NOVEL CORONAVIRUS, NAA: SARS-CoV-2, NAA: NOT DETECTED

## 2018-10-30 DIAGNOSIS — J301 Allergic rhinitis due to pollen: Secondary | ICD-10-CM | POA: Diagnosis not present

## 2018-11-01 DIAGNOSIS — J301 Allergic rhinitis due to pollen: Secondary | ICD-10-CM | POA: Diagnosis not present

## 2018-11-04 DIAGNOSIS — G4700 Insomnia, unspecified: Secondary | ICD-10-CM | POA: Diagnosis not present

## 2018-11-04 DIAGNOSIS — Z299 Encounter for prophylactic measures, unspecified: Secondary | ICD-10-CM | POA: Diagnosis not present

## 2018-11-04 DIAGNOSIS — E538 Deficiency of other specified B group vitamins: Secondary | ICD-10-CM | POA: Diagnosis not present

## 2018-11-04 DIAGNOSIS — J301 Allergic rhinitis due to pollen: Secondary | ICD-10-CM | POA: Diagnosis not present

## 2018-11-04 DIAGNOSIS — Z6831 Body mass index (BMI) 31.0-31.9, adult: Secondary | ICD-10-CM | POA: Diagnosis not present

## 2018-11-04 DIAGNOSIS — Z713 Dietary counseling and surveillance: Secondary | ICD-10-CM | POA: Diagnosis not present

## 2018-11-06 ENCOUNTER — Encounter: Payer: Medicare Other | Admitting: Neurology

## 2018-11-06 ENCOUNTER — Other Ambulatory Visit: Payer: Self-pay

## 2018-11-06 ENCOUNTER — Telehealth: Payer: Self-pay | Admitting: Neurology

## 2018-11-06 ENCOUNTER — Telehealth: Payer: Self-pay

## 2018-11-06 DIAGNOSIS — J301 Allergic rhinitis due to pollen: Secondary | ICD-10-CM | POA: Diagnosis not present

## 2018-11-06 NOTE — Telephone Encounter (Signed)
Patient arrived at the office today for her NCV/EMG. I happened to notice the Active FYI regarding possible COVID19 exposure and researched this further. Per telephone note on 10/26/18, she was possibly exposed to a [employer] positive employee at her 10/24/18 visit to the orthopaedic office and was asked if she would mind being tested and pt agreed. The test was done on 10/28/18 and came back "Not Detected." Pt is not having symptoms and not running a fever. I spoke with Dr. Krista Blue and due to the fact that she is still within the 14 days of exposure, Dr. Krista Blue did not feel that it was safe for her or I to be in such close contact for such a long period of time. At this time, patient was waiting in the lobby. I went and spoke with the patient, accompanied by Shirlean Mylar, sleep lab manager, and explained all of the above to patient. The patient was not pleased and asked why she was not called and made aware of this sooner? I apologized and explained that we did notice her COVID19 alert until this morning. I advised patient that I would call her later today to get this visit rescheduled. Dr. Krista Blue will also call the patient to apologize and explain.

## 2018-11-06 NOTE — Telephone Encounter (Addendum)
I have called patient, the EMG/NCS was scheduled on May 11, she reported potential COVID 19 exposure on May 18th 2020.  She is asymptomatic now.  She had history of lumbar decompression surgery in 2016 at [place], now has recurrent low back, radiating pain to left thigh, left leg, worsening paresthesia.  I personally reviewed CT lumbar on Jul 11 2018: posterior lumbar fusion from L4-S1, at L2 and 3, there is a broad based disc bulging, severe bilateral facet arthropathy, moderate to severe spinal stenosis, there was no acute osseous injury of the lumbar spine  Also reviewed MRI lumbar report in Feb 2017, interval bilateral L5 laminectomy and partial L4 S1 laminotomy, with decompression of the spinal canal between L4, and S1, L5-S1 interbody fusion with restoration of normal lordosis, and posterior instrumentation and fusion with pedicle screw at L4-L5, and S1 joints by vertical rods.  L3-4 spondylosis, facet arthropathy with moderate spinal stenosis,  L2-L3, interval shallow left lateral and foraminal disc herniation, and moderate spinal stenosis  Patient understands that she has to be in quarantine for 2 weeks, MRI of lumbar is scheduled by her orthopedics Dr. Marlou Sa.  We will reschedule her EMG/NCS appointment. She will get previous record for comparison

## 2018-11-07 ENCOUNTER — Encounter: Payer: Self-pay | Admitting: Neurology

## 2018-11-07 NOTE — Telephone Encounter (Signed)
I called and spoke with patient and she has been rescheduled to June 22nd.

## 2018-11-08 DIAGNOSIS — J301 Allergic rhinitis due to pollen: Secondary | ICD-10-CM | POA: Diagnosis not present

## 2018-11-11 DIAGNOSIS — J301 Allergic rhinitis due to pollen: Secondary | ICD-10-CM | POA: Diagnosis not present

## 2018-11-13 DIAGNOSIS — J301 Allergic rhinitis due to pollen: Secondary | ICD-10-CM | POA: Diagnosis not present

## 2018-11-15 DIAGNOSIS — J301 Allergic rhinitis due to pollen: Secondary | ICD-10-CM | POA: Diagnosis not present

## 2018-11-18 ENCOUNTER — Other Ambulatory Visit: Payer: Self-pay

## 2018-11-18 ENCOUNTER — Ambulatory Visit (INDEPENDENT_AMBULATORY_CARE_PROVIDER_SITE_OTHER): Payer: Medicare Other | Admitting: Neurology

## 2018-11-18 ENCOUNTER — Telehealth: Payer: Self-pay | Admitting: Neurology

## 2018-11-18 ENCOUNTER — Encounter (INDEPENDENT_AMBULATORY_CARE_PROVIDER_SITE_OTHER): Payer: Medicare Other | Admitting: Neurology

## 2018-11-18 DIAGNOSIS — R202 Paresthesia of skin: Secondary | ICD-10-CM | POA: Diagnosis not present

## 2018-11-18 DIAGNOSIS — M5416 Radiculopathy, lumbar region: Secondary | ICD-10-CM

## 2018-11-18 DIAGNOSIS — Z0289 Encounter for other administrative examinations: Secondary | ICD-10-CM

## 2018-11-18 DIAGNOSIS — J301 Allergic rhinitis due to pollen: Secondary | ICD-10-CM | POA: Diagnosis not present

## 2018-11-18 NOTE — Telephone Encounter (Signed)
I reviewed [facility] neurology group evaluation on July 19, 2015, there was a suggestion of mild left lumbar neuropathy  MRI of lumbar spine on May 19, suggestive of a chronic left lumbar radiculopathy, peripheral neuropathy.  Stable post left L5 laminectomy, and midline paramedian far lateral discectomy, with continuously enhancing granulation tissues, epidural fibrosis encasing the existing left L5 nerve roots, no evidence of residual or recurrent disc herniation at L5-S1,  EMG nerve conduction study suggestive of a mild chronic left lumbar radiculopathy, there is no evidence of large fiber peripheral neuropathy

## 2018-11-18 NOTE — Procedures (Signed)
Full Name: Patricia Eaton Gender: Female MRN #: [id number] Date of Birth: August 23, 1950    Visit Date: 11/18/2018 08:40 Age: [age] Years 5 Months Old Examining Physician: Levert FeinsteinYijun Taft Worthing, MD  Referring Physician: Levert FeinsteinYijun Skylee Baird, MD History: 68 years old female with history of lumbar decompression surgery, presented with low back pain, radiating pain to left lower extremity  Summary of the tests:  Nerve conduction study:  Bilateral sural sensory responses were normal.  Bilateral superficial peroneal sensory responses showed borderline prolonged peak latency with borderline snap amplitude.   Bilateral tibial, right peroneal to EDB motor responses were normal.  Left peroneal to EDB motor response showed mildly prolonged distal latency, with mildly slow conduction velocity.  Electromyography: Selected needle examinations were performed at left lower extremity muscles and left lumbosacral paraspinal muscles.  There was no significant abnormality noted.   Conclusion: This is essentially a normal study.  There is no evidence of peripheral neuropathy, or active lumbosacral radiculopathy.    ------------------------------- Levert FeinsteinYIjun Zakayla Martinec, M.D. PhD  [facility] [address] [place], [place] [address] Tel: [phone] Fax: [phone]        [facility]    Nerve / Sites Muscle Latency Ref. Amplitude Ref. Rel Amp Segments Distance Velocity Ref. Area    ms ms mV mV %  cm m/s m/s mVms  R Peroneal - EDB     Ankle EDB 5.2 ?6.5 5.8 ?2.0 100 Ankle - EDB 9   21.9     Fib head EDB 13.0  5.3  91.5 Fib head - Ankle 30 39 ?44 20.6     Pop fossa EDB 15.5  5.0  94.2 Pop fossa - Fib head 10 39 ?44 20.2         Pop fossa - Ankle      L Peroneal - EDB     Ankle EDB 7.0 ?6.5 3.9 ?2.0 100 Ankle - EDB 9   14.8     Fib head EDB 14.6  3.4  86.5 Fib head - Ankle 30 40 ?44 14.2     Pop fossa EDB 17.1  3.2  94 Pop fossa - Fib head 10 39 ?44 14.3         Pop fossa - Ankle      R Tibial - AH   Ankle AH 4.2 ?5.8 7.8 ?4.0 100 Ankle - AH 9   18.3     Pop fossa AH 14.2  5.9  75.7 Pop fossa - Ankle 39 39 ?41 14.1  L Tibial - AH     Ankle AH 4.6 ?5.8 9.5 ?4.0 100 Ankle - AH 9   23.4     Pop fossa AH 15.1  6.2  65.4 Pop fossa - Ankle 38 36 ?41 17.3             SNC    Nerve / Sites Rec. Site Peak Lat Ref.  Amp Ref. Segments Distance    ms ms V V  cm  R Sural - Ankle (Calf)     Calf Ankle 3.9 ?4.4 6 ?6 Calf - Ankle 14  L Sural - Ankle (Calf)     Calf Ankle 4.1 ?4.4 8 ?6 Calf - Ankle 14  R Superficial peroneal - Ankle     Lat leg Ankle 4.6 ?4.4 6 ?6 Lat leg - Ankle 14  L Superficial peroneal - Ankle     Lat leg Ankle 4.5 ?4.4 5 ?6 Lat leg - Ankle 14  F  Wave    Nerve F Lat Ref.   ms ms  R Peroneal - EDB 56.5 ?56.0  R Tibial - AH 56.6 ?56.0  L Peroneal - EDB 54.8 ?56.0  L Tibial - AH 62.0 ?56.0             H Reflex    Nerve H Lat   ms   Left Right Ref.  Tibial - Soleus 41.2 37.1 ?35.0         EMG       EMG Summary Table    Spontaneous MUAP Recruitment  Muscle IA Fib PSW Fasc Other Amp Dur. Poly Pattern  L. Tibialis anterior Normal None None None _______ Normal Normal Normal Normal  L. Tibialis posterior Normal None None None _______ Normal Normal Normal Normal  L. Peroneus longus Normal None None None _______ Normal Normal Normal Normal  L. Vastus lateralis Normal None None None _______ Normal Normal Normal Normal  L. Biceps femoris (long head) Normal None None None _______ Normal Normal Normal Normal  L. Lumbar paraspinals (mid) Normal None None None _______ Normal Normal Normal Normal  L. Lumbar paraspinals (low) Normal None None None _______ Normal Normal Normal Normal

## 2018-11-23 ENCOUNTER — Other Ambulatory Visit: Payer: Self-pay

## 2018-11-23 ENCOUNTER — Ambulatory Visit
Admission: RE | Admit: 2018-11-23 | Discharge: 2018-11-23 | Disposition: A | Discharge status: Home / Self Care | Payer: Medicare Other | Source: Ambulatory Visit | Attending: Orthopedic Surgery | Admitting: Orthopedic Surgery

## 2018-11-23 DIAGNOSIS — M5124 Other intervertebral disc displacement, thoracic region: Secondary | ICD-10-CM | POA: Diagnosis not present

## 2018-11-23 DIAGNOSIS — M549 Dorsalgia, unspecified: Secondary | ICD-10-CM

## 2018-11-27 ENCOUNTER — Encounter: Payer: Self-pay | Admitting: Orthopedic Surgery

## 2018-11-27 ENCOUNTER — Ambulatory Visit (INDEPENDENT_AMBULATORY_CARE_PROVIDER_SITE_OTHER): Payer: Medicare Other | Admitting: Orthopedic Surgery

## 2018-11-27 ENCOUNTER — Other Ambulatory Visit: Payer: Self-pay

## 2018-11-27 DIAGNOSIS — Z299 Encounter for prophylactic measures, unspecified: Secondary | ICD-10-CM | POA: Diagnosis not present

## 2018-11-27 DIAGNOSIS — I471 Supraventricular tachycardia: Secondary | ICD-10-CM | POA: Diagnosis not present

## 2018-11-27 DIAGNOSIS — Z6831 Body mass index (BMI) 31.0-31.9, adult: Secondary | ICD-10-CM | POA: Diagnosis not present

## 2018-11-27 DIAGNOSIS — E538 Deficiency of other specified B group vitamins: Secondary | ICD-10-CM | POA: Diagnosis not present

## 2018-11-27 DIAGNOSIS — J301 Allergic rhinitis due to pollen: Secondary | ICD-10-CM | POA: Diagnosis not present

## 2018-11-27 DIAGNOSIS — M549 Dorsalgia, unspecified: Secondary | ICD-10-CM

## 2018-11-27 DIAGNOSIS — Z713 Dietary counseling and surveillance: Secondary | ICD-10-CM | POA: Diagnosis not present

## 2018-11-27 NOTE — Addendum Note (Signed)
Addended byLaurann Montana on: 11/27/2018 12:36 PM   Modules accepted: Orders

## 2018-11-27 NOTE — Progress Notes (Signed)
Office Visit Note   Patient: Patricia Eaton           Date of Birth: 20-Sep-1950           MRN: [id number] Visit Date: 11/27/2018 Requested by: Kirstie PeriShah, Ashish, MD [address] [place],  [place] [id number] PCP: Kirstie PeriShah, Ashish, MD  Subjective: Chief Complaint  Patient presents with  . Follow-up    review scan    HPI: Is a patient follows up after thoracic spine MRI scan.  She is having some midthoracic back pain.  Scan was fairly normal in that regard.  Does look like she has a mild cervical disc herniation at C6-7 but she is having no real neck pain no radicular symptoms.  She is having some low back pain also.  That was initially treated by surgeons up in [place].  She would like to have her nerve study repeated because where it was done here she felt like the neurologist did not really do it in the way that she was used to having it done in [place].  She is requesting another evaluation for repeat nerve conduction study on her lower extremities for that reason.  She also has a history of bariatric surgery and would like to see a bariatric surgeon here.  Not having any particular issues but she was told she needed follow-up in that regard.              ROS: All systems reviewed are negative as they relate to the chief complaint within the history of present illness.  Patient denies  fevers or chills.   Assessment & Plan: Visit Diagnoses:  1. Upper back pain     Plan: Impression is upper back pain with normal thoracic spine MRI.  I do not think she has any type of disc pathology and in the thoracic spine.  I think that something that we can work-up further down the road if needed.  Could be compensated axial pain from her lumbar spine problems.  I do not think that the cervical disc in her neck is necessarily giving her those symptoms.  Nothing really palpable in that region.  I will get her set up with the referral to another neurologist per patient request as well as to bariatric  surgeon and follow-up with me as needed  Follow-Up Instructions: No follow-ups on file.   Orders:  No orders of the defined types were placed in this encounter.  No orders of the defined types were placed in this encounter.     Procedures: No procedures performed   Clinical Data: No additional findings.  Objective: Vital Signs: There were no vitals taken for this visit.  Physical Exam:   Constitutional: Patient appears well-developed HEENT:  Head: Normocephalic Eyes:EOM are normal Neck: Normal range of motion Cardiovascular: Normal rate Pulmonary/chest: Effort normal Neurologic: Patient is alert Skin: Skin is warm Psychiatric: Patient has normal mood and affect    Ortho Exam: Ortho exam demonstrates pretty normal gait alignment.  Some pain within that lumbar and thoracic spine area to palpation.  Well-healed surgical incision in the back.  She has some type of incision in the upper mid thoracic area from her prior surgery.  Motor sensory function of the upper extremities intact.  Gait is normal.  Rest of her exam is unchanged  Specialty Comments:  No specialty comments available.  Imaging: No results found.   PMFS History: Patient Active Problem List   Diagnosis Date Noted  . Paresthesia  06/13/2018  . Lumbar radiculopathy 06/13/2018  . Abdominal pain 03/07/2018  . Nausea with vomiting 12/04/2017  . Constipation 08/29/2017  . History of diverticulitis 08/29/2017  . GERD (gastroesophageal reflux disease) 08/29/2017   Past Medical History:  Diagnosis Date  . Anxiety   . Colonic mass    In [place], found inconclusive  . GERD (gastroesophageal reflux disease)   . Neuropathy   . Numbness   . Pulmonary embolism ([place])   . Seasonal allergies     Family History  Problem Relation Age of Onset  . Colon cancer Maternal Aunt   . Crohn's disease Grandchild   . Crohn's disease Son   . Cancer Mother        kidney or liver  . Heart failure Father   . Heart attack  Father   . Pancreatic cancer Sister   . Stroke Maternal Grandmother     Past Surgical History:  Procedure Laterality Date  . APPENDECTOMY    . ESOPHAGOGASTRODUODENOSCOPY N/A 01/09/2018   Procedure: ESOPHAGOGASTRODUODENOSCOPY (EGD);  Surgeon: Daneil Dolin, MD;  Location: [facility];  Service: Endoscopy;  Laterality: N/A;  1:00pm  . GASTRIC BYPASS  2003  . LUMBAR FUSION  2016  . PARTIAL HYSTERECTOMY  1980  . WRIST FRACTURE SURGERY  2013   Social History   Occupational History  . Occupation: Retired from trucking  Tobacco Use  . Smoking status: Former Smoker    Types: Cigarettes    Quit date: 05/29/1997    Years since quitting: 21.5  . Smokeless tobacco: Never Used  Substance and Sexual Activity  . Alcohol use: Yes    Comment: occasional  . Drug use: Never  . Sexual activity: Not on file

## 2018-11-29 DIAGNOSIS — J301 Allergic rhinitis due to pollen: Secondary | ICD-10-CM | POA: Diagnosis not present

## 2018-12-01 DIAGNOSIS — L259 Unspecified contact dermatitis, unspecified cause: Secondary | ICD-10-CM | POA: Diagnosis not present

## 2018-12-02 DIAGNOSIS — J301 Allergic rhinitis due to pollen: Secondary | ICD-10-CM | POA: Diagnosis not present

## 2018-12-04 ENCOUNTER — Telehealth: Payer: Self-pay | Admitting: Neurology

## 2018-12-04 DIAGNOSIS — J301 Allergic rhinitis due to pollen: Secondary | ICD-10-CM | POA: Diagnosis not present

## 2018-12-04 NOTE — Telephone Encounter (Signed)
You saw this pt January 2020 and did a NCV/EMG in June 2020. She is now being referred back and wants to switch to Dr Jannifer Franklin. Is it ok to switch?

## 2018-12-04 NOTE — Telephone Encounter (Signed)
OK with me for switch

## 2018-12-04 NOTE — Telephone Encounter (Signed)
It is OK to switch to Dr. Willis 

## 2018-12-04 NOTE — Telephone Encounter (Signed)
This is a pt of Dr Krista Blue and is being referred back to [facility] she would like to see you. Is it ok to switch?

## 2018-12-06 DIAGNOSIS — J301 Allergic rhinitis due to pollen: Secondary | ICD-10-CM | POA: Diagnosis not present

## 2018-12-09 DIAGNOSIS — J301 Allergic rhinitis due to pollen: Secondary | ICD-10-CM | POA: Diagnosis not present

## 2018-12-11 DIAGNOSIS — J301 Allergic rhinitis due to pollen: Secondary | ICD-10-CM | POA: Diagnosis not present

## 2018-12-13 DIAGNOSIS — J301 Allergic rhinitis due to pollen: Secondary | ICD-10-CM | POA: Diagnosis not present

## 2018-12-16 DIAGNOSIS — J301 Allergic rhinitis due to pollen: Secondary | ICD-10-CM | POA: Diagnosis not present

## 2018-12-17 DIAGNOSIS — R21 Rash and other nonspecific skin eruption: Secondary | ICD-10-CM | POA: Diagnosis not present

## 2018-12-17 DIAGNOSIS — Z6831 Body mass index (BMI) 31.0-31.9, adult: Secondary | ICD-10-CM | POA: Diagnosis not present

## 2018-12-17 DIAGNOSIS — I471 Supraventricular tachycardia: Secondary | ICD-10-CM | POA: Diagnosis not present

## 2018-12-17 DIAGNOSIS — Z299 Encounter for prophylactic measures, unspecified: Secondary | ICD-10-CM | POA: Diagnosis not present

## 2018-12-20 DIAGNOSIS — J301 Allergic rhinitis due to pollen: Secondary | ICD-10-CM | POA: Diagnosis not present

## 2018-12-23 DIAGNOSIS — J301 Allergic rhinitis due to pollen: Secondary | ICD-10-CM | POA: Diagnosis not present

## 2018-12-25 DIAGNOSIS — J301 Allergic rhinitis due to pollen: Secondary | ICD-10-CM | POA: Diagnosis not present

## 2018-12-27 DIAGNOSIS — J301 Allergic rhinitis due to pollen: Secondary | ICD-10-CM | POA: Diagnosis not present

## 2018-12-30 DIAGNOSIS — Z6830 Body mass index (BMI) 30.0-30.9, adult: Secondary | ICD-10-CM | POA: Diagnosis not present

## 2018-12-30 DIAGNOSIS — Z299 Encounter for prophylactic measures, unspecified: Secondary | ICD-10-CM | POA: Diagnosis not present

## 2018-12-30 DIAGNOSIS — J301 Allergic rhinitis due to pollen: Secondary | ICD-10-CM | POA: Diagnosis not present

## 2018-12-30 DIAGNOSIS — G4700 Insomnia, unspecified: Secondary | ICD-10-CM | POA: Diagnosis not present

## 2018-12-30 DIAGNOSIS — E538 Deficiency of other specified B group vitamins: Secondary | ICD-10-CM | POA: Diagnosis not present

## 2018-12-30 DIAGNOSIS — Z713 Dietary counseling and surveillance: Secondary | ICD-10-CM | POA: Diagnosis not present

## 2019-01-01 DIAGNOSIS — J301 Allergic rhinitis due to pollen: Secondary | ICD-10-CM | POA: Diagnosis not present

## 2019-01-03 DIAGNOSIS — J301 Allergic rhinitis due to pollen: Secondary | ICD-10-CM | POA: Diagnosis not present

## 2019-01-06 DIAGNOSIS — J301 Allergic rhinitis due to pollen: Secondary | ICD-10-CM | POA: Diagnosis not present

## 2019-01-08 ENCOUNTER — Encounter: Payer: Self-pay | Admitting: Gastroenterology

## 2019-01-08 DIAGNOSIS — J301 Allergic rhinitis due to pollen: Secondary | ICD-10-CM | POA: Diagnosis not present

## 2019-01-08 NOTE — Progress Notes (Signed)
Referring Provider: Kirstie PeriShah, Ashish, MD Primary Care Physician:  Kirstie PeriShah, Ashish, MD  Primary GI: Dr. Jena Gaussourk   Chief Complaint  Patient presents with  . Abdominal Pain    mucous/bloody stool; feels she has diverticulitis    HPI:   Patricia Eaton is a 68 y.o. female presenting today with a history of diverticulitis, constipation, GERD.   Several years ago had last spell of diverticulitis in [place]. Last Thursday onset of symptoms: lower abdominal pain, constant. Sharp pain. Yesterday chills. Afebrile today. Yesterday evening sweating to death. Yesterday freezing. Associated nausea. Frequent urination but no dysuria. History of chronic UTIs, now on daily prophylaxis.   Backed down to yogurt bland foods. Yesterday had bowel movements all day, not diarrhea. Now with bloody mucus.    Also notes history of gastric bypass. Has had to worry about dumping syndrome in the past. Now  Chronically constipated. Stool comes out mushy. Miralax doesn't help. No prescriptive agents. BM every other day. Feels unproductive. Concerned because sister died of pancreatic cancer and also had issues with diverticulitis. Great Aunt with stomach cancer. Patient states when she had an appendectomy, she had a "tumor" but pathology was inconclusive.   01/09/2018 which found normal esophagus, normal residual stomach status post gastric obesity procedure.  Past Medical History:  Diagnosis Date  . Anxiety   . Colonic mass    In [place], found inconclusive  . GERD (gastroesophageal reflux disease)   . Neuropathy   . Numbness   . Pulmonary embolism ([facility])   . Seasonal allergies     Past Surgical History:  Procedure Laterality Date  . APPENDECTOMY    . COLONOSCOPY  2018   [place]: diverticulosis  . ESOPHAGOGASTRODUODENOSCOPY N/A 01/09/2018   normal esophagus, normal residual stomach status post gastric obesity procedure.  Marland Kitchen. GASTRIC BYPASS  2003  . LUMBAR FUSION  2016  . PARTIAL HYSTERECTOMY  1980   . WRIST FRACTURE SURGERY  2013    Current Outpatient Medications  Medication Sig Dispense Refill  . aspirin EC 81 MG tablet Take 81 mg by mouth daily.    Marland Kitchen. CALCIUM PO Take 500 mg by mouth daily.     . cholecalciferol (VITAMIN D) 1000 units tablet Take 5,000 Units by mouth daily.     . cyanocobalamin (,VITAMIN B-12,) 1000 MCG/ML injection Inject 1,000 mcg into the muscle every 30 (thirty) days.  0  . ferrous sulfate 325 (65 FE) MG tablet Take 325 mg by mouth daily with breakfast.    . fluticasone (FLONASE) 50 MCG/ACT nasal spray Place 2 sprays into both nostrils daily.    Marland Kitchen. omeprazole (PRILOSEC) 40 MG capsule TAKE 1 CAPSULE BY MOUTH TWICE A DAY (Patient taking differently: Take 40 mg by mouth daily. ) 180 capsule 2  . pregabalin (LYRICA) 200 MG capsule Take 200 mg by mouth daily.     Marland Kitchen. PRESCRIPTION MEDICATION Allergy shot three times weekly    . tiZANidine (ZANAFLEX) 4 MG tablet Take 1 tablet by mouth daily as needed.    . trimethoprim (TRIMPEX) 100 MG tablet Take 100 mg by mouth daily.    . ciprofloxacin (CIPRO) 500 MG tablet Take 1 tablet (500 mg total) by mouth 2 (two) times daily for 7 days. 14 tablet 0  . metroNIDAZOLE (FLAGYL) 500 MG tablet Take 1 tablet (500 mg total) by mouth 3 (three) times daily for 7 days. 21 tablet 0   No current facility-administered medications for this visit.  Allergies as of 01/09/2019 - Review Complete 01/09/2019  Allergen Reaction Noted  . Other  01/09/2019    Family History  Problem Relation Age of Onset  . Colon cancer Maternal Aunt   . Crohn's disease Grandchild   . Crohn's disease Son   . Cancer Mother        kidney or liver  . Heart failure Father   . Heart attack Father   . Pancreatic cancer Sister   . Stroke Maternal Grandmother     Social History   Socioeconomic History  . Marital status: Married    Spouse name: Not on file  . Number of children: 3  . Years of education: 62  . Highest education level: High school graduate   Occupational History  . Occupation: Retired from trucking  Social Needs  . Financial resource strain: Not on file  . Food insecurity    Worry: Not on file    Inability: Not on file  . Transportation needs    Medical: Not on file    Non-medical: Not on file  Tobacco Use  . Smoking status: Former Smoker    Types: Cigarettes    Quit date: 05/29/1997    Years since quitting: 21.6  . Smokeless tobacco: Never Used  Substance and Sexual Activity  . Alcohol use: Yes    Comment: occasional  . Drug use: Never  . Sexual activity: Not on file  Lifestyle  . Physical activity    Days per week: Not on file    Minutes per session: Not on file  . Stress: Not on file  Relationships  . Social Herbalist on phone: Not on file    Gets together: Not on file    Attends religious service: Not on file    Active member of club or organization: Not on file    Attends meetings of clubs or organizations: Not on file    Relationship status: Not on file  Other Topics Concern  . Not on file  Social History Narrative   Lives at home husband.   Right-handed.   2 cups per day.     Review of Systems: See HPI  Physical Exam: BP 121/80   Pulse (!) 101   Temp 98.4 F (36.9 C) (Temporal)   Ht 5\' 7"  (1.702 m)   Wt 193 lb 6.4 oz (87.7 kg)   BMI 30.29 kg/m  General:   Alert and oriented. No distress noted. Pleasant and cooperative.  Head:  Normocephalic and atraumatic. Eyes:  Conjuctiva clear without scleral icterus. Mouth:  Oral mucosa pink and moist.  Abdomen:  +BS, soft, periumbilical abdominal pain and non-distended. No rebound or guarding. No HSM or masses noted. Msk:  Symmetrical without gross deformities. Normal posture. Extremities:  Without edema. Neurologic:  Alert and  oriented x4 Psych:  Alert and cooperative. Normal mood and affect.

## 2019-01-09 ENCOUNTER — Ambulatory Visit (INDEPENDENT_AMBULATORY_CARE_PROVIDER_SITE_OTHER): Payer: Medicare Other | Admitting: Gastroenterology

## 2019-01-09 ENCOUNTER — Other Ambulatory Visit: Payer: Self-pay

## 2019-01-09 ENCOUNTER — Ambulatory Visit (HOSPITAL_COMMUNITY)
Admission: RE | Admit: 2019-01-09 | Discharge: 2019-01-09 | Disposition: A | Discharge status: Home / Self Care | Payer: Medicare Other | Source: Ambulatory Visit | Attending: Gastroenterology | Admitting: Gastroenterology

## 2019-01-09 ENCOUNTER — Encounter: Payer: Self-pay | Admitting: Gastroenterology

## 2019-01-09 VITALS — BP 121/80 | HR 101 | Temp 98.4°F | Ht 67.0 in | Wt 193.4 lb

## 2019-01-09 DIAGNOSIS — K5900 Constipation, unspecified: Secondary | ICD-10-CM

## 2019-01-09 DIAGNOSIS — R1030 Lower abdominal pain, unspecified: Secondary | ICD-10-CM

## 2019-01-09 DIAGNOSIS — K449 Diaphragmatic hernia without obstruction or gangrene: Secondary | ICD-10-CM | POA: Diagnosis not present

## 2019-01-09 DIAGNOSIS — K5732 Diverticulitis of large intestine without perforation or abscess without bleeding: Secondary | ICD-10-CM | POA: Diagnosis not present

## 2019-01-09 LAB — POCT I-STAT CREATININE: Creatinine, Ser: 0.8 mg/dL (ref 0.44–1.00)

## 2019-01-09 MED ORDER — METRONIDAZOLE 500 MG PO TABS: 500.0000 mg | ORAL_TABLET | Freq: Three times a day (TID) | ORAL | 0 refills | Status: AC

## 2019-01-09 MED ORDER — IOHEXOL 300 MG/ML  SOLN
100.0000 mL | Freq: Once | INTRAMUSCULAR | Status: AC | PRN
  Administered 2019-01-09: 100 mL via INTRAVENOUS

## 2019-01-09 MED ORDER — CIPROFLOXACIN HCL 500 MG PO TABS: 500.0000 mg | ORAL_TABLET | Freq: Two times a day (BID) | ORAL | 0 refills | Status: AC

## 2019-01-09 MED ORDER — BARIUM SULFATE 2.1 % PO SUSP
ORAL | Status: AC
  Filled 2019-01-09: qty 2

## 2019-01-09 NOTE — Assessment & Plan Note (Signed)
Concerning for diverticulitis. Known diverticulosis. Small amount of mucus, bloody stool likely related to benign anorectal source in setting of multiple BMs (no diarrhea). CT scan abd/pelvis today stat. Cipro and Flagyl on hold at pharmacy. Avoid Zanaflex while on antibiotics. Follow soft diet, then advance to high fiber as tolerated.

## 2019-01-09 NOTE — Patient Instructions (Addendum)
We are scheduling a CT scan today to further evaluate your symptoms. I have sent in antibiotics to your pharmacy in case you will need this. Don't take Zanaflex while on antibiotics as they interact together.  For the next few days, until you start feeling better, follow a soft diet (see handout). You can advance to high fiber once you are feeling better.  After this acute illness is over, I recommend Benefiber 1 teaspoon to 3 teaspoons daily to help with productive bowel movements.  We will see you in 2 months or sooner if needed!  It was a pleasure to see you today. I want to create trusting relationships with patients to provide genuine, compassionate, and quality care. I value your feedback. If you receive a survey regarding your visit,  I greatly appreciate you taking time to fill this out.   Annitta Needs, PhD, ANP-BC [facility]

## 2019-01-09 NOTE — Assessment & Plan Note (Signed)
Chronic constipation. Once over acute illness, recommend supplemental fiber (Benefiber) daily. I feel this will likely help her symptoms overall. If necessary, could add Linzess 72 mcg in future. Follow-up as planned.

## 2019-01-10 ENCOUNTER — Ambulatory Visit: Payer: Medicare Other | Admitting: Gastroenterology

## 2019-01-10 DIAGNOSIS — J301 Allergic rhinitis due to pollen: Secondary | ICD-10-CM | POA: Diagnosis not present

## 2019-01-14 NOTE — Progress Notes (Signed)
cc'ed to pcp °

## 2019-01-15 DIAGNOSIS — J301 Allergic rhinitis due to pollen: Secondary | ICD-10-CM | POA: Diagnosis not present

## 2019-01-17 DIAGNOSIS — J301 Allergic rhinitis due to pollen: Secondary | ICD-10-CM | POA: Diagnosis not present

## 2019-01-21 DIAGNOSIS — J301 Allergic rhinitis due to pollen: Secondary | ICD-10-CM | POA: Diagnosis not present

## 2019-01-23 ENCOUNTER — Other Ambulatory Visit: Payer: Self-pay

## 2019-01-23 ENCOUNTER — Encounter: Payer: Self-pay | Admitting: Neurology

## 2019-01-23 ENCOUNTER — Ambulatory Visit (INDEPENDENT_AMBULATORY_CARE_PROVIDER_SITE_OTHER): Payer: Medicare Other | Admitting: Neurology

## 2019-01-23 VITALS — BP 114/85 | HR 77 | Temp 97.8°F | Ht 67.0 in | Wt 198.0 lb

## 2019-01-23 DIAGNOSIS — R202 Paresthesia of skin: Secondary | ICD-10-CM

## 2019-01-23 DIAGNOSIS — M5416 Radiculopathy, lumbar region: Secondary | ICD-10-CM

## 2019-01-23 MED ORDER — PREGABALIN 100 MG PO CAPS: ORAL_CAPSULE | ORAL | 1 refills | Status: DC

## 2019-01-23 NOTE — Patient Instructions (Signed)
We will increase the lyrica to 100 mg in the morning and 200 mg in the evening.  Lyrica (pregabalin) may cause drowsiness, gait instability, ankle swelling, or cognitive slowing as well as possible dizziness. If any significant side effects occur associated with this medication, please contact our office.

## 2019-01-23 NOTE — Progress Notes (Addendum)
Reason for visit: Paresthesias  Patricia Eaton is a 68 y.o. female  History of present illness:  Patricia Eaton is a 68 year old right-handed white female with a history of multiple lumbosacral spine surgeries done, the last procedure was in 2016.  Since that time, she has had some daily but intermittent numbness and discomfort sensations involving primarily the left foot, this involves the outside of the foot and sometimes go up the leg to the knee.  She does have some back pain, occasionally she will have spasms in the left thigh.  She has minimal problems with the right foot.  She does have some neck pain off and on as well.  The neck pain is fairly minimal.  She denies weakness of the extremities, she reports some slight balance issues, she does not have to use an assistive device to walk.  She is on Lyrica taking 200 mg only at nighttime as the symptoms tend to be worse at night.  If she is particularly active during the day some of her pain and discomfort may worsen.  She denies issues controlling the bowels or the bladder.  She has undergone a CT scan evaluation of the cervical and lumbar spines, moderate to severe spinal stenosis was noted at the L 2-3 level.  She has multilevel spondylosis in the neck and low back.  She did undergo MRI of the thoracic spine that was relatively unremarkable.  She returns to this office for further evaluation.  She has had prior nerve conduction studies and EMG evaluation that did not show evidence of a peripheral neuropathy or an active lumbosacral radiculopathy involving the left leg.  Past Medical History:  Diagnosis Date  . Anxiety   . Colonic mass    In [place], found inconclusive  . Environmental allergies    trees, grass, oak trees  . GERD (gastroesophageal reflux disease)   . Neuropathy   . Numbness   . Pulmonary embolism ([place])   . Seasonal allergies     Past Surgical History:  Procedure Laterality Date  . APPENDECTOMY    .  COLONOSCOPY  2018   [place]: diverticulosis  . ESOPHAGOGASTRODUODENOSCOPY N/A 01/09/2018   normal esophagus, normal residual stomach status post gastric obesity procedure.  Marland Kitchen GASTRIC BYPASS  2003  . LUMBAR FUSION  2016  . PARTIAL HYSTERECTOMY  1980  . WRIST FRACTURE SURGERY  2013    Family History  Problem Relation Age of Onset  . Colon cancer Maternal Aunt   . Crohn's disease Grandchild   . Crohn's disease Son   . Cancer Mother        kidney or liver  . Heart failure Father   . Heart attack Father   . Pancreatic cancer Sister   . Stroke Maternal Grandmother     Social history:  reports that she quit smoking about 21 years ago. Her smoking use included cigarettes. She has never used smokeless tobacco. She reports current alcohol use. She reports that she does not use drugs.  Medications:  Prior to Admission medications   Medication Sig Start Date End Date Taking? Authorizing Provider  aspirin EC 81 MG tablet Take 81 mg by mouth daily.   Yes [provider]  CALCIUM PO Take 500 mg by mouth daily.    Yes [provider]  cholecalciferol (VITAMIN D) 1000 units tablet Take 5,000 Units by mouth daily.    Yes [provider]  cyanocobalamin (,VITAMIN B-12,) 1000 MCG/ML injection Inject 1,000 mcg into  the muscle every 30 (thirty) days. 12/26/17  Yes [provider]  ferrous sulfate 325 (65 FE) MG tablet Take 325 mg by mouth daily with breakfast.   Yes [provider]  fluticasone (FLONASE) 50 MCG/ACT nasal spray Place 2 sprays into both nostrils daily. 06/20/18  Yes [provider]  omeprazole (PRILOSEC) 40 MG capsule TAKE 1 CAPSULE BY MOUTH TWICE A DAY Patient taking differently: Take 40 mg by mouth daily.  05/15/18  Yes Anice Paganini, NP  pregabalin (LYRICA) 200 MG capsule Take 200 mg by mouth daily.    Yes [provider]  PRESCRIPTION MEDICATION Allergy shot three times weekly   Yes [provider]  tiZANidine  (ZANAFLEX) 4 MG tablet Take 1 tablet by mouth daily as needed. 07/11/18  Yes [provider]  trimethoprim (TRIMPEX) 100 MG tablet Take 100 mg by mouth daily. 07/05/18  Yes [provider]      Allergies  Allergen Reactions  . Molds & Smuts     And grass  . Other     Oak causes rash    ROS:  Out of a complete 14 system review of symptoms, the patient complains only of the following symptoms, and all other reviewed systems are negative.  Numbness Leg pain, cramps  Blood pressure 114/85, pulse 77, temperature 97.8 F (36.6 C), height 5\' 7"  (1.702 m), weight 198 lb (89.8 kg).  Physical Exam  General: The patient is alert and cooperative at the time of the examination.  Eyes: Pupils are equal, round, and reactive to light. Discs are flat bilaterally.  Neck: The neck is supple, no carotid bruits are noted.  Respiratory: The respiratory examination is clear.  Cardiovascular: The cardiovascular examination reveals a regular rate and rhythm, no obvious murmurs or rubs are noted.  Neuromuscular: The patient is able to flex the low back to about 80 degrees.  There is fairly good rotational movement of the low back.  Skin: Extremities are without significant edema.  Neurologic Exam  Mental status: The patient is alert and oriented x 3 at the time of the examination. The patient has apparent normal recent and remote memory, with an apparently normal attention span and concentration ability.  Cranial nerves: Facial symmetry is present. There is good sensation of the face to pinprick and soft touch bilaterally. The strength of the facial muscles and the muscles to head turning and shoulder shrug are normal bilaterally. Speech is well enunciated, no aphasia or dysarthria is noted. Extraocular movements are full. Visual fields are full. The tongue is midline, and the patient has symmetric elevation of the soft palate. No obvious hearing deficits are noted.  Motor: The  motor testing reveals 5 over 5 strength of all 4 extremities. Good symmetric motor tone is noted throughout.  Sensory: Sensory testing is intact to pinprick, soft touch, vibration sensation, and position sense on all 4 extremities, with exception of some decreased pinprick sensation on the left foot and lower leg. No evidence of extinction is noted.  Coordination: Cerebellar testing reveals good finger-nose-finger and heel-to-shin bilaterally.  Gait and station: Gait is normal. Tandem gait is normal. Romberg is negative. No drift is seen.  The patient is able to walk on the heels and the toes bilaterally.  Reflexes: Deep tendon reflexes are symmetric and normal bilaterally, with exception of decreased ankle jerk reflexes bilaterally.  Toes are downgoing bilaterally.   Assessment/Plan:  1.  Left leg and foot paresthesias and discomfort  2.  History of  multiple lumbosacral spine surgery procedures  3.  Lumbar and cervical spondylosis  4.  L2-3 spinal stenosis  The patient has relatively unremarkable clinical examination.  She mainly has some numbness and tingling sensations and some discomfort involving the left foot and lower leg.  This likely is a result of prior nerve root injury from her lumbosacral spine surgeries.  She does not have a peripheral neuropathy.  The patient is on a moderate dose of Lyrica, we will go up on this medication taking 100 mg in the morning and 200 mg in the evening, she will call for any dose adjustments.  The patient will follow-up here in 4 months.  If the Lyrica is not effective, we may add another medication such as Cymbalta.  Marlan Palau. Keith  MD 01/23/2019 8:36 AM  [facility] [address] [place] [id number]  Phone [phone] Fax [phone]

## 2019-01-24 DIAGNOSIS — J301 Allergic rhinitis due to pollen: Secondary | ICD-10-CM | POA: Diagnosis not present

## 2019-01-27 DIAGNOSIS — J301 Allergic rhinitis due to pollen: Secondary | ICD-10-CM | POA: Diagnosis not present

## 2019-01-29 DIAGNOSIS — J301 Allergic rhinitis due to pollen: Secondary | ICD-10-CM | POA: Diagnosis not present

## 2019-01-31 DIAGNOSIS — J301 Allergic rhinitis due to pollen: Secondary | ICD-10-CM | POA: Diagnosis not present

## 2019-02-03 DIAGNOSIS — J301 Allergic rhinitis due to pollen: Secondary | ICD-10-CM | POA: Diagnosis not present

## 2019-02-05 DIAGNOSIS — J301 Allergic rhinitis due to pollen: Secondary | ICD-10-CM | POA: Diagnosis not present

## 2019-02-06 DIAGNOSIS — Z6831 Body mass index (BMI) 31.0-31.9, adult: Secondary | ICD-10-CM | POA: Diagnosis not present

## 2019-02-06 DIAGNOSIS — E538 Deficiency of other specified B group vitamins: Secondary | ICD-10-CM | POA: Diagnosis not present

## 2019-02-06 DIAGNOSIS — L309 Dermatitis, unspecified: Secondary | ICD-10-CM | POA: Diagnosis not present

## 2019-02-06 DIAGNOSIS — G629 Polyneuropathy, unspecified: Secondary | ICD-10-CM | POA: Diagnosis not present

## 2019-02-06 DIAGNOSIS — Z299 Encounter for prophylactic measures, unspecified: Secondary | ICD-10-CM | POA: Diagnosis not present

## 2019-02-06 DIAGNOSIS — I471 Supraventricular tachycardia: Secondary | ICD-10-CM | POA: Diagnosis not present

## 2019-02-07 DIAGNOSIS — J301 Allergic rhinitis due to pollen: Secondary | ICD-10-CM | POA: Diagnosis not present

## 2019-02-10 DIAGNOSIS — J301 Allergic rhinitis due to pollen: Secondary | ICD-10-CM | POA: Diagnosis not present

## 2019-02-12 DIAGNOSIS — J301 Allergic rhinitis due to pollen: Secondary | ICD-10-CM | POA: Diagnosis not present

## 2019-02-14 DIAGNOSIS — J301 Allergic rhinitis due to pollen: Secondary | ICD-10-CM | POA: Diagnosis not present

## 2019-02-17 DIAGNOSIS — J301 Allergic rhinitis due to pollen: Secondary | ICD-10-CM | POA: Diagnosis not present

## 2019-02-19 DIAGNOSIS — J301 Allergic rhinitis due to pollen: Secondary | ICD-10-CM | POA: Diagnosis not present

## 2019-02-21 DIAGNOSIS — J301 Allergic rhinitis due to pollen: Secondary | ICD-10-CM | POA: Diagnosis not present

## 2019-02-24 DIAGNOSIS — J301 Allergic rhinitis due to pollen: Secondary | ICD-10-CM | POA: Diagnosis not present

## 2019-02-26 DIAGNOSIS — J301 Allergic rhinitis due to pollen: Secondary | ICD-10-CM | POA: Diagnosis not present

## 2019-02-28 DIAGNOSIS — J301 Allergic rhinitis due to pollen: Secondary | ICD-10-CM | POA: Diagnosis not present

## 2019-02-28 DIAGNOSIS — Z9884 Bariatric surgery status: Secondary | ICD-10-CM | POA: Diagnosis not present

## 2019-03-03 DIAGNOSIS — J301 Allergic rhinitis due to pollen: Secondary | ICD-10-CM | POA: Diagnosis not present

## 2019-03-04 DIAGNOSIS — S93401A Sprain of unspecified ligament of right ankle, initial encounter: Secondary | ICD-10-CM | POA: Diagnosis not present

## 2019-03-05 ENCOUNTER — Other Ambulatory Visit: Payer: Self-pay | Admitting: Surgery

## 2019-03-05 DIAGNOSIS — Z9884 Bariatric surgery status: Secondary | ICD-10-CM

## 2019-03-07 DIAGNOSIS — J301 Allergic rhinitis due to pollen: Secondary | ICD-10-CM | POA: Diagnosis not present

## 2019-03-10 DIAGNOSIS — M8580 Other specified disorders of bone density and structure, unspecified site: Secondary | ICD-10-CM | POA: Diagnosis not present

## 2019-03-10 DIAGNOSIS — S96911A Strain of unspecified muscle and tendon at ankle and foot level, right foot, initial encounter: Secondary | ICD-10-CM | POA: Diagnosis not present

## 2019-03-10 DIAGNOSIS — Z6831 Body mass index (BMI) 31.0-31.9, adult: Secondary | ICD-10-CM | POA: Diagnosis not present

## 2019-03-10 DIAGNOSIS — E538 Deficiency of other specified B group vitamins: Secondary | ICD-10-CM | POA: Diagnosis not present

## 2019-03-10 DIAGNOSIS — I471 Supraventricular tachycardia: Secondary | ICD-10-CM | POA: Diagnosis not present

## 2019-03-10 DIAGNOSIS — Z299 Encounter for prophylactic measures, unspecified: Secondary | ICD-10-CM | POA: Diagnosis not present

## 2019-03-11 ENCOUNTER — Ambulatory Visit: Payer: PRIVATE HEALTH INSURANCE | Admitting: Gastroenterology

## 2019-03-11 DIAGNOSIS — J301 Allergic rhinitis due to pollen: Secondary | ICD-10-CM | POA: Diagnosis not present

## 2019-03-12 ENCOUNTER — Encounter: Payer: Self-pay | Admitting: Orthopedic Surgery

## 2019-03-12 ENCOUNTER — Other Ambulatory Visit: Payer: Self-pay | Admitting: Surgery

## 2019-03-12 ENCOUNTER — Other Ambulatory Visit: Payer: Self-pay

## 2019-03-12 ENCOUNTER — Ambulatory Visit (INDEPENDENT_AMBULATORY_CARE_PROVIDER_SITE_OTHER): Payer: Medicare Other | Admitting: Orthopedic Surgery

## 2019-03-12 ENCOUNTER — Ambulatory Visit (INDEPENDENT_AMBULATORY_CARE_PROVIDER_SITE_OTHER): Payer: Medicare Other

## 2019-03-12 ENCOUNTER — Ambulatory Visit
Admission: RE | Admit: 2019-03-12 | Discharge: 2019-03-12 | Disposition: A | Discharge status: Home / Self Care | Payer: Medicare Other | Source: Ambulatory Visit | Attending: Surgery | Admitting: Surgery

## 2019-03-12 VITALS — Ht 67.0 in | Wt 197.0 lb

## 2019-03-12 DIAGNOSIS — S82831A Other fracture of upper and lower end of right fibula, initial encounter for closed fracture: Secondary | ICD-10-CM

## 2019-03-12 DIAGNOSIS — S93491A Sprain of other ligament of right ankle, initial encounter: Secondary | ICD-10-CM | POA: Diagnosis not present

## 2019-03-12 DIAGNOSIS — M79661 Pain in right lower leg: Secondary | ICD-10-CM

## 2019-03-12 DIAGNOSIS — Z9884 Bariatric surgery status: Secondary | ICD-10-CM

## 2019-03-12 DIAGNOSIS — K222 Esophageal obstruction: Secondary | ICD-10-CM | POA: Diagnosis not present

## 2019-03-12 DIAGNOSIS — M898X6 Other specified disorders of bone, lower leg: Secondary | ICD-10-CM

## 2019-03-14 ENCOUNTER — Telehealth: Payer: Self-pay | Admitting: Orthopedic Surgery

## 2019-03-14 ENCOUNTER — Encounter: Payer: Self-pay | Admitting: Orthopedic Surgery

## 2019-03-14 DIAGNOSIS — J301 Allergic rhinitis due to pollen: Secondary | ICD-10-CM | POA: Diagnosis not present

## 2019-03-14 NOTE — Telephone Encounter (Signed)
I called patient and advised. She has one of these at home and will try it.

## 2019-03-14 NOTE — Progress Notes (Signed)
Office Visit Note   Patient: Patricia Eaton           Date of Birth: [birth year]           MRN: [id number] Visit Date: 03/12/2019 Requested by: Monico Blitz, [address] [place],  [place] [address] PCP: Monico Blitz, MD  Subjective: Chief Complaint  Patient presents with   Right Ankle - Pain    DOI 03/04/2019   Right Knee - Pain    DOI 03/04/2019    HPI: Patricia Eaton is a 68 y.o. female who presents to the office complaining of right leg pain.  Patient notes that she fell 8 days ago with an inversion injury of her right ankle, while falling on her lateral right calf.  [facility]  She notes pain from her right knee down to her right ankle.  Most of her pain is centered around the proximal fibula.  She has been using an ankle brace and Ace wrap with mild relief.  Pain is worse with weightbearing so she has been keeping off the right leg is much as possible.  She has been taking etodolac 400 mg.  She notes swelling but denies any bruising.. She denies any numbness or tingling on the dorsal aspect of the foot.  Denies any medial joint pain.              ROS:  All systems reviewed are negative as they relate to the chief complaint within the history of present illness.  Patient denies fevers or chills.  Assessment & Plan: Visit Diagnoses:  1. Closed fracture of proximal end of right fibula, unspecified fracture morphology, initial encounter   2. Sprain of anterior talofibular ligament of right ankle, initial encounter   3. Pain of right fibula     Plan: Patient is a 68 year old female who presents following ground-level fall with inversion injury of right ankle.  On exam she is tender over the proximal fibula and fibular head as well as over ATFL.  No medial malleolar tenderness i and the ankle joint is stable x-rays were reviewed which revealed no ankle fracture but a possible nondisplaced fibular head fracture was identified.  Her right ankle  syndesmosis is stable on exam and she has no tenderness at all over the medial aspect of the ankle or foot.  Impression is ATFL sprain and nondisplaced fibular head fracture.  No motor deficit or sensory deficit.  Recommended that he continue using etodolac and ankle brace as needed.  This fracture should heal without need for operative management or immobilization.  Patient will follow up with the office in 6 weeks.  X-rays may or may not be required at that time depending on clinical symptoms  Follow-Up Instructions: No follow-ups on file.   Orders:  Orders Placed This Encounter  Procedures   XR Knee 1-2 Views Right   XR Foot Complete Right   XR Ankle Complete Right   XR Tibia/Fibula Right   No orders of the defined types were placed in this encounter.     Procedures: No procedures performed   Clinical Data: No additional findings.  Objective: Vital Signs: Ht 5\' 7"  (1.702 m)    Wt 197 lb (89.4 kg)    BMI 30.85 kg/m   Physical Exam:  Constitutional: Patient appears well-developed HEENT:  Head: Normocephalic Eyes:EOM are normal Neck: Normal range of motion Cardiovascular: Normal rate Pulmonary/chest: Effort normal Neurologic: Patient is alert Skin: Skin is warm Psychiatric: Patient  has normal mood and affect  Ortho Exam:  Right knee Exam No effusion Extensor mechanism intact No TTP over the medial or lateral jointlines, quad tendon, patellar tendon, pes anserinus, patella, tibial tubercle, LCL/MCL insertions Stable to varus/valgus stresses.  Stable to anterior/posterior drawer Extension to 0 degrees Flexion > 90 degrees  TTP over the fibular head No tenderness along the shaft of the tibia No TTP of the lateral malleolus or medial malleolus No laxity of right ankle syndesmosis Mild TTP over the fifth metatarsal tarsal base Moderate TTP over ATFL.  Mild TTP over CFL. Pain elicited at site of ATFL with passive inversion Ankle is symmetrically stable with the  left-hand side to inversion stress and anterior drawer testing.  Specialty Comments:  No specialty comments available.  Imaging: No results found.   PMFS History: Patient Active Problem List   Diagnosis Date Noted   Paresthesia 06/13/2018   Lumbar radiculopathy 06/13/2018   Abdominal pain 03/07/2018   Nausea with vomiting 12/04/2017   Constipation 08/29/2017   History of diverticulitis 08/29/2017   GERD (gastroesophageal reflux disease) 08/29/2017   Past Medical History:  Diagnosis Date   Anxiety    Colonic mass    In [place], found inconclusive   Environmental allergies    trees, grass, oak trees   GERD (gastroesophageal reflux disease)    Neuropathy    Numbness    Pulmonary embolism ([facility])    Seasonal allergies     Family History  Problem Relation Age of Onset   Colon cancer Maternal Aunt    Crohn's disease Grandchild    Crohn's disease Son    Cancer Mother        kidney or liver   Heart failure Father    Heart attack Father    Pancreatic cancer Sister    Stroke Maternal Grandmother     Past Surgical History:  Procedure Laterality Date   APPENDECTOMY     COLONOSCOPY  2018   [place]: diverticulosis   ESOPHAGOGASTRODUODENOSCOPY N/A 01/09/2018   normal esophagus, normal residual stomach status post gastric obesity procedure.   GASTRIC BYPASS  2003   LUMBAR FUSION  2016   PARTIAL HYSTERECTOMY  1980   WRIST FRACTURE SURGERY  2013   Social History   Occupational History   Occupation: Retired from trucking  Tobacco Use   Smoking status: Former Smoker    Types: Cigarettes    Quit date: 05/29/1997    Years since quitting: 21.8   Smokeless tobacco: Never Used  Substance and Sexual Activity   Alcohol use: Yes    Comment: occasional   Drug use: Never   Sexual activity: Not on file

## 2019-03-14 NOTE — Telephone Encounter (Signed)
I called patient back for more information. States she wasn't given a brace or wrap at her appointment yesterday. Wants to know if it is ok to use a ace wrap or is there something else she can do or use? Her knee is still painful with walking. She has increased swelling and pain at night and cannot sleep.  Please advise.  Call back [phone]

## 2019-03-14 NOTE — Telephone Encounter (Signed)
Patient called. Says she would like to know if she can wrap her leg. She is in pain while walking. Her call back number is [phone]

## 2019-03-14 NOTE — Telephone Encounter (Signed)
Okay if she wants to come in and get a knee sleeve open patella.  I think that would be fine.

## 2019-03-15 ENCOUNTER — Encounter: Payer: Self-pay | Admitting: Orthopedic Surgery

## 2019-03-18 DIAGNOSIS — J301 Allergic rhinitis due to pollen: Secondary | ICD-10-CM | POA: Diagnosis not present

## 2019-03-21 DIAGNOSIS — J301 Allergic rhinitis due to pollen: Secondary | ICD-10-CM | POA: Diagnosis not present

## 2019-03-25 DIAGNOSIS — J301 Allergic rhinitis due to pollen: Secondary | ICD-10-CM | POA: Diagnosis not present

## 2019-03-26 DIAGNOSIS — Z9884 Bariatric surgery status: Secondary | ICD-10-CM | POA: Diagnosis not present

## 2019-03-28 DIAGNOSIS — J301 Allergic rhinitis due to pollen: Secondary | ICD-10-CM | POA: Diagnosis not present

## 2019-04-01 DIAGNOSIS — J301 Allergic rhinitis due to pollen: Secondary | ICD-10-CM | POA: Diagnosis not present

## 2019-04-04 DIAGNOSIS — J301 Allergic rhinitis due to pollen: Secondary | ICD-10-CM | POA: Diagnosis not present

## 2019-04-08 DIAGNOSIS — J301 Allergic rhinitis due to pollen: Secondary | ICD-10-CM | POA: Diagnosis not present

## 2019-04-10 DIAGNOSIS — Z6831 Body mass index (BMI) 31.0-31.9, adult: Secondary | ICD-10-CM | POA: Diagnosis not present

## 2019-04-10 DIAGNOSIS — W57XXXA Bitten or stung by nonvenomous insect and other nonvenomous arthropods, initial encounter: Secondary | ICD-10-CM | POA: Diagnosis not present

## 2019-04-10 DIAGNOSIS — G629 Polyneuropathy, unspecified: Secondary | ICD-10-CM | POA: Diagnosis not present

## 2019-04-10 DIAGNOSIS — E538 Deficiency of other specified B group vitamins: Secondary | ICD-10-CM | POA: Diagnosis not present

## 2019-04-10 DIAGNOSIS — K219 Gastro-esophageal reflux disease without esophagitis: Secondary | ICD-10-CM | POA: Diagnosis not present

## 2019-04-10 DIAGNOSIS — Z299 Encounter for prophylactic measures, unspecified: Secondary | ICD-10-CM | POA: Diagnosis not present

## 2019-04-11 DIAGNOSIS — J301 Allergic rhinitis due to pollen: Secondary | ICD-10-CM | POA: Diagnosis not present

## 2019-04-14 DIAGNOSIS — E559 Vitamin D deficiency, unspecified: Secondary | ICD-10-CM | POA: Diagnosis not present

## 2019-04-14 DIAGNOSIS — W57XXXA Bitten or stung by nonvenomous insect and other nonvenomous arthropods, initial encounter: Secondary | ICD-10-CM | POA: Diagnosis not present

## 2019-04-14 DIAGNOSIS — E538 Deficiency of other specified B group vitamins: Secondary | ICD-10-CM | POA: Diagnosis not present

## 2019-04-15 DIAGNOSIS — J301 Allergic rhinitis due to pollen: Secondary | ICD-10-CM | POA: Diagnosis not present

## 2019-04-17 ENCOUNTER — Other Ambulatory Visit: Payer: Self-pay

## 2019-04-17 ENCOUNTER — Ambulatory Visit (INDEPENDENT_AMBULATORY_CARE_PROVIDER_SITE_OTHER): Payer: Medicare Other | Admitting: Orthopedic Surgery

## 2019-04-17 DIAGNOSIS — S82831A Other fracture of upper and lower end of right fibula, initial encounter for closed fracture: Secondary | ICD-10-CM

## 2019-04-18 DIAGNOSIS — J301 Allergic rhinitis due to pollen: Secondary | ICD-10-CM | POA: Diagnosis not present

## 2019-04-19 ENCOUNTER — Encounter: Payer: Self-pay | Admitting: Orthopedic Surgery

## 2019-04-19 NOTE — Progress Notes (Signed)
Office Visit Note   Patient: Patricia Eaton           Date of Birth: [birth year]           MRN: [id number] Visit Date: 04/17/2019 Requested by: Monico Blitz, MD [place],  [place] [address] PCP: Monico Blitz, MD  Subjective: Chief Complaint  Patient presents with  . Follow-up    HPI: Patricia Eaton is a patient with bilateral foot pain.  She had a right foot ankle sprain as well as proximal fibular head fracture.  She not using any braces at this time.  In general she has good and bad days.  She feels like she is compensating with her weightbearing.  Has occasional tenderness on the top of the right foot.  Overall she is managing reasonably well with the injury.              ROS: All systems reviewed are negative as they relate to the chief complaint within the history of present illness.  Patient denies  fevers or chills.   Assessment & Plan: Visit Diagnoses:  1. Closed fracture of proximal end of right fibula, unspecified fracture morphology, initial encounter     Plan: Impression is right foot ankle sprain with proximal fibula fracture and stable syndesmosis.  Swelling is fairly minimal around the right ankle but she still has some tenderness over the ATFL.  I think eventually she will continue to improve and get close to normal with her right foot.  Follow-up with me as needed no intervention required at this time.  Follow-Up Instructions: No follow-ups on file.   Orders:  No orders of the defined types were placed in this encounter.  No orders of the defined types were placed in this encounter.     Procedures: No procedures performed   Clinical Data: No additional findings.  Objective: Vital Signs: There were no vitals taken for this visit.  Physical Exam:   Constitutional: Patient appears well-developed HEENT:  Head: Normocephalic Eyes:EOM are normal Neck: Normal range of motion Cardiovascular: Normal rate Pulmonary/chest: Effort normal  Neurologic: Patient is alert Skin: Skin is warm Psychiatric: Patient has normal mood and affect    Ortho Exam: Ortho exam demonstrates full active and passive range of motion of the ankle with mild tenderness of the ATFL.  Not much tenderness around the fibular head proximally.  Ankle dorsiflexion plantarflexion strength is intact on the right left-hand side.  Syndesmosis is stable to lateral stress and symmetric with the left ankle.  Specialty Comments:  No specialty comments available.  Imaging: No results found.   PMFS History: Patient Active Problem List   Diagnosis Date Noted  . Paresthesia 06/13/2018  . Lumbar radiculopathy 06/13/2018  . Abdominal pain 03/07/2018  . Nausea with vomiting 12/04/2017  . Constipation 08/29/2017  . History of diverticulitis 08/29/2017  . GERD (gastroesophageal reflux disease) 08/29/2017   Past Medical History:  Diagnosis Date  . Anxiety   . Colonic mass    In [place], found inconclusive  . Environmental allergies    trees, grass, oak trees  . GERD (gastroesophageal reflux disease)   . Neuropathy   . Numbness   . Pulmonary embolism ([place])   . Seasonal allergies     Family History  Problem Relation Age of Onset  . Colon cancer Maternal Aunt   . Crohn's disease Grandchild   . Crohn's disease Son   . Cancer Mother        kidney or liver  .  Heart failure Father   . Heart attack Father   . Pancreatic cancer Sister   . Stroke Maternal Grandmother     Past Surgical History:  Procedure Laterality Date  . APPENDECTOMY    . COLONOSCOPY  2018   [place]: diverticulosis  . ESOPHAGOGASTRODUODENOSCOPY N/A 01/09/2018   normal esophagus, normal residual stomach status post gastric obesity procedure.  Marland Kitchen GASTRIC BYPASS  2003  . LUMBAR FUSION  2016  . PARTIAL HYSTERECTOMY  1980  . WRIST FRACTURE SURGERY  2013   Social History   Occupational History  . Occupation: Retired from trucking  Tobacco Use  . Smoking status: Former Smoker     Types: Cigarettes    Quit date: 05/29/1997    Years since quitting: 21.9  . Smokeless tobacco: Never Used  Substance and Sexual Activity  . Alcohol use: Yes    Comment: occasional  . Drug use: Never  . Sexual activity: Not on file

## 2019-04-22 DIAGNOSIS — J301 Allergic rhinitis due to pollen: Secondary | ICD-10-CM | POA: Diagnosis not present

## 2019-04-23 ENCOUNTER — Ambulatory Visit: Payer: Medicare Other | Admitting: Orthopedic Surgery

## 2019-04-25 DIAGNOSIS — J301 Allergic rhinitis due to pollen: Secondary | ICD-10-CM | POA: Diagnosis not present

## 2019-04-28 ENCOUNTER — Telehealth: Payer: Self-pay | Admitting: Orthopedic Surgery

## 2019-04-28 NOTE — Telephone Encounter (Signed)
IC  appt scheduled. It was actually for her husband.

## 2019-04-28 NOTE — Telephone Encounter (Signed)
Please advise. Thanks.  

## 2019-04-28 NOTE — Telephone Encounter (Signed)
Is Dr [name] accepting new PCP patients?

## 2019-04-28 NOTE — Telephone Encounter (Signed)
See below

## 2019-04-28 NOTE — Telephone Encounter (Signed)
Yes. Will need a 40-minute appointment.

## 2019-04-28 NOTE — Telephone Encounter (Signed)
This is one of Dr. Randel Pigg patients so please forward to him. Thanks

## 2019-04-28 NOTE — Telephone Encounter (Signed)
[name] would be good if he is taking patients.

## 2019-04-28 NOTE — Telephone Encounter (Signed)
Patient called wanting to know if you could recommend a General Practitoner. Told of her Dr [name].Wants you to recommend.  Please cal patient [phone]

## 2019-04-28 NOTE — Telephone Encounter (Signed)
FYI--please advise. 

## 2019-04-29 DIAGNOSIS — J301 Allergic rhinitis due to pollen: Secondary | ICD-10-CM | POA: Diagnosis not present

## 2019-05-02 DIAGNOSIS — J301 Allergic rhinitis due to pollen: Secondary | ICD-10-CM | POA: Diagnosis not present

## 2019-05-06 DIAGNOSIS — J301 Allergic rhinitis due to pollen: Secondary | ICD-10-CM | POA: Diagnosis not present

## 2019-05-09 DIAGNOSIS — J301 Allergic rhinitis due to pollen: Secondary | ICD-10-CM | POA: Diagnosis not present

## 2019-05-13 DIAGNOSIS — J301 Allergic rhinitis due to pollen: Secondary | ICD-10-CM | POA: Diagnosis not present

## 2019-05-28 ENCOUNTER — Telehealth: Payer: Self-pay | Admitting: *Deleted

## 2019-05-28 NOTE — Telephone Encounter (Signed)
Called patient and informed her that her FU with Dr Jannifer Franklin needs to be canceled due to MD out of office; she will get a call next week to reschedule. She  verbalized understanding, appreciation.

## 2019-06-02 ENCOUNTER — Ambulatory Visit: Payer: Medicare Other | Admitting: Neurology

## 2019-06-05 DIAGNOSIS — Z9884 Bariatric surgery status: Secondary | ICD-10-CM | POA: Diagnosis not present

## 2019-06-06 DIAGNOSIS — J301 Allergic rhinitis due to pollen: Secondary | ICD-10-CM | POA: Diagnosis not present

## 2019-06-09 ENCOUNTER — Other Ambulatory Visit: Payer: Self-pay

## 2019-06-09 ENCOUNTER — Ambulatory Visit (INDEPENDENT_AMBULATORY_CARE_PROVIDER_SITE_OTHER): Payer: Medicare Other | Admitting: Neurology

## 2019-06-09 ENCOUNTER — Other Ambulatory Visit: Payer: Self-pay | Admitting: Neurology

## 2019-06-09 ENCOUNTER — Encounter: Payer: Self-pay | Admitting: Neurology

## 2019-06-09 VITALS — BP 130/83 | HR 78 | Temp 97.5°F | Ht 67.0 in | Wt 199.5 lb

## 2019-06-09 DIAGNOSIS — M545 Low back pain: Secondary | ICD-10-CM | POA: Diagnosis not present

## 2019-06-09 DIAGNOSIS — G8929 Other chronic pain: Secondary | ICD-10-CM

## 2019-06-09 NOTE — Progress Notes (Signed)
Reason for visit: Back pain, left foot numbness  Patricia Eaton is an 69 y.o. female  History of present illness:  Patricia Eaton is a 69 year old right-handed white female with a history of chronic low back pain.  She claims that she continues to have ongoing back pain that is mainly in the mid back, worse at night.  She has difficulty sleeping because of this.  She remains on Lyrica 100 mg in the morning and 200 mg in the evening, she claims that the Lyrica does help her.  She has had some problems with postural dizziness, she has fallen 2 or 3 months ago and she fractured her right fibular head.  She continues to have some troubles with dizziness only with standing, it goes away when she sits down.  The patient was told that she had Lyme disease but the blood work she brings in was negative, she does not meet the criteria for Lyme disease.  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  She has had MRI of the thoracic spine, and CT of the lumbar spine.  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  The patient reports some discomfort into the hips and down into the thighs bilaterally.  She denies any weakness in the lower extremities, she denies issues controlling the bowels or the bladder.  Past Medical History:  Diagnosis Date  . Anxiety   . Colonic mass    In [place], found inconclusive  . Environmental allergies    trees, grass, oak trees  . GERD (gastroesophageal reflux disease)   . Neuropathy   . Numbness   . Pulmonary embolism ([place])   . Seasonal allergies     Past Surgical History:  Procedure Laterality Date  . APPENDECTOMY    . COLONOSCOPY  2018   [place]: diverticulosis  . ESOPHAGOGASTRODUODENOSCOPY N/A 01/09/2018   normal esophagus, normal residual stomach status post gastric obesity procedure.  Marland Kitchen GASTRIC BYPASS  2003  . LUMBAR FUSION  2016  . PARTIAL HYSTERECTOMY  1980  . WRIST FRACTURE SURGERY  2013     Family History  Problem Relation Age of Onset  . Colon cancer Maternal Aunt   . Crohn's disease Grandchild   . Crohn's disease Son   . Cancer Mother        kidney or liver  . Heart failure Father   . Heart attack Father   . Pancreatic cancer Sister   . Stroke Maternal Grandmother     Social history:  reports that she quit smoking about 22 years ago. Her smoking use included cigarettes. She has never used smokeless tobacco. She reports current alcohol use. She reports that she does not use drugs.    Allergies  Allergen Reactions  . Molds & Smuts     And grass  . Other     Oak causes rash    Medications:  Prior to Admission medications   Medication Sig Start Date End Date Taking? Authorizing Provider  aspirin EC 81 MG tablet Take 81 mg by mouth daily.   Yes [provider]  CALCIUM PO Take 500 mg by mouth daily.    Yes [provider]  cholecalciferol (VITAMIN D) 1000 units tablet Take 5,000 Units by mouth daily.    Yes [provider]  cyanocobalamin (,VITAMIN B-12,) 1000 MCG/ML injection Inject 1,000 mcg into the muscle every 30 (thirty) days. 12/26/17  Yes [provider]  etodolac (LODINE) 400 MG tablet  03/04/19  Yes [provider]  ferrous sulfate 325 (65 FE) MG tablet Take 325 mg by mouth daily with breakfast.   Yes [provider]  fluticasone (FLONASE) 50 MCG/ACT nasal spray Place 2 sprays into both nostrils daily. 06/20/18  Yes [provider]  pregabalin (LYRICA) 100 MG capsule One capsule in the morning and 2 in the evening 01/23/19  Yes York Spaniel, MD  PRESCRIPTION MEDICATION Allergy shot three times weekly   Yes [provider]  tiZANidine (ZANAFLEX) 4 MG tablet Take 1 tablet by mouth daily as needed. 07/11/18  Yes [provider]  trimethoprim (TRIMPEX) 100 MG tablet Take 100 mg by mouth daily. 07/05/18  Yes [provider]    ROS:  Out of a complete 14 system review  of symptoms, the patient complains only of the following symptoms, and all other reviewed systems are negative.  Back pain Foot numbness Dizziness with standing  Blood pressure 130/83, pulse 78, temperature (!) 97.5 F (36.4 C), height 5\' 7"  (1.702 m), weight 199 lb 8 oz (90.5 kg).  Physical Exam  General: The patient is alert and cooperative at the time of the examination.  Neuromuscular: The patient lacks only about 10 degrees of full flexion of the low back bilaterally.  She is able to walk on the heels and the toes bilaterally.  Skin: No significant peripheral edema is noted.   Neurologic Exam  Mental status: The patient is alert and oriented x 3 at the time of the examination. The patient has apparent normal recent and remote memory, with an apparently normal attention span and concentration ability.   Cranial nerves: Facial symmetry is present. Speech is normal, no aphasia or dysarthria is noted. Extraocular movements are full. Visual fields are full.  Motor: The patient has good strength in all 4 extremities.  Sensory examination: Soft touch sensation is symmetric on the face, arms, and legs.  Coordination: The patient has good finger-nose-finger and heel-to-shin bilaterally.  Gait and station: The patient has a normal gait. Tandem gait is normal. Romberg is negative. No drift is seen.  Reflexes: Deep tendon reflexes are symmetric.   Assessment/Plan:  1.  Chronic low back pain  2.  Reports of postural dizziness  The patient will be set up for an epidural steroid injection, if this is not effective, we will consider MRI of the lumbar spine.  The patient indicates that she is quite claustrophobic, but she was able to tolerate a thoracic spine MRI recently.  She will follow-up here in 6 months, she will contact me regarding her pain control following the epidural.  We may consider addition of Cymbalta in the future.  MD 06/09/2019 7:30 AM  [facility] [address] [place], [place]  Phone [phone] Fax [phone]

## 2019-06-10 DIAGNOSIS — J301 Allergic rhinitis due to pollen: Secondary | ICD-10-CM | POA: Diagnosis not present

## 2019-06-12 ENCOUNTER — Other Ambulatory Visit: Payer: Medicare Other

## 2019-06-13 DIAGNOSIS — J301 Allergic rhinitis due to pollen: Secondary | ICD-10-CM | POA: Diagnosis not present

## 2019-06-17 DIAGNOSIS — J301 Allergic rhinitis due to pollen: Secondary | ICD-10-CM | POA: Diagnosis not present

## 2019-06-18 ENCOUNTER — Other Ambulatory Visit: Payer: Self-pay

## 2019-06-18 ENCOUNTER — Ambulatory Visit
Admission: RE | Admit: 2019-06-18 | Discharge: 2019-06-18 | Disposition: A | Discharge status: Home / Self Care | Payer: Medicare Other | Source: Ambulatory Visit | Attending: Neurology | Admitting: Neurology

## 2019-06-18 DIAGNOSIS — M549 Dorsalgia, unspecified: Secondary | ICD-10-CM | POA: Diagnosis not present

## 2019-06-18 DIAGNOSIS — G8929 Other chronic pain: Secondary | ICD-10-CM

## 2019-06-18 MED ORDER — METHYLPREDNISOLONE ACETATE 40 MG/ML INJ SUSP (RADIOLOG
120.0000 mg | Freq: Once | INTRAMUSCULAR | Status: AC
  Administered 2019-06-18: 120 mg via EPIDURAL

## 2019-06-18 MED ORDER — IOPAMIDOL (ISOVUE-M 200) INJECTION 41%
1.0000 mL | Freq: Once | INTRAMUSCULAR | Status: AC
  Administered 2019-06-18: 09:00:00 1 mL via EPIDURAL

## 2019-06-18 NOTE — Discharge Instructions (Signed)

## 2019-06-20 DIAGNOSIS — J301 Allergic rhinitis due to pollen: Secondary | ICD-10-CM | POA: Diagnosis not present

## 2019-06-24 DIAGNOSIS — J301 Allergic rhinitis due to pollen: Secondary | ICD-10-CM | POA: Diagnosis not present

## 2019-06-26 DIAGNOSIS — E1140 Type 2 diabetes mellitus with diabetic neuropathy, unspecified: Secondary | ICD-10-CM | POA: Diagnosis not present

## 2019-06-26 DIAGNOSIS — Z1331 Encounter for screening for depression: Secondary | ICD-10-CM | POA: Diagnosis not present

## 2019-06-26 DIAGNOSIS — Z6830 Body mass index (BMI) 30.0-30.9, adult: Secondary | ICD-10-CM | POA: Diagnosis not present

## 2019-06-26 DIAGNOSIS — E559 Vitamin D deficiency, unspecified: Secondary | ICD-10-CM | POA: Diagnosis not present

## 2019-06-26 DIAGNOSIS — Z1339 Encounter for screening examination for other mental health and behavioral disorders: Secondary | ICD-10-CM | POA: Diagnosis not present

## 2019-06-26 DIAGNOSIS — I471 Supraventricular tachycardia: Secondary | ICD-10-CM | POA: Diagnosis not present

## 2019-06-26 DIAGNOSIS — E538 Deficiency of other specified B group vitamins: Secondary | ICD-10-CM | POA: Diagnosis not present

## 2019-06-26 DIAGNOSIS — Z0000 Encounter for general adult medical examination without abnormal findings: Secondary | ICD-10-CM | POA: Diagnosis not present

## 2019-06-26 DIAGNOSIS — Z299 Encounter for prophylactic measures, unspecified: Secondary | ICD-10-CM | POA: Diagnosis not present

## 2019-06-26 DIAGNOSIS — Z7189 Other specified counseling: Secondary | ICD-10-CM | POA: Diagnosis not present

## 2019-06-27 DIAGNOSIS — J301 Allergic rhinitis due to pollen: Secondary | ICD-10-CM | POA: Diagnosis not present

## 2019-07-01 DIAGNOSIS — J301 Allergic rhinitis due to pollen: Secondary | ICD-10-CM | POA: Diagnosis not present

## 2019-07-04 DIAGNOSIS — J301 Allergic rhinitis due to pollen: Secondary | ICD-10-CM | POA: Diagnosis not present

## 2019-07-08 DIAGNOSIS — J301 Allergic rhinitis due to pollen: Secondary | ICD-10-CM | POA: Diagnosis not present

## 2019-07-11 DIAGNOSIS — J301 Allergic rhinitis due to pollen: Secondary | ICD-10-CM | POA: Diagnosis not present

## 2019-07-15 DIAGNOSIS — J301 Allergic rhinitis due to pollen: Secondary | ICD-10-CM | POA: Diagnosis not present

## 2019-07-18 DIAGNOSIS — J301 Allergic rhinitis due to pollen: Secondary | ICD-10-CM | POA: Diagnosis not present

## 2019-07-21 DIAGNOSIS — Z299 Encounter for prophylactic measures, unspecified: Secondary | ICD-10-CM | POA: Diagnosis not present

## 2019-07-21 DIAGNOSIS — R5383 Other fatigue: Secondary | ICD-10-CM | POA: Diagnosis not present

## 2019-07-21 DIAGNOSIS — E2839 Other primary ovarian failure: Secondary | ICD-10-CM | POA: Diagnosis not present

## 2019-07-21 DIAGNOSIS — E538 Deficiency of other specified B group vitamins: Secondary | ICD-10-CM | POA: Diagnosis not present

## 2019-07-21 DIAGNOSIS — Z6831 Body mass index (BMI) 31.0-31.9, adult: Secondary | ICD-10-CM | POA: Diagnosis not present

## 2019-07-21 DIAGNOSIS — Z87891 Personal history of nicotine dependence: Secondary | ICD-10-CM | POA: Diagnosis not present

## 2019-07-21 DIAGNOSIS — I471 Supraventricular tachycardia: Secondary | ICD-10-CM | POA: Diagnosis not present

## 2019-07-21 DIAGNOSIS — J309 Allergic rhinitis, unspecified: Secondary | ICD-10-CM | POA: Diagnosis not present

## 2019-07-22 DIAGNOSIS — J301 Allergic rhinitis due to pollen: Secondary | ICD-10-CM | POA: Diagnosis not present

## 2019-07-25 DIAGNOSIS — J301 Allergic rhinitis due to pollen: Secondary | ICD-10-CM | POA: Diagnosis not present

## 2019-07-29 DIAGNOSIS — J301 Allergic rhinitis due to pollen: Secondary | ICD-10-CM | POA: Diagnosis not present

## 2019-08-01 DIAGNOSIS — J301 Allergic rhinitis due to pollen: Secondary | ICD-10-CM | POA: Diagnosis not present

## 2019-08-05 DIAGNOSIS — N3020 Other chronic cystitis without hematuria: Secondary | ICD-10-CM | POA: Diagnosis not present

## 2019-08-05 DIAGNOSIS — J301 Allergic rhinitis due to pollen: Secondary | ICD-10-CM | POA: Diagnosis not present

## 2019-08-08 DIAGNOSIS — J301 Allergic rhinitis due to pollen: Secondary | ICD-10-CM | POA: Diagnosis not present

## 2019-08-12 DIAGNOSIS — J301 Allergic rhinitis due to pollen: Secondary | ICD-10-CM | POA: Diagnosis not present

## 2019-08-15 DIAGNOSIS — J301 Allergic rhinitis due to pollen: Secondary | ICD-10-CM | POA: Diagnosis not present

## 2019-08-18 DIAGNOSIS — Z6831 Body mass index (BMI) 31.0-31.9, adult: Secondary | ICD-10-CM | POA: Diagnosis not present

## 2019-08-18 DIAGNOSIS — Z87891 Personal history of nicotine dependence: Secondary | ICD-10-CM | POA: Diagnosis not present

## 2019-08-18 DIAGNOSIS — R5383 Other fatigue: Secondary | ICD-10-CM | POA: Diagnosis not present

## 2019-08-18 DIAGNOSIS — M8580 Other specified disorders of bone density and structure, unspecified site: Secondary | ICD-10-CM | POA: Diagnosis not present

## 2019-08-18 DIAGNOSIS — R350 Frequency of micturition: Secondary | ICD-10-CM | POA: Diagnosis not present

## 2019-08-18 DIAGNOSIS — W57XXXA Bitten or stung by nonvenomous insect and other nonvenomous arthropods, initial encounter: Secondary | ICD-10-CM | POA: Diagnosis not present

## 2019-08-18 DIAGNOSIS — Z299 Encounter for prophylactic measures, unspecified: Secondary | ICD-10-CM | POA: Diagnosis not present

## 2019-08-18 DIAGNOSIS — E538 Deficiency of other specified B group vitamins: Secondary | ICD-10-CM | POA: Diagnosis not present

## 2019-08-19 DIAGNOSIS — J301 Allergic rhinitis due to pollen: Secondary | ICD-10-CM | POA: Diagnosis not present

## 2019-08-22 DIAGNOSIS — J301 Allergic rhinitis due to pollen: Secondary | ICD-10-CM | POA: Diagnosis not present

## 2019-08-26 DIAGNOSIS — J301 Allergic rhinitis due to pollen: Secondary | ICD-10-CM | POA: Diagnosis not present

## 2019-08-28 ENCOUNTER — Other Ambulatory Visit: Payer: Self-pay | Admitting: Neurology

## 2019-08-28 ENCOUNTER — Other Ambulatory Visit: Payer: Self-pay | Admitting: Nurse Practitioner

## 2019-08-28 DIAGNOSIS — K2100 Gastro-esophageal reflux disease with esophagitis, without bleeding: Secondary | ICD-10-CM

## 2019-08-28 NOTE — Telephone Encounter (Signed)
Please verify if patient is still taking this medication. States it was discontinued on 06/09/19 but this may have been an error.

## 2019-08-28 NOTE — Telephone Encounter (Signed)
Spoke with pt. Pt is taking Omperazole 40 mg once daily.

## 2019-09-01 DIAGNOSIS — E559 Vitamin D deficiency, unspecified: Secondary | ICD-10-CM | POA: Diagnosis not present

## 2019-09-01 DIAGNOSIS — J301 Allergic rhinitis due to pollen: Secondary | ICD-10-CM | POA: Diagnosis not present

## 2019-09-01 DIAGNOSIS — Z79899 Other long term (current) drug therapy: Secondary | ICD-10-CM | POA: Diagnosis not present

## 2019-09-05 DIAGNOSIS — J301 Allergic rhinitis due to pollen: Secondary | ICD-10-CM | POA: Diagnosis not present

## 2019-09-09 DIAGNOSIS — J301 Allergic rhinitis due to pollen: Secondary | ICD-10-CM | POA: Diagnosis not present

## 2019-09-12 DIAGNOSIS — J301 Allergic rhinitis due to pollen: Secondary | ICD-10-CM | POA: Diagnosis not present

## 2019-09-16 DIAGNOSIS — J301 Allergic rhinitis due to pollen: Secondary | ICD-10-CM | POA: Diagnosis not present

## 2019-09-17 DIAGNOSIS — E538 Deficiency of other specified B group vitamins: Secondary | ICD-10-CM | POA: Diagnosis not present

## 2019-09-17 DIAGNOSIS — I471 Supraventricular tachycardia: Secondary | ICD-10-CM | POA: Diagnosis not present

## 2019-09-17 DIAGNOSIS — Z299 Encounter for prophylactic measures, unspecified: Secondary | ICD-10-CM | POA: Diagnosis not present

## 2019-09-19 DIAGNOSIS — J301 Allergic rhinitis due to pollen: Secondary | ICD-10-CM | POA: Diagnosis not present

## 2019-09-23 DIAGNOSIS — J301 Allergic rhinitis due to pollen: Secondary | ICD-10-CM | POA: Diagnosis not present

## 2019-09-26 DIAGNOSIS — J301 Allergic rhinitis due to pollen: Secondary | ICD-10-CM | POA: Diagnosis not present

## 2019-09-30 DIAGNOSIS — J301 Allergic rhinitis due to pollen: Secondary | ICD-10-CM | POA: Diagnosis not present

## 2019-10-03 DIAGNOSIS — J301 Allergic rhinitis due to pollen: Secondary | ICD-10-CM | POA: Diagnosis not present

## 2019-10-06 DIAGNOSIS — J301 Allergic rhinitis due to pollen: Secondary | ICD-10-CM | POA: Diagnosis not present

## 2019-10-10 DIAGNOSIS — J301 Allergic rhinitis due to pollen: Secondary | ICD-10-CM | POA: Diagnosis not present

## 2019-10-14 DIAGNOSIS — J301 Allergic rhinitis due to pollen: Secondary | ICD-10-CM | POA: Diagnosis not present

## 2019-10-15 ENCOUNTER — Other Ambulatory Visit: Payer: Self-pay

## 2019-10-15 ENCOUNTER — Encounter: Payer: Self-pay | Admitting: Allergy & Immunology

## 2019-10-15 ENCOUNTER — Ambulatory Visit (INDEPENDENT_AMBULATORY_CARE_PROVIDER_SITE_OTHER): Payer: Medicare Other | Admitting: Allergy & Immunology

## 2019-10-15 VITALS — BP 112/68 | HR 77 | Temp 98.2°F | Resp 18 | Ht 68.0 in | Wt 200.0 lb

## 2019-10-15 DIAGNOSIS — T7800XD Anaphylactic reaction due to unspecified food, subsequent encounter: Secondary | ICD-10-CM | POA: Diagnosis not present

## 2019-10-15 DIAGNOSIS — J310 Chronic rhinitis: Secondary | ICD-10-CM | POA: Diagnosis not present

## 2019-10-15 NOTE — Patient Instructions (Addendum)
1. Chronic rhinitis - I am going to get environmental allergy testing via to blood to check on your allergy levels.  - Continue with fluticasone one spray per nostril daily.  - Add on Xyzal (levocetirizine) 5mg  at night to see if that helps with your morning sneezing. - Addressing your allergies appropriately might help with your sleep quality.   2. Anaphylactic shock due to food - Continue to avoid red meat.  - The alpha gal IgE level was fairly low, but since you are reacting to red meat I would avoid this anyway.  - We can re-test annually to see where your levels are hanging out.  - We are going to repeat the alpha gal panel to see if maybe you had just not fully seroconverted from the reaction. - Watch for cross contamination especially when you go out to eat. - Anaphylaxis management plan provided. - EpiPen is up to date.   3. Return in about 3 months (around 01/15/2020). This can be an in-person, a virtual Webex or a telephone follow up visit.   Please inform us of any Emergency Department visits, hospitalizations, or changes in symptoms. Call us before going to the ED for breathing or allergy symptoms since we might be able to fit you in for a sick visit. Feel free to contact us anytime with any questions, problems, or concerns.  It was a pleasure to meet you today!  Websites that have reliable patient information: 1. American Academy of Asthma, Allergy, and Immunology: www.aaaai.org 2. Food Allergy Research and Education (FARE): foodallergy.org 3. Mothers of Asthmatics: http://www.asthmacommunitynetwork.org 4. American College of Allergy, Asthma, and Immunology: www.acaai.org   COVID-19 Vaccine Information can be found at: ShippingScam.co.uk For questions related to vaccine distribution or appointments, please email [email or web address] or call [phone].     "Like" [place] on Facebook and Instagram for our latest  updates!       HAPPY SPRING!  Make sure you are registered to vote! If you have moved or changed any of your contact information, you will need to get this updated before voting!  In some cases, you MAY be able to register to vote online: [email or web address]    Alpha-gal and Red Meat Allergy   Overview An allergy to "alpha-gal" refers to having a severe and potentially life-threatening allergy to a carbohydrate molecule called galactose-alpha-1,3-galactose that is found in most mammalian or "red meat". Unlike other food allergies which typically occur within minutes of ingestion, symptoms from eating red meat such as pork, lamb or beef may be delayed, occurring 3-8 hours after eating. Most food allergies are directed against a protein molecule, but alpha-gal is unusual because it is a carbohydrate, and a delay in its absorption may explain the delay in symptoms.  What are the symptoms of an alpha-gal allergy? As with other food allergies, signs or symptoms of an allergy to alpha-gal may include: . Hives and itching  . Swelling of your lips, face or eyelids  . Shortness of breath, cough or wheezing  . Abdominal pain, nausea, diarrhea or vomiting The most severe reaction, anaphylaxis, can present as a combination of several of these symptoms, may include low blood pressure, and is potentially fatal.  Because these symptoms are delayed, you may only wake up with them in the middle of the night after an evening meal.  How is an alpha-gal allergy diagnosed? Diagnosis of this allergy starts with your allergist taking an appropriate history and physical examination. Because the onset is usually quite delayed, it can  be hard to associate the symptoms with eating red meat many hours previously. Triggers include any red meat - including beef, pork, lamb or even horse products. It may occur after eating hotdogs and hamburgers. In very rare cases the reaction may extend  to milk or dairy proteins and gelatin.  Your allergist may recommend testing that includes skin tests to the relevant animal proteins and blood tests which measure the levels of a specific immunoglobulin E (IgE) antibody, to mammalian meats. An investigational blood test, IgE against alpha-gal itself, may also aid in the diagnosis.  How is an alpha-gal allergy treated? Immediate symptoms such as hives or shortness of breath are treated the same as any other food allergy - in an urgent care setting with anti-histamines, epinephrine and other medications. Prevention long-term involves avoidance of all red meat in sensitized individuals. You may be advised to carry an epinephrine auto-injector, to be used in case of subsequent accidental exposures and reaction. These measures do not necessarily mean switching to a full vegetarian diet, since poultry and fish can be consumed and do not cause similar reactions. As with other food allergies, there is the possibility that over time the sensitivity diminishes - although these changes may take many years to become apparent.  How do you become allergic to alpha-gal? Alpha-gal is a molecule carried in the saliva of the [place] tick and other potential arthropods typically after feeding on mammalian blood. People that are bitten by the tick, especially those that are bitten repeatedly, are at risk of becoming sensitized and producing the IgE necessary to then cause allergic reactions. Interestingly, allergic reactions may occur to red meat, to subsequent tick bites, and even to medications that contain alpha-gal. Cetuximab is a cancer medication that contains alpha-gal, and people who have had allergic reactions to this medication (these are typically immediate reactions, because it is infused intravenously) have a higher risk for red meat allergy and are likely to have been bitten by ticks in the past. As might be expected, the incidence of tick bites is much higher  in the [place] the traditional habitat for the tick. However, cases are now increasingly reported in the [place] and [place] states. And it is a phenomenon that has been observed worldwide, with different ticks responsible for similar cases of red meat allergy in many other countries such as [place], [place] and [place].  The discovery of this peculiar allergy has allowed researchers to correlate tick bites with many cases of anaphylaxis that would previously have been classified as 'idiopathic', or of unknown cause. Also, while it was originally thought that the [employer] tick had to feast on mammalian blood in order to carry the alpha-gal molecule, more recent research has shown that it may carry this molecule and be capable of sensitizing humans independently.  How do you prevent an alpha-gal allergy? Because this allergy is predominantly tick born, you are more likely at risk if you often go outdoors in wooded areas for activities such as hiking, fishing or hunting. The key strategy is to prevent tick bites. This may include wearing long sleeved shirts or pants, using appropriate insect repellants, and surveying for ticks after spending time outdoors. Any observed ticks should be removed carefully by cleaning the site with rubbing alcohol, then using tweezers to pull the tick's head up carefully from the skin using steady pressure. Clean your hands and the site one more time and make sure not to crush the tick between your  fingers.

## 2019-10-15 NOTE — Progress Notes (Signed)
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 Chronic rhinitis - I am going to get environmental allergy testing via to blood to check on your allergy levels.  - Continue with fluticasone one spray per nostril daily.  - Add on Xyzal (levocetirizine) 5mg  at night to see if that helps with your morning sneezing. - Addressing your allergies appropriately might help with your sleep quality.   2. Anaphylactic shock due to food - Continue to avoid red meat.  - The alpha gal IgE level was fairly low, but since you are reacting to red meat I would avoid this anyway.  - We can re-test annually to see where your levels are hanging out.  - We are going to repeat the alpha gal panel to see if maybe you had just not fully seroconverted from the reaction. - Watch for cross contamination especially when you go out to eat. - Anaphylaxis management plan provided. - EpiPen is up to date.   3. Return in about 3 months (around 01/15/2020). This can be an in-person, a virtual Webex or a telephone follow up visit.   Subjective:   Patricia Eaton is a 69 y.o. female presenting today for evaluation of  Chief Complaint  Patient presents with  . Allergic Rhinitis     Tick bite twice     Patricia Eaton has a history of the following: Patient Active Problem List   Diagnosis Date Noted  . Paresthesia 06/13/2018  . Lumbar radiculopathy 06/13/2018  . Abdominal pain 03/07/2018  . Nausea with vomiting 12/04/2017  . Constipation 08/29/2017  . History of diverticulitis 08/29/2017  . GERD (gastroesophageal reflux disease) 08/29/2017    History obtained from: chart review and patient.  Patricia Eaton was referred by 10/29/2017, MD.     Patricia Eaton is a 69 y.o. female presenting for an evaluation of possible alpha gal  syndrome.  She has been bitten twice by ticks and had her first reaction last year. She tells me that she was bit on her leg and then started having vomiting with exposure to red meat. She went to see Dr. 73 and there was concern for Lyme syndrome. She had testing that was positive to Lyme disease. She was on doxycycline for this.   She moved here recently from [place]. She moved here in December 2017 and moved down here full time in 2018. She lives in [place].  She is currently with her fifth or sixth husband, whom she seems to like a lot.   Allergic Rhinitis Symptom History: She dopes have a history of environmental allergies. She reports that she had testing done after she broke out on her legs. She is on shots twice weekly. She has been doing this for one year. She is unsure whether they are working. She is currently on Flonase. She is not on an antihistamine. She has not tried taking an antihistamine to see if this helps witth her symptoms.  She was told that she can stop her antihistamines right when she started shots.  She is unsure if the shots have helped at all.  According to her testing from an unknown date, she had testing that was positive to [place], [place], grasses, horse, mouse, pigweed, plantain, ragweed, feather, and cockroach.  However, none of these were larger than the histamine.  Food Allergy  Symptom History: She is avoiding all red meat.  She tends to get nearly immediate vomiting.  She did have ribs over the weekend and her husband made and she had a similar reaction.  She does have an EpiPen.  Otherwise, there is no history of other atopic diseases, including asthma, drug allergies, stinging insect allergies, eczema, urticaria or contact dermatitis. There is no significant infectious history. Vaccinations are up to date.    Past Medical History: Patient Active Problem List   Diagnosis Date Noted  . Paresthesia 06/13/2018  . Lumbar radiculopathy 06/13/2018  . Abdominal  pain 03/07/2018  . Nausea with vomiting 12/04/2017  . Constipation 08/29/2017  . History of diverticulitis 08/29/2017  . GERD (gastroesophageal reflux disease) 08/29/2017    Medication List:  Allergies as of 10/15/2019      Reactions   Molds & Smuts Cough   And grass; cause sneezing and watery eyes, too   Other Rash   Oak causes rash      Medication List       Accurate as of Oct 15, 2019 12:24 PM. If you have any questions, ask your nurse or doctor.        STOP taking these medications   etodolac 400 MG tablet Commonly known as: LODINE Stopped by: Alfonse Spruce, MD     TAKE these medications   aspirin EC 81 MG tablet Take 81 mg by mouth daily.   CALCIUM PO Take 500 mg by mouth daily.   cholecalciferol 1000 units tablet Commonly known as: VITAMIN D Take 5,000 Units by mouth daily.   cyanocobalamin 1000 MCG/ML injection Commonly known as: (VITAMIN B-12) Inject 1,000 mcg into the muscle every 30 (thirty) days.   ferrous sulfate 325 (65 FE) MG tablet Take 325 mg by mouth daily with breakfast.   fluticasone 50 MCG/ACT nasal spray Commonly known as: FLONASE Place 2 sprays into both nostrils daily.   omeprazole 40 MG capsule Commonly known as: PRILOSEC Take 1 capsule (40 mg total) by mouth daily.   pregabalin 100 MG capsule Commonly known as: LYRICA TAKE 1 CAPSULE EVERY MORNING AND 2 CAPSULES IN THE EVENING   PRESCRIPTION MEDICATION Allergy shot three times weekly   tiZANidine 4 MG tablet Commonly known as: ZANAFLEX Take 1 tablet by mouth daily as needed.   trimethoprim 100 MG tablet Commonly known as: TRIMPEX Take 100 mg by mouth daily.       Birth History: non-contributory  Developmental History: non-contributory  Past Surgical History: Past Surgical History:  Procedure Laterality Date  . APPENDECTOMY    . COLONOSCOPY  2018   [place]: diverticulosis  . ESOPHAGOGASTRODUODENOSCOPY N/A 01/09/2018   normal esophagus, normal residual  stomach status post gastric obesity procedure.  Marland Kitchen GASTRIC BYPASS  2003  . LUMBAR FUSION  2016  . PARTIAL HYSTERECTOMY  1980  . TONSILLECTOMY    . WRIST FRACTURE SURGERY  2013     Family History: Family History  Problem Relation Age of Onset  . Colon cancer Maternal Aunt   . Crohn's disease Grandchild   . Crohn's disease Son   . Cancer Mother        kidney or liver  . Heart failure Father   . Heart attack Father   . Pancreatic cancer Sister   . Stroke Maternal Grandmother   . Allergic rhinitis Neg Hx   . Asthma Neg Hx   . Eczema Neg Hx   . Urticaria Neg Hx      Social History:  Patricia Eaton lives at home with her husband.  They live in a house that is 75 years old.  There is laminate in the main living areas and carpeting in the bedrooms.  They have gas heating and central cooling.  There are 2 dogs inside of the home.  They do have dust mite covers on the bedding.  There is no tobacco exposure.  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   Review of Systems  Constitutional: Negative.  Negative for fever, malaise/fatigue and weight loss.  HENT: Negative.  Negative for congestion, ear discharge and ear pain.   Eyes: Negative for pain, discharge and redness.  Respiratory: Negative for cough, sputum production, shortness of breath and wheezing.   Cardiovascular: Negative.  Negative for chest pain and palpitations.  Gastrointestinal: Negative for abdominal pain, diarrhea, heartburn, nausea and vomiting.  Skin: Negative.  Negative for itching and rash.  Neurological: Negative for dizziness and headaches.  Endo/Heme/Allergies: Positive for environmental allergies. Does not bruise/bleed easily.       Positive for food allergies.       Objective:   Blood pressure 112/68, pulse 77, temperature 98.2 F (36.8 C), temperature source Temporal, resp. rate 18, height 5\' 8"  (1.727 m), weight 200 lb (90.7 kg), SpO2 96 %. Body mass index is  30.41 kg/m.   Physical Exam:   Physical Exam  Constitutional: She appears well-developed and well-nourished.  Very talkative female.  HENT:  Head: Normocephalic and atraumatic.  Right Ear: Tympanic membrane, external ear and ear canal normal. No drainage, swelling or tenderness. Tympanic membrane is not injected, not scarred, not erythematous, not retracted and not bulging.  Left Ear: Tympanic membrane, external ear and ear canal normal. No drainage, swelling or tenderness. Tympanic membrane is not injected, not scarred, not erythematous, not retracted and not bulging.  Nose: Mucosal edema and rhinorrhea present. No nasal deformity or septal deviation. No epistaxis. Right sinus exhibits no maxillary sinus tenderness and no frontal sinus tenderness. Left sinus exhibits no maxillary sinus tenderness and no frontal sinus tenderness.  Mouth/Throat: Uvula is midline and oropharynx is clear and moist. Mucous membranes are not pale and not dry.  Turbinates enlarged bilaterally.  Eyes: Pupils are equal, round, and reactive to light. Conjunctivae and EOM are normal. Right eye exhibits no chemosis and no discharge. Left eye exhibits no chemosis and no discharge. Right conjunctiva is not injected. Left conjunctiva is not injected.  Cardiovascular: Normal rate, regular rhythm and normal heart sounds.  Respiratory: Effort normal and breath sounds normal. No accessory muscle usage. No tachypnea. No respiratory distress. She has no wheezes. She has no rhonchi. She has no rales. She exhibits no tenderness.  Moving air well in all lung fields.  No increased work of breathing.  GI: There is no abdominal tenderness. There is no rebound and no guarding.  Lymphadenopathy:       Head (right side): No submandibular, no tonsillar and no occipital adenopathy present.       Head (left side): No submandibular, no tonsillar and no occipital adenopathy present.    She has no cervical adenopathy.  Neurological: She is  alert.  Skin: No abrasion, no petechiae and no rash noted. Rash is not papular, not vesicular and not urticarial. No erythema. No pallor.  No eczematous or urticarial lesions noted.  Psychiatric: She has a normal mood and affect.     Diagnostic studies: labs sent instead     Salvatore Marvel,  MD [facility]

## 2019-10-17 ENCOUNTER — Telehealth: Payer: Self-pay | Admitting: Allergy & Immunology

## 2019-10-17 DIAGNOSIS — J301 Allergic rhinitis due to pollen: Secondary | ICD-10-CM | POA: Diagnosis not present

## 2019-10-17 NOTE — Telephone Encounter (Signed)
Patient made aware that she will received a call to discuss her lab results once they have been reviewed by [name]. She verbalized understanding.

## 2019-10-17 NOTE — Telephone Encounter (Signed)
Patient would like someone to explain her test results. She see them on MyChart.

## 2019-10-20 DIAGNOSIS — E1165 Type 2 diabetes mellitus with hyperglycemia: Secondary | ICD-10-CM | POA: Diagnosis not present

## 2019-10-20 DIAGNOSIS — E538 Deficiency of other specified B group vitamins: Secondary | ICD-10-CM | POA: Diagnosis not present

## 2019-10-20 DIAGNOSIS — Z299 Encounter for prophylactic measures, unspecified: Secondary | ICD-10-CM | POA: Diagnosis not present

## 2019-10-20 DIAGNOSIS — I471 Supraventricular tachycardia: Secondary | ICD-10-CM | POA: Diagnosis not present

## 2019-10-20 DIAGNOSIS — E1142 Type 2 diabetes mellitus with diabetic polyneuropathy: Secondary | ICD-10-CM | POA: Diagnosis not present

## 2019-10-21 DIAGNOSIS — J301 Allergic rhinitis due to pollen: Secondary | ICD-10-CM | POA: Diagnosis not present

## 2019-10-21 LAB — TRYPTASE: Tryptase: 8 ug/L (ref 2.2–13.2)

## 2019-10-21 LAB — IGE+ALLERGENS ZONE 2(30)
Alternaria Alternata IgE: <0.1 kU/L
Amer Sycamore IgE Qn: <0.1 kU/L
Aspergillus Fumigatus IgE: <0.1 kU/L
Bahia Grass IgE: <0.1 kU/L
Bermuda Grass IgE: <0.1 kU/L
Cat Dander IgE: <0.1 kU/L
Cedar, Mountain IgE: <0.1 kU/L
Cladosporium Herbarum IgE: <0.1 kU/L
Cockroach, American IgE: <0.1 kU/L
Common Silver Birch IgE: <0.1 kU/L
D Farinae IgE: <0.1 kU/L
D Pteronyssinus IgE: <0.1 kU/L
Dog Dander IgE: <0.1 kU/L
Elm, American IgE: <0.1 kU/L
Hickory, White IgE: <0.1 kU/L
IgE (Immunoglobulin E), Serum: 421 IU/mL (ref 6–495)
Johnson Grass IgE: <0.1 kU/L
Maple/Box Elder IgE: <0.1 kU/L
Mucor Racemosus IgE: <0.1 kU/L
Mugwort IgE Qn: <0.1 kU/L
Nettle IgE: <0.1 kU/L
Oak, White IgE: <0.1 kU/L
Penicillium Chrysogen IgE: <0.1 kU/L
Pigweed, Rough IgE: <0.1 kU/L
Plantain, English IgE: <0.1 kU/L
Ragweed, Short IgE: 0.6 kU/L — AB
Sheep Sorrel IgE Qn: <0.1 kU/L
Stemphylium Herbarum IgE: <0.1 kU/L
Sweet gum IgE RAST Ql: <0.1 kU/L
Timothy Grass IgE: <0.1 kU/L
White Mulberry IgE: <0.1 kU/L

## 2019-10-21 LAB — ALPHA-GAL PANEL
Alpha Gal IgE*: 2.87 kU/L — ABNORMAL HIGH (ref <0.10)
Beef (Bos spp) IgE: 0.21 kU/L (ref <0.35)
Class Interpretation: 0
Class Interpretation: 0
Lamb/Mutton (Ovis spp) IgE: <0.1 kU/L (ref <0.35)
Pork (Sus spp) IgE: <0.1 kU/L (ref <0.35)

## 2019-10-24 DIAGNOSIS — J301 Allergic rhinitis due to pollen: Secondary | ICD-10-CM | POA: Diagnosis not present

## 2019-10-24 NOTE — Telephone Encounter (Signed)
Called and spoke with patient and advised of lab results and intradermal. Patient verbalized understanding and has been scheduled to come in and do intradermal testing.

## 2019-10-24 NOTE — Telephone Encounter (Signed)
Patient called because she is very concerned about her Alpha Gal Results. Will you please interpret them for me and I will call her back to go over them? They didn't really say much in your first results review. She is also due to do allergy testing with her PCP but she is wanting your recommendation should she just to the ID testing with you? Please advise.

## 2019-10-24 NOTE — Telephone Encounter (Signed)
There is not really much else to say.  We tend to redraw the levels every year to see whether trending.  Continues to avoid all mammalian meat to decrease the risk of a reaction.   Some patients react to cows milk and gelatin as well, but those tend to be of patients whose IgE is greater than 100.  If she is drinking cows milk without any issues, I would not worry about avoiding this.  I would probably just recommend coming to our office to do the intradermal testing.  I think she is already receiving allergy shots from her PCP, but honestly with the testing that we did, I imagine that she might not necessarily need these shots.   Malachi Bonds, MD [facility]

## 2019-10-28 DIAGNOSIS — J301 Allergic rhinitis due to pollen: Secondary | ICD-10-CM | POA: Diagnosis not present

## 2019-10-29 ENCOUNTER — Other Ambulatory Visit: Payer: Self-pay

## 2019-10-29 ENCOUNTER — Ambulatory Visit (INDEPENDENT_AMBULATORY_CARE_PROVIDER_SITE_OTHER): Payer: Medicare Other | Admitting: Allergy & Immunology

## 2019-10-29 ENCOUNTER — Encounter: Payer: Self-pay | Admitting: Allergy & Immunology

## 2019-10-29 DIAGNOSIS — R5382 Chronic fatigue, unspecified: Secondary | ICD-10-CM | POA: Diagnosis not present

## 2019-10-29 DIAGNOSIS — J3089 Other allergic rhinitis: Secondary | ICD-10-CM

## 2019-10-29 DIAGNOSIS — J302 Other seasonal allergic rhinitis: Secondary | ICD-10-CM

## 2019-10-29 NOTE — Patient Instructions (Addendum)
1. Chronic rhinitis - Testing today via intradermal was positive to ragweed, dog, molds, and dust mites - Avoidance measures provided. - All of the grasses, weeds, and trees were negative (aside from ragweed) and I doubt that one year of allergy shots would have made these negative. - You can stop allergy shots if you would like with Dr. Sherryll Burger, but this is totally up to you. - Repeat testing on their end is not indicated (we rarely do routine testing). - Continue with fluticasone one spray per nostril daily EVERY DAY.   - Continue with Xyzal (levocetirizine) 5mg  at night. - Addressing your allergies appropriately might help with your sleep quality.   2. Anaphylactic shock due to food - Continue to avoid red meat.  - I do not know how to explain the fevers, but I will do some research into it. - Avoidance of red meats seems to avoid the episodes, so I would just continue to do that.  3. Fatigue  - We are going to get some thyroid testing. - We are also doing an immune screen as well.  - We will call you in 1-2 weeks with the results of the testing. - Strongly consider a sleep study to make sure that you are getting an adequate QUALITY of sleep. - We can put that referral in if you are interested.   4. Return in about 4 weeks (around 11/26/2019). This can be an in-person, a virtual Webex or a telephone follow up visit.   Please inform 11/28/2019 of any Emergency Department visits, hospitalizations, or changes in symptoms. Call us before going to the ED for breathing or allergy symptoms since we might be able to fit you in for a sick visit. Feel free to contact us anytime with any questions, problems, or concerns.  It was a pleasure to see you again today!  Websites that have reliable patient information: 1. American Academy of Asthma, Allergy, and Immunology: www.aaaai.org 2. Food Allergy Research and Education (FARE): foodallergy.org 3. Mothers of Asthmatics:  http://www.asthmacommunitynetwork.org 4. American College of Allergy, Asthma, and Immunology: www.acaai.org   COVID-19 Vaccine Information can be found at: [place] For questions related to vaccine distribution or appointments, please email [email or web address] or call [phone].     "Like" [phone] on Facebook and Instagram for our latest updates!       HAPPY SPRING!  Make sure you are registered to vote! If you have moved or changed any of your contact information, you will need to get this updated before voting!  In some cases, you MAY be able to register to vote online: [place]

## 2019-10-29 NOTE — Progress Notes (Signed)
FOLLOW UP  Date of Service/Encounter:  10/29/19   Assessment:   Chronic fatigue  Seasonal and perennial allergic rhinitis (ragweed, cat, dog, molds, and dust mites)  Plan/Recommendations:   1. Chronic rhinitis - Testing today via intradermal was positive to ragweed, cat, dog, molds, and dust mites - Avoidance measures provided. - All of the grasses, weeds, and trees were negative (aside from ragweed) and I doubt that one year of allergy shots would have made these negative. - You can stop allergy shots if you would like with Dr. Sherryll Burger, but this is totally up to you. - Repeat testing on their end is not indicated (we rarely do routine testing). - Continue with fluticasone one spray per nostril daily EVERY DAY.   - Continue with Xyzal (levocetirizine) 5mg  at night. - Addressing your allergies appropriately might help with your sleep quality.   2. Anaphylactic shock due to food - Continue to avoid red meat.  - I do not know how to explain the fevers, but I will do some research into it. - Avoidance of red meats seems to avoid the episodes, so I would just continue to do that.  3. Fatigue  - We are going to get some thyroid testing. - We are also doing an immune screen as well.  - We will call you in 1-2 weeks with the results of the testing. - Strongly consider a sleep study to make sure that you are getting an adequate QUALITY of sleep. - We can put that referral in if you are interested.   4. Follow up in 4 weeks or earlier if needed.   Subjective:   Patricia Eaton is a 69 y.o. female presenting today for follow up of No chief complaint on file.   Patricia Eaton has a history of the following: Patient Active Problem List   Diagnosis Date Noted   Paresthesia 06/13/2018   Lumbar radiculopathy 06/13/2018   Abdominal pain 03/07/2018   Nausea with vomiting 12/04/2017   Constipation 08/29/2017   History of diverticulitis 08/29/2017   GERD  (gastroesophageal reflux disease) 08/29/2017    History obtained from: chart review and patient.  Patricia Eaton is a 69 y.o. female presenting for skin testing.  She was last seen as a new patient in May 2021.  At that time, she was actually already on allergy shots from her PCPs office.  She had skin testing done, and she had copies of the skin testing.  However, it was not very impressive skin testing at all.  We obtained an environmental allergy panel that was positive only to ragweed at 0.60.  We recommended that she come in for intradermal testing since this is more sensitive.  She was on 2 different allergy injections at her PCPs office, although she was not sure what was in them.  I was very confused about what allergens she was getting in the shots since her environmental testing via the blood was not impressive.  She had been on allergy shots for 1 year.  She also reported a history of an alpha gal allergy.  An alpha gal panel was indeed positive to alpha gal at 2.87, although her IgE's to beef, lamb, and pork were negligible at best.  We recommended continued avoidance because she did react to it fairly regularly.  In the interim, she has done well.  She is here today for intradermal testing to environmental allergens.  When talking about her allergy symptoms.  Even she admits that her symptoms  have not changed after 1 year of being on allergy shots.  Apparently the plan was to retest every year to see where her sensitizations were heading.  She is wondering if this is the routine way to manage allergies and allergy shots.   She is wondering more about her reaction to red meat.  She actually tells me that she has hives, vomiting, diarrhea.  All of these are consistent with alpha gal allergy.  However, she also tells me that she has legitimate fevers up to 104 at times with these episodes.  These only occur when she eats mammalian meat.  She also wants to spend a lot of time discussing fatigue.  She  tells me that she used to be able to wake up at 5 work all day and then come home and work several more hours in the evening without any issues with fatigue.  However, she is just having marked fatigue over the last year or so.  She thinks that she has been tested for thyroid issues, but she is not sure.  She is wondering if she needs to have vitamins and minerals checked in her blood.  She reports good sleep.  However, I think she is having trouble distinguishing deep restorative sleep from on her fatigue.  She tells me that her dog can bounce on her and she wanted to wake up, which "proves" that she has good sleep.  However, we had a discussion about sleep quality.  She did have a sleep study in 2004 prior to her gastric bypass which was reportedly normal.  She is open to doing it again.  She is wondering whether she needs an immune workup. She denies excessive use of antibiotics. She has not required the use of any IV antibiotics.   Otherwise, there have been no changes to her past medical history, surgical history, family history, or social history.    Review of Systems  Constitutional: Positive for fever and malaise/fatigue. Negative for chills and weight loss.  HENT: Positive for congestion and sinus pain. Negative for ear discharge and ear pain.   Eyes: Negative for pain, discharge and redness.  Respiratory: Negative for cough, sputum production, shortness of breath and wheezing.   Cardiovascular: Negative.  Negative for chest pain and palpitations.  Gastrointestinal: Positive for abdominal pain (with meat ingestion), diarrhea (with meat ingestion) and vomiting (with meat ingestion). Negative for constipation, heartburn and nausea.  Skin: Negative.  Negative for itching and rash.  Neurological: Negative for dizziness and headaches.  Endo/Heme/Allergies: Positive for environmental allergies. Does not bruise/bleed easily.       Objective:   Physical Exam: deferred since this was a skin  testing appointment only    Diagnostic studies:    Allergy Studies:    Intradermal - 10/29/19 1100    Time Antigen Placed  1045    Allergen Manufacturer  Greer    Location  Arm    Number of Test  15    Intradermal  Select    Control  Negative    [place]  Negative    [place]  Negative    7 Grass  Negative    Ragweed mix  4+    Weed mix  Negative    Tree mix  Negative    Mold 1  Negative    Mold 2  2+    Mold 3  Negative    Mold 4  3+    Cat  Negative    Dog  3+  Cockroach  Negative    Mite mix  3+       Allergy testing results were read and interpreted by myself, documented by clinical staff.   Total of 30 minutes, greater than 50% of which was spent in discussion of treatment and management options.        Salvatore Marvel, MD  [facility]

## 2019-10-31 DIAGNOSIS — J301 Allergic rhinitis due to pollen: Secondary | ICD-10-CM | POA: Diagnosis not present

## 2019-11-04 DIAGNOSIS — J301 Allergic rhinitis due to pollen: Secondary | ICD-10-CM | POA: Diagnosis not present

## 2019-11-05 LAB — IGG, IGA, IGM
IgA/Immunoglobulin A, Serum: 307 mg/dL (ref 87–352)
IgG (Immunoglobin G), Serum: 770 mg/dL (ref 586–1602)
IgM (Immunoglobulin M), Srm: 191 mg/dL (ref 26–217)

## 2019-11-05 LAB — CBC WITH DIFFERENTIAL
Basophils Absolute: 0 10*3/uL (ref 0.0–0.2)
Basos: 0 %
EOS (ABSOLUTE): 0.1 10*3/uL (ref 0.0–0.4)
Eos: 1 %
Hematocrit: 42.1 % (ref 34.0–46.6)
Hemoglobin: 14.2 g/dL (ref 11.1–15.9)
Immature Grans (Abs): 0 10*3/uL (ref 0.0–0.1)
Immature Granulocytes: 0 %
Lymphocytes Absolute: 1.5 10*3/uL (ref 0.7–3.1)
Lymphs: 12 %
MCH: 30.5 pg (ref 26.6–33.0)
MCHC: 33.7 g/dL (ref 31.5–35.7)
MCV: 91 fL (ref 79–97)
Monocytes Absolute: 0.7 10*3/uL (ref 0.1–0.9)
Monocytes: 6 %
Neutrophils Absolute: 9.8 10*3/uL — ABNORMAL HIGH (ref 1.4–7.0)
Neutrophils: 81 %
RBC: 4.65 x10E6/uL (ref 3.77–5.28)
RDW: 12.3 % (ref 11.7–15.4)
WBC: 12.2 10*3/uL — ABNORMAL HIGH (ref 3.4–10.8)

## 2019-11-05 LAB — STREP PNEUMONIAE 23 SEROTYPES IGG
Pneumo Ab Type 1*: 2.4 ug/mL (ref >1.3)
Pneumo Ab Type 12 (12F)*: 0.3 ug/mL — ABNORMAL LOW (ref >1.3)
Pneumo Ab Type 14*: 11.2 ug/mL (ref >1.3)
Pneumo Ab Type 17 (17F)*: 3.9 ug/mL (ref >1.3)
Pneumo Ab Type 19 (19F)*: 14.3 ug/mL (ref >1.3)
Pneumo Ab Type 2*: 8 ug/mL (ref >1.3)
Pneumo Ab Type 20*: 35 ug/mL (ref >1.3)
Pneumo Ab Type 22 (22F)*: 11.2 ug/mL (ref >1.3)
Pneumo Ab Type 23 (23F)*: 2.4 ug/mL (ref >1.3)
Pneumo Ab Type 26 (6B)*: 1.1 ug/mL — ABNORMAL LOW (ref >1.3)
Pneumo Ab Type 3*: 11.2 ug/mL (ref >1.3)
Pneumo Ab Type 34 (10A)*: 6.5 ug/mL (ref >1.3)
Pneumo Ab Type 4*: 2 ug/mL (ref >1.3)
Pneumo Ab Type 43 (11A)*: 0.7 ug/mL — ABNORMAL LOW (ref >1.3)
Pneumo Ab Type 5*: 7.2 ug/mL (ref >1.3)
Pneumo Ab Type 51 (7F)*: 1.5 ug/mL (ref >1.3)
Pneumo Ab Type 54 (15B)*: 2 ug/mL (ref >1.3)
Pneumo Ab Type 56 (18C)*: >14.4 ug/mL (ref >1.3)
Pneumo Ab Type 57 (19A)*: >33.6 ug/mL (ref >1.3)
Pneumo Ab Type 68 (9V)*: >18.2 ug/mL (ref >1.3)
Pneumo Ab Type 70 (33F)*: 3.6 ug/mL (ref >1.3)
Pneumo Ab Type 8*: 2.8 ug/mL (ref >1.3)
Pneumo Ab Type 9 (9N)*: 23.4 ug/mL (ref >1.3)

## 2019-11-05 LAB — THYROID PANEL WITH TSH
Free Thyroxine Index: 2.1 (ref 1.2–4.9)
T3 Uptake Ratio: 28 % (ref 24–39)
T4, Total: 7.5 ug/dL (ref 4.5–12.0)
TSH: 1.42 u[IU]/mL (ref 0.450–4.500)

## 2019-11-05 LAB — COMPLEMENT, TOTAL: Compl, Total (CH50): >60 U/mL (ref >41)

## 2019-11-05 LAB — DIPHTHERIA / TETANUS ANTIBODY PANEL
Diphtheria Ab: 0.62 IU/mL (ref <0.10)
Tetanus Ab, IgG: 1.35 IU/mL (ref <0.10)

## 2019-11-07 DIAGNOSIS — J301 Allergic rhinitis due to pollen: Secondary | ICD-10-CM | POA: Diagnosis not present

## 2019-11-10 ENCOUNTER — Encounter: Payer: Self-pay | Admitting: Allergy & Immunology

## 2019-11-11 DIAGNOSIS — J301 Allergic rhinitis due to pollen: Secondary | ICD-10-CM | POA: Diagnosis not present

## 2019-12-03 ENCOUNTER — Ambulatory Visit: Payer: Medicare Other | Admitting: Family Medicine

## 2019-12-15 ENCOUNTER — Encounter: Payer: Self-pay | Admitting: Neurology

## 2019-12-15 ENCOUNTER — Other Ambulatory Visit: Payer: Self-pay | Admitting: Neurology

## 2019-12-15 ENCOUNTER — Other Ambulatory Visit: Payer: Self-pay

## 2019-12-15 ENCOUNTER — Ambulatory Visit (INDEPENDENT_AMBULATORY_CARE_PROVIDER_SITE_OTHER): Payer: Medicare Other | Admitting: Neurology

## 2019-12-15 DIAGNOSIS — M48062 Spinal stenosis, lumbar region with neurogenic claudication: Secondary | ICD-10-CM | POA: Diagnosis not present

## 2019-12-15 DIAGNOSIS — R252 Cramp and spasm: Secondary | ICD-10-CM

## 2019-12-15 DIAGNOSIS — M48061 Spinal stenosis, lumbar region without neurogenic claudication: Secondary | ICD-10-CM

## 2019-12-15 HISTORY — DX: Spinal stenosis, lumbar region without neurogenic claudication: M48.061

## 2019-12-15 HISTORY — DX: Cramp and spasm: R25.2

## 2019-12-15 NOTE — Patient Instructions (Signed)
Try magnesium tablets for the muscle cramps.

## 2019-12-15 NOTE — Progress Notes (Signed)
Reason for visit: Chronic low back pain, lumbar spinal stenosis  Patricia Eaton is an 69 y.o. female  History of present illness:  Patricia Eaton is a 69 year old right-handed white female with a history of chronic low back pain, she has had documented moderate to severe spinal stenosis at the L2-3 level with facet joint arthritis and neuroforaminal narrowing at this level as well.  The patient reports ongoing problems with low back pain and some discomfort into the hips and thighs, she will have muscle cramps that affect the thighs, left greater than right lower extremity.  She may have episodes twice a week that may occur during the day or during the night.  She has had an epidural steroid injection in January 2021 which resulted in some benefit for about a month.  The patient has tizanidine that she may take at night if she has muscle cramps.  She tries to stay active, she swims on a regular basis.  Past Medical History:  Diagnosis Date  . Angio-edema   . Anxiety   . Asthma    childhood asthma  . Colonic mass    In [place], found inconclusive  . Eczema   . Environmental allergies    trees, grass, oak trees  . GERD (gastroesophageal reflux disease)   . Neuropathy   . Numbness   . Pulmonary embolism (HCC)   . Seasonal allergies     Past Surgical History:  Procedure Laterality Date  . APPENDECTOMY    . COLONOSCOPY  2018   [place]: diverticulosis  . ESOPHAGOGASTRODUODENOSCOPY N/A 01/09/2018   normal esophagus, normal residual stomach status post gastric obesity procedure.  Marland Kitchen GASTRIC BYPASS  2003  . LUMBAR FUSION  2016  . PARTIAL HYSTERECTOMY  1980  . TONSILLECTOMY    . WRIST FRACTURE SURGERY  2013    Family History  Problem Relation Age of Onset  . Colon cancer Maternal Aunt   . Crohn's disease Grandchild   . Crohn's disease Son   . Cancer Mother        kidney or liver  . Heart failure Father   . Heart attack Father   . Pancreatic cancer Sister   .  Stroke Maternal Grandmother   . Allergic rhinitis Neg Hx   . Asthma Neg Hx   . Eczema Neg Hx   . Urticaria Neg Hx     Social history:  reports that she quit smoking about 22 years ago. Her smoking use included cigarettes. She has never used smokeless tobacco. She reports current alcohol use. She reports that she does not use drugs.    Allergies  Allergen Reactions  . Molds & Smuts Cough    And grass; cause sneezing and watery eyes, too  . Other Rash    Oak causes rash    Medications:  Prior to Admission medications   Medication Sig Start Date End Date Taking? Authorizing Provider  aspirin EC 81 MG tablet Take 81 mg by mouth daily.   Yes [provider]  CALCIUM PO Take 500 mg by mouth daily.    Yes [provider]  cholecalciferol (VITAMIN D) 1000 units tablet Take 5,000 Units by mouth daily.    Yes [provider]  cyanocobalamin (,VITAMIN B-12,) 1000 MCG/ML injection Inject 1,000 mcg into the muscle every 30 (thirty) days. 12/26/17  Yes [provider]  ferrous sulfate 325 (65 FE) MG tablet Take 325 mg by mouth daily with breakfast.   Yes [provider]  fluticasone (FLONASE) 50 MCG/ACT nasal spray Place 2 sprays into both nostrils daily. 06/20/18  Yes [provider]  omeprazole (PRILOSEC) 40 MG capsule Take 1 capsule (40 mg total) by mouth daily. 08/28/19  Yes Harper, Gwendalyn Ege, PA-C  pregabalin (LYRICA) 100 MG capsule TAKE 1 CAPSULE EVERY MORNING AND 2 CAPSULES IN THE EVENING 08/28/19  Yes York Spaniel, MD  PRESCRIPTION MEDICATION Allergy shot three times weekly   Yes [provider]  tiZANidine (ZANAFLEX) 4 MG tablet Take 1 tablet by mouth daily as needed. 07/11/18  Yes [provider]  trimethoprim (TRIMPEX) 100 MG tablet Take 100 mg by mouth daily. 07/05/18  Yes [provider]    ROS:  Out of a complete 14 system review of symptoms, the patient complains only of the following symptoms, and all  other reviewed systems are negative.  Low back pain, leg pain  Blood pressure 140/87, pulse 76, height 5\' 7"  (1.702 m), weight 201 lb (91.2 kg).  Physical Exam  General: The patient is alert and cooperative at the time of the examination.  Neuromuscular: The patient lacks about 15 degrees of full flexion of the lumbar spine.  Internal and external rotation of the hips does not elicit pain.  Skin: No significant peripheral edema is noted.   Neurologic Exam  Mental status: The patient is alert and oriented x 3 at the time of the examination. The patient has apparent normal recent and remote memory, with an apparently normal attention span and concentration ability.   Cranial nerves: Facial symmetry is present. Speech is normal, no aphasia or dysarthria is noted. Extraocular movements are full. Visual fields are full.  Motor: The patient has good strength in all 4 extremities.  Sensory examination: Soft touch sensation is symmetric on the face, arms, and legs.  Coordination: The patient has good finger-nose-finger and heel-to-shin bilaterally.  Gait and station: The patient has a normal gait. Tandem gait is normal. Romberg is negative. No drift is seen.  Reflexes: Deep tendon reflexes are symmetric.   Assessment/Plan:  1.  Lumbar spinal stenosis, L2-3 level  2.  Leg cramps  The patient will try magnesium supplementation and hydration to treat the muscle cramps of the legs.  We will do another epidural steroid injection.  She will follow-up here in about 6 months.  The patient does not wish to consider surgery on the low back.  MD 12/15/2019 7:54 AM  [facility] [address] [place], [place]  Phone [phone] Fax [phone]

## 2019-12-18 DIAGNOSIS — J3089 Other allergic rhinitis: Secondary | ICD-10-CM | POA: Diagnosis not present

## 2019-12-18 DIAGNOSIS — J301 Allergic rhinitis due to pollen: Secondary | ICD-10-CM | POA: Diagnosis not present

## 2019-12-18 DIAGNOSIS — J3081 Allergic rhinitis due to animal (cat) (dog) hair and dander: Secondary | ICD-10-CM | POA: Diagnosis not present

## 2019-12-18 DIAGNOSIS — Z91018 Allergy to other foods: Secondary | ICD-10-CM | POA: Diagnosis not present

## 2019-12-23 ENCOUNTER — Other Ambulatory Visit: Payer: Self-pay

## 2019-12-23 ENCOUNTER — Ambulatory Visit
Admission: RE | Admit: 2019-12-23 | Discharge: 2019-12-23 | Disposition: A | Discharge status: Home / Self Care | Payer: Medicare Other | Source: Ambulatory Visit | Attending: Neurology | Admitting: Neurology

## 2019-12-23 ENCOUNTER — Other Ambulatory Visit: Payer: Self-pay | Admitting: Gastroenterology

## 2019-12-23 DIAGNOSIS — K2100 Gastro-esophageal reflux disease with esophagitis, without bleeding: Secondary | ICD-10-CM

## 2019-12-23 DIAGNOSIS — M48062 Spinal stenosis, lumbar region with neurogenic claudication: Secondary | ICD-10-CM

## 2019-12-23 DIAGNOSIS — M4326 Fusion of spine, lumbar region: Secondary | ICD-10-CM | POA: Diagnosis not present

## 2019-12-23 MED ORDER — METHYLPREDNISOLONE ACETATE 40 MG/ML INJ SUSP (RADIOLOG
120.0000 mg | Freq: Once | INTRAMUSCULAR | Status: AC
  Administered 2019-12-23: 120 mg via EPIDURAL

## 2019-12-23 MED ORDER — IOPAMIDOL (ISOVUE-M 200) INJECTION 41%
1.0000 mL | Freq: Once | INTRAMUSCULAR | Status: AC
  Administered 2019-12-23: 1 mL via EPIDURAL

## 2019-12-23 NOTE — Discharge Instructions (Signed)

## 2019-12-29 DIAGNOSIS — N3020 Other chronic cystitis without hematuria: Secondary | ICD-10-CM | POA: Diagnosis not present

## 2019-12-29 DIAGNOSIS — J301 Allergic rhinitis due to pollen: Secondary | ICD-10-CM | POA: Diagnosis not present

## 2019-12-29 DIAGNOSIS — J3089 Other allergic rhinitis: Secondary | ICD-10-CM | POA: Diagnosis not present

## 2019-12-29 DIAGNOSIS — J3081 Allergic rhinitis due to animal (cat) (dog) hair and dander: Secondary | ICD-10-CM | POA: Diagnosis not present

## 2019-12-31 DIAGNOSIS — I700 Atherosclerosis of aorta: Secondary | ICD-10-CM | POA: Diagnosis not present

## 2019-12-31 DIAGNOSIS — R350 Frequency of micturition: Secondary | ICD-10-CM | POA: Diagnosis not present

## 2019-12-31 DIAGNOSIS — K5730 Diverticulosis of large intestine without perforation or abscess without bleeding: Secondary | ICD-10-CM | POA: Diagnosis not present

## 2019-12-31 DIAGNOSIS — K449 Diaphragmatic hernia without obstruction or gangrene: Secondary | ICD-10-CM | POA: Diagnosis not present

## 2019-12-31 DIAGNOSIS — R3129 Other microscopic hematuria: Secondary | ICD-10-CM | POA: Diagnosis not present

## 2019-12-31 DIAGNOSIS — N3020 Other chronic cystitis without hematuria: Secondary | ICD-10-CM | POA: Diagnosis not present

## 2019-12-31 DIAGNOSIS — R1031 Right lower quadrant pain: Secondary | ICD-10-CM | POA: Diagnosis not present

## 2020-01-06 ENCOUNTER — Ambulatory Visit: Payer: Medicare Other | Admitting: Family Medicine

## 2020-01-07 DIAGNOSIS — J3081 Allergic rhinitis due to animal (cat) (dog) hair and dander: Secondary | ICD-10-CM | POA: Diagnosis not present

## 2020-01-07 DIAGNOSIS — J3089 Other allergic rhinitis: Secondary | ICD-10-CM | POA: Diagnosis not present

## 2020-01-07 DIAGNOSIS — J301 Allergic rhinitis due to pollen: Secondary | ICD-10-CM | POA: Diagnosis not present

## 2020-01-09 DIAGNOSIS — J301 Allergic rhinitis due to pollen: Secondary | ICD-10-CM | POA: Diagnosis not present

## 2020-01-09 DIAGNOSIS — J3081 Allergic rhinitis due to animal (cat) (dog) hair and dander: Secondary | ICD-10-CM | POA: Diagnosis not present

## 2020-01-09 DIAGNOSIS — J3089 Other allergic rhinitis: Secondary | ICD-10-CM | POA: Diagnosis not present

## 2020-01-13 ENCOUNTER — Encounter: Payer: Self-pay | Admitting: Family Medicine

## 2020-01-13 ENCOUNTER — Ambulatory Visit (INDEPENDENT_AMBULATORY_CARE_PROVIDER_SITE_OTHER): Payer: Medicare Other | Admitting: Family Medicine

## 2020-01-13 ENCOUNTER — Other Ambulatory Visit: Payer: Self-pay

## 2020-01-13 VITALS — BP 117/77 | HR 76 | Ht 67.0 in | Wt 202.4 lb

## 2020-01-13 DIAGNOSIS — Z0000 Encounter for general adult medical examination without abnormal findings: Secondary | ICD-10-CM | POA: Diagnosis not present

## 2020-01-13 DIAGNOSIS — K219 Gastro-esophageal reflux disease without esophagitis: Secondary | ICD-10-CM

## 2020-01-13 DIAGNOSIS — Z8249 Family history of ischemic heart disease and other diseases of the circulatory system: Secondary | ICD-10-CM | POA: Diagnosis not present

## 2020-01-13 DIAGNOSIS — R5382 Chronic fatigue, unspecified: Secondary | ICD-10-CM | POA: Diagnosis not present

## 2020-01-13 DIAGNOSIS — J3081 Allergic rhinitis due to animal (cat) (dog) hair and dander: Secondary | ICD-10-CM | POA: Diagnosis not present

## 2020-01-13 DIAGNOSIS — J301 Allergic rhinitis due to pollen: Secondary | ICD-10-CM | POA: Diagnosis not present

## 2020-01-13 DIAGNOSIS — J3089 Other allergic rhinitis: Secondary | ICD-10-CM | POA: Diagnosis not present

## 2020-01-13 NOTE — Progress Notes (Signed)
Office Visit Note   Patient: Patricia Eaton           Date of Birth: Feb 23, 1951           MRN: [id number] Visit Date: 01/13/2020 Requested by: Kirstie Peri, MD [address] [place],  [place] [address] PCP: Kirstie Peri, MD  Subjective: Chief Complaint  Patient presents with  . establish primary care    HPI: She is here to establish care.  Her main concern is chronic fatigue.  She had a tick bite several years ago and has been diagnosed with alpha gal syndrome.  She wonders whether she might also have chronic Lyme disease.  One of her Lyme test was equivocal.  She was treated initially with doxycycline for about 5 days after she found the tick.  She states that she has had intermittent sweats since then, about once per month.  They are often associated with a sensation of fever although most of the time her temperature is normal.  She has a history of diverticulitis.  She also had appendectomy and was found to have a tumor of some sort.  She had colonoscopy every year for 5 years after that and now gets them periodically.  She is due for another one.  She has a history of palpitations and a family history of heart disease in her father.  She has a history of pulmonary embolism.  She is due for cardiology evaluation.  She is not having any symptoms today.  She has GERD and is s/p gastric bypass.  Takes PPI chronically.  History of PE in the past.  Chronic LBP with stenosis.                 ROS:   All other systems were reviewed and are negative.  Objective: Vital Signs: BP 117/77   Pulse 76   Ht 5\' 7"  (1.702 m)   Wt 202 lb 6.4 oz (91.8 kg)   BMI 31.70 kg/m   Physical Exam:  General:  Alert and oriented, in no acute distress. Pulm:  Breathing unlabored. Psy:  Normal mood, congruent affect.  Neck:  No lymphadenopathy, no thyromegaly, no bruits. CV: Regular rate and rhythm without murmurs, rubs, or gallops.  No peripheral edema.  2+ radial and posterior tibial  pulses. Lungs: Clear to auscultation throughout with no wheezing or areas of consolidation.    Imaging: No results found.  Assessment & Plan: 1.  Visit to establish care - Will review old records.  2.  Chronic fatigue - Etiology uncertain.  Cannot rule out chronic lyme.  Will do more research.  3.  History of CAD in father.  - Will refer to cardiology.  4.  GERD - Will consider trying an elimination diet.       Procedures: No procedures performed  No notes on file     PMFS History: Patient Active Problem List   Diagnosis Date Noted  . Lumbar spinal stenosis 12/15/2019  . Leg cramps 12/15/2019  . Paresthesia 06/13/2018  . Lumbar radiculopathy 06/13/2018  . Abdominal pain 03/07/2018  . Nausea with vomiting 12/04/2017  . Constipation 08/29/2017  . History of diverticulitis 08/29/2017  . GERD (gastroesophageal reflux disease) 08/29/2017  . Chronic low back pain 06/15/2017  . Heart palpitations 08/24/2016  . Chest pain, pleuritic 11/18/2015  . Thrombophilia (HCC) 09/23/2015  . Lung infiltrate 09/23/2014  . Ex-smoker 09/02/2014  . Reactive airway disease 09/02/2014  . Shortness of breath 09/02/2014  . History of pneumonia 08/31/2014  .  History of pulmonary embolism 08/31/2014  . Anemia due to blood loss, acute 06/25/2014  . Neuropathic pain of lower extremity 12/21/2013  . Disc herniation 11/05/2012   Past Medical History:  Diagnosis Date  . Angio-edema   . Anxiety   . Asthma    childhood asthma  . Colonic mass    In [place], found inconclusive  . Eczema   . Environmental allergies    trees, grass, oak trees  . GERD (gastroesophageal reflux disease)   . Leg cramps 12/15/2019  . Lumbar spinal stenosis 12/15/2019   L2-3 level  . Neuropathy   . Numbness   . Pulmonary embolism (HCC)   . Seasonal allergies     Family History  Problem Relation Age of Onset  . Colon cancer Maternal Aunt   . Crohn's disease Grandchild   . Crohn's disease Son   . Cancer  Mother        kidney or liver  . Heart failure Father   . Heart attack Father   . Pancreatic cancer Sister   . Stroke Maternal Grandmother   . Allergic rhinitis Neg Hx   . Asthma Neg Hx   . Eczema Neg Hx   . Urticaria Neg Hx     Past Surgical History:  Procedure Laterality Date  . APPENDECTOMY    . COLONOSCOPY  2018   [place]: diverticulosis  . ESOPHAGOGASTRODUODENOSCOPY N/A 01/09/2018   normal esophagus, normal residual stomach status post gastric obesity procedure.  Marland Kitchen GASTRIC BYPASS  2003  . LUMBAR FUSION  2016  . PARTIAL HYSTERECTOMY  1980  . TONSILLECTOMY    . WRIST FRACTURE SURGERY  2013   Social History   Occupational History  . Occupation: Retired from trucking  Tobacco Use  . Smoking status: Former Smoker    Types: Cigarettes    Quit date: 05/29/1997    Years since quitting: 22.6  . Smokeless tobacco: Never Used  Vaping Use  . Vaping Use: Never used  Substance and Sexual Activity  . Alcohol use: Yes    Comment: occasional  . Drug use: Never  . Sexual activity: Not on file

## 2020-01-14 ENCOUNTER — Telehealth: Payer: Self-pay | Admitting: Family Medicine

## 2020-01-14 MED ORDER — IVERMECTIN 3 MG PO TABS: ORAL_TABLET | ORAL | 0 refills | Status: DC

## 2020-01-14 NOTE — Addendum Note (Signed)
Addended by: Lillia Carmel on: 01/14/2020 05:43 PM   Modules accepted: Orders

## 2020-01-14 NOTE — Telephone Encounter (Signed)
The patient was in a meeting 2 days ago. She received a phone call today that one of the meeting participants has tested positive for covid. No masks were worn, but they were spaced out - the patient was > 6 feet away from him. The patient has no symptoms. She would like advice on what she should do at this point. It is ok to respond via MyChart.

## 2020-01-14 NOTE — Telephone Encounter (Signed)
Patient called requesting a call back. Patient states she was exposed to covid and would like to speak to the nurse about it. Please call patient at [phone].

## 2020-01-15 ENCOUNTER — Telehealth: Payer: Self-pay | Admitting: Family Medicine

## 2020-01-15 ENCOUNTER — Ambulatory Visit: Payer: Medicare Other | Admitting: Cardiovascular Disease

## 2020-01-15 DIAGNOSIS — J301 Allergic rhinitis due to pollen: Secondary | ICD-10-CM | POA: Diagnosis not present

## 2020-01-15 DIAGNOSIS — J3089 Other allergic rhinitis: Secondary | ICD-10-CM | POA: Diagnosis not present

## 2020-01-15 DIAGNOSIS — J3081 Allergic rhinitis due to animal (cat) (dog) hair and dander: Secondary | ICD-10-CM | POA: Diagnosis not present

## 2020-01-15 NOTE — Telephone Encounter (Signed)
Error

## 2020-01-20 DIAGNOSIS — J301 Allergic rhinitis due to pollen: Secondary | ICD-10-CM | POA: Diagnosis not present

## 2020-01-20 DIAGNOSIS — J3089 Other allergic rhinitis: Secondary | ICD-10-CM | POA: Diagnosis not present

## 2020-01-20 DIAGNOSIS — J3081 Allergic rhinitis due to animal (cat) (dog) hair and dander: Secondary | ICD-10-CM | POA: Diagnosis not present

## 2020-01-22 ENCOUNTER — Telehealth: Payer: Self-pay | Admitting: Internal Medicine

## 2020-01-22 DIAGNOSIS — J3081 Allergic rhinitis due to animal (cat) (dog) hair and dander: Secondary | ICD-10-CM | POA: Diagnosis not present

## 2020-01-22 DIAGNOSIS — J3089 Other allergic rhinitis: Secondary | ICD-10-CM | POA: Diagnosis not present

## 2020-01-22 DIAGNOSIS — J301 Allergic rhinitis due to pollen: Secondary | ICD-10-CM | POA: Diagnosis not present

## 2020-01-22 NOTE — Telephone Encounter (Signed)
Hey Dr. Leone Payor, patient is being referred for a colonoscopy. [place] She is requesting you as her doctor. I have the records and will send them up to you for review. Please advise. Thank you!

## 2020-01-27 DIAGNOSIS — J3089 Other allergic rhinitis: Secondary | ICD-10-CM | POA: Diagnosis not present

## 2020-01-27 DIAGNOSIS — J301 Allergic rhinitis due to pollen: Secondary | ICD-10-CM | POA: Diagnosis not present

## 2020-01-27 DIAGNOSIS — J3081 Allergic rhinitis due to animal (cat) (dog) hair and dander: Secondary | ICD-10-CM | POA: Diagnosis not present

## 2020-01-28 ENCOUNTER — Encounter: Payer: Self-pay | Admitting: Internal Medicine

## 2020-01-28 NOTE — Telephone Encounter (Signed)
Appointment scheduled on 11/4.

## 2020-01-28 NOTE — Telephone Encounter (Signed)
Thanks

## 2020-01-28 NOTE — Telephone Encounter (Signed)
Spoke with patient, she did want to come in and meet Dr. Leone Payor and just kind of discuss her history with him and establish care.

## 2020-01-28 NOTE — Telephone Encounter (Signed)
Pleaase tell her that I reviewed her chart and based upon what I saw and current guidelines she can wait until January 2028 for a repeat colonoscopy.  Note that she had colon surgery for a mass but that was not a tumor it was really complicated appendicitis.  So every 10 year colonoscopy makes sense  If she has lingering questions or other problems that she wants to be seen for she may schedule an office visit  We will place 05/2026 recall and I updated her Health Maintenance section

## 2020-01-29 DIAGNOSIS — J3089 Other allergic rhinitis: Secondary | ICD-10-CM | POA: Diagnosis not present

## 2020-01-29 DIAGNOSIS — J3081 Allergic rhinitis due to animal (cat) (dog) hair and dander: Secondary | ICD-10-CM | POA: Diagnosis not present

## 2020-01-29 DIAGNOSIS — J301 Allergic rhinitis due to pollen: Secondary | ICD-10-CM | POA: Diagnosis not present

## 2020-02-04 DIAGNOSIS — J3089 Other allergic rhinitis: Secondary | ICD-10-CM | POA: Diagnosis not present

## 2020-02-04 DIAGNOSIS — J3081 Allergic rhinitis due to animal (cat) (dog) hair and dander: Secondary | ICD-10-CM | POA: Diagnosis not present

## 2020-02-04 DIAGNOSIS — J301 Allergic rhinitis due to pollen: Secondary | ICD-10-CM | POA: Diagnosis not present

## 2020-02-06 DIAGNOSIS — J3089 Other allergic rhinitis: Secondary | ICD-10-CM | POA: Diagnosis not present

## 2020-02-06 DIAGNOSIS — J301 Allergic rhinitis due to pollen: Secondary | ICD-10-CM | POA: Diagnosis not present

## 2020-02-06 DIAGNOSIS — J3081 Allergic rhinitis due to animal (cat) (dog) hair and dander: Secondary | ICD-10-CM | POA: Diagnosis not present

## 2020-02-10 DIAGNOSIS — J3081 Allergic rhinitis due to animal (cat) (dog) hair and dander: Secondary | ICD-10-CM | POA: Diagnosis not present

## 2020-02-10 DIAGNOSIS — J3089 Other allergic rhinitis: Secondary | ICD-10-CM | POA: Diagnosis not present

## 2020-02-10 DIAGNOSIS — J301 Allergic rhinitis due to pollen: Secondary | ICD-10-CM | POA: Diagnosis not present

## 2020-02-10 NOTE — Progress Notes (Signed)
CARDIOLOGY CONSULT NOTE       Patient ID: Patricia Eaton MRN: [id number] DOB/AGE: 1950-09-27 69 y.o.  Admit date: (Not on file) Referring Physician: [name] Primary Physician: Kirstie Peri, MD Primary Cardiologist: New Reason for Consultation: Family history of CAD  Active Problems:   * No active hospital problems. *   HPI:  69 y.o. referred by Dr Prince Rome for family history of CAD. She established care with him on 01/11/20. She has distant history of tick bite Rx doxycycline lyme testing equivocal. 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 Monitor 2019 no significant arrhythmiias. Rx with PRN atenolol Seemed to be stress induced She has GERD post gastric bypass. Also distant history of PE in 2016    Former smoker quit in 1999  07/16/17 had echo for chest pain and dyspnea read as EF 50-55% with trivial MR  Seen by Dr Purvis Sheffield 07/25/18 some dizziness not postural and not thought to be cardiac Note indicates monitor but not done   Cardiac CTA 10/15/17 with calcium score 62 which was 74 th percentile no obstructive CAD isolated to LAD left dominant system  Some cervical /lumbar spine issues previous L4S1 fusion Last injection cervical spine done 12/23/19   She gets some dependant edema. Feels fatigue Has not had recent lipids   ROS All other systems reviewed and negative except as noted above  Past Medical History:  Diagnosis Date  . Angio-edema   . Anxiety   . Asthma    childhood asthma  . Colonic mass    In [place], found inconclusive  . Eczema   . Environmental allergies    trees, grass, oak trees  . GERD (gastroesophageal reflux disease)   . Leg cramps 12/15/2019  . Lumbar spinal stenosis 12/15/2019   L2-3 level  . Neuropathy   . Numbness   . Pulmonary embolism (HCC)   . Seasonal allergies     Family History  Problem Relation Age of Onset  .  Colon cancer Maternal Aunt   . Crohn's disease Grandchild   . Crohn's disease Son   . Cancer Mother        kidney or liver  . Heart failure Father   . Heart attack Father   . Pancreatic cancer Sister   . Stroke Maternal Grandmother   . Allergic rhinitis Neg Hx   . Asthma Neg Hx   . Eczema Neg Hx   . Urticaria Neg Hx     Social History   Socioeconomic History  . Marital status: Married    Spouse name: Not on file  . Number of children: 3  . Years of education: 12  . Highest education level: High school graduate  Occupational History  . Occupation: Retired from trucking  Tobacco Use  . Smoking status: Former Smoker    Types: Cigarettes    Quit date: 05/29/1997    Years since quitting: 22.7  . Smokeless tobacco: Never Used  Vaping Use  . Vaping Use: Never used  Substance and Sexual Activity  . Alcohol use: Yes    Comment: occasional  . Drug use: Never  . Sexual activity: Not on file  Other Topics Concern  . Not on file  Social History Narrative   Lives at home with husband.   Right-handed   Caffeine: 2 cups per day.    Social Determinants of Health   Financial  Resource Strain:   . Difficulty of Paying Living Expenses: Not on file  Food Insecurity:   . Worried About Programme researcher, broadcasting/film/video in the Last Year: Not on file  . Ran Out of Food in the Last Year: Not on file  Transportation Needs:   . Lack of Transportation (Medical): Not on file  . Lack of Transportation (Non-Medical): Not on file  Physical Activity:   . Days of Exercise per Week: Not on file  . Minutes of Exercise per Session: Not on file  Stress:   . Feeling of Stress : Not on file  Social Connections:   . Frequency of Communication with Friends and Family: Not on file  . Frequency of Social Gatherings with Friends and Family: Not on file  . Attends Religious Services: Not on file  . Active Member of Clubs or Organizations: Not on file  . Attends Banker Meetings: Not on file  . Marital  Status: Not on file  Intimate Partner Violence:   . Fear of Current or Ex-Partner: Not on file  . Emotionally Abused: Not on file  . Physically Abused: Not on file  . Sexually Abused: Not on file    Past Surgical History:  Procedure Laterality Date  . APPENDECTOMY    . COLONOSCOPY  2018   [place]: diverticulosis  . ESOPHAGOGASTRODUODENOSCOPY N/A 01/09/2018   normal esophagus, normal residual stomach status post gastric obesity procedure.  Marland Kitchen GASTRIC BYPASS  2003  . LUMBAR FUSION  2016  . PARTIAL HYSTERECTOMY  1980  . TONSILLECTOMY    . WRIST FRACTURE SURGERY  2013      Current Outpatient Medications:  .  Ascorbic Acid (VITAMIN C WITH ROSE HIPS) 1000 MG tablet, Take 1,000 mg by mouth daily., Disp: , Rfl:  .  aspirin EC 81 MG tablet, Take 81 mg by mouth daily., Disp: , Rfl:  .  CALCIUM PO, Take 500 mg by mouth daily. , Disp: , Rfl:  .  Cholecalciferol (D3 HIGH POTENCY) 25 MCG (1000 UT) capsule, Take 1,000 Units by mouth daily., Disp: , Rfl:  .  cholecalciferol (VITAMIN D) 1000 units tablet, Take 5,000 Units by mouth daily. , Disp: , Rfl:  .  cyanocobalamin (,VITAMIN B-12,) 1000 MCG/ML injection, Inject 1,000 mcg into the muscle every 30 (thirty) days. , Disp: , Rfl: 0 .  ferrous sulfate 325 (65 FE) MG tablet, Take 325 mg by mouth daily with breakfast., Disp: , Rfl:  .  fluticasone (FLONASE) 50 MCG/ACT nasal spray, Place 2 sprays into both nostrils daily., Disp: , Rfl:  .  melatonin 3 MG TABS tablet, Take 6 mg by mouth at bedtime., Disp: , Rfl:  .  pregabalin (LYRICA) 100 MG capsule, TAKE 1 CAPSULE EVERY MORNING AND 2 CAPSULES IN THE EVENING, Disp: 270 capsule, Rfl: 1 .  PRESCRIPTION MEDICATION, Allergy shot three times weekly, Disp: , Rfl:  .  Quercetin 250 MG TABS, Take by mouth., Disp: , Rfl:  .  tiZANidine (ZANAFLEX) 4 MG tablet, Take 1 tablet by mouth daily as needed., Disp: , Rfl:  .  trimethoprim (TRIMPEX) 100 MG tablet, Take 100 mg by mouth daily., Disp: , Rfl:  .  zinc  gluconate 50 MG tablet, Take 50 mg by mouth daily., Disp: , Rfl:     Physical Exam: Blood pressure 124/70, pulse 70, height 5\' 7"  (1.702 m), weight 205 lb (93 kg), SpO2 96 %.   Affect appropriate Healthy:  appears stated age HEENT: normal Neck supple  with no adenopathy JVP normal no bruits no thyromegaly Lungs clear with no wheezing and good diaphragmatic motion Heart:  S1/S2 no murmur, no rub, gallop or click PMI normal Abdomen: benighn, BS positve, no tenderness, no AAA no bruit.  No HSM or HJR Distal pulses intact with no bruits No edema Neuro non-focal Skin warm and dry No muscular weakness   Labs:   Lab Results  Component Value Date   WBC 12.2 (H) 10/29/2019   HGB 14.2 10/29/2019   HCT 42.1 10/29/2019   MCV 91 10/29/2019   No results for input(s): NA, K, CL, CO2, BUN, CREATININE, CALCIUM, PROT, BILITOT, ALKPHOS, ALT, AST, GLUCOSE in the last 168 hours.  Invalid input(s): LABALBU No results found for: CKTOTAL, CKMB, CKMBINDEX, TROPONINI No results found for: CHOL No results found for: HDL No results found for: LDLCALC No results found for: TRIG No results found for: CHOLHDL No results found for: LDLDIRECT    Radiology: No results found.  EKG: 07/25/18 NSR rate 66 normal  02/24/20 SR rate 70 nonspecific ST changes    ASSESSMENT AND PLAN:   1. CAD: non obstructive left dominant system with calcium score 62 isolated to LAD on CT 10/15/17  On ASA 81 mg Not on statin No lipids in chart will check and start statin if LDL > 75  2. GERD: post gastric bypass on Prilosec   3. Ortho:  Lumbar and cervical spine issues post fusion and injection Rx f/u IR radiology and neuro continue Lyrica  4. Fatigue:  Functional rhythm and vitals fine exam fine check echo make sure EF still normal   PRN HCTZ 12.5 mg  Lipid/ Liver Echo  F/U [place] 6 months   Signed: Charlton Haws 02/24/2020, 11:22 AM

## 2020-02-12 DIAGNOSIS — J301 Allergic rhinitis due to pollen: Secondary | ICD-10-CM | POA: Diagnosis not present

## 2020-02-12 DIAGNOSIS — J3081 Allergic rhinitis due to animal (cat) (dog) hair and dander: Secondary | ICD-10-CM | POA: Diagnosis not present

## 2020-02-12 DIAGNOSIS — J3089 Other allergic rhinitis: Secondary | ICD-10-CM | POA: Diagnosis not present

## 2020-02-17 DIAGNOSIS — J3081 Allergic rhinitis due to animal (cat) (dog) hair and dander: Secondary | ICD-10-CM | POA: Diagnosis not present

## 2020-02-17 DIAGNOSIS — J301 Allergic rhinitis due to pollen: Secondary | ICD-10-CM | POA: Diagnosis not present

## 2020-02-17 DIAGNOSIS — J3089 Other allergic rhinitis: Secondary | ICD-10-CM | POA: Diagnosis not present

## 2020-02-19 DIAGNOSIS — J3081 Allergic rhinitis due to animal (cat) (dog) hair and dander: Secondary | ICD-10-CM | POA: Diagnosis not present

## 2020-02-19 DIAGNOSIS — J301 Allergic rhinitis due to pollen: Secondary | ICD-10-CM | POA: Diagnosis not present

## 2020-02-19 DIAGNOSIS — J3089 Other allergic rhinitis: Secondary | ICD-10-CM | POA: Diagnosis not present

## 2020-02-24 ENCOUNTER — Encounter: Payer: Self-pay | Admitting: Cardiovascular Disease

## 2020-02-24 ENCOUNTER — Other Ambulatory Visit: Payer: Self-pay

## 2020-02-24 ENCOUNTER — Ambulatory Visit (INDEPENDENT_AMBULATORY_CARE_PROVIDER_SITE_OTHER): Payer: Medicare Other | Admitting: Cardiovascular Disease

## 2020-02-24 VITALS — BP 124/70 | HR 70 | Ht 67.0 in | Wt 205.0 lb

## 2020-02-24 DIAGNOSIS — Z8249 Family history of ischemic heart disease and other diseases of the circulatory system: Secondary | ICD-10-CM

## 2020-02-24 DIAGNOSIS — J301 Allergic rhinitis due to pollen: Secondary | ICD-10-CM | POA: Diagnosis not present

## 2020-02-24 DIAGNOSIS — R931 Abnormal findings on diagnostic imaging of heart and coronary circulation: Secondary | ICD-10-CM | POA: Diagnosis not present

## 2020-02-24 DIAGNOSIS — I2510 Atherosclerotic heart disease of native coronary artery without angina pectoris: Secondary | ICD-10-CM

## 2020-02-24 DIAGNOSIS — J3081 Allergic rhinitis due to animal (cat) (dog) hair and dander: Secondary | ICD-10-CM | POA: Diagnosis not present

## 2020-02-24 DIAGNOSIS — J3089 Other allergic rhinitis: Secondary | ICD-10-CM | POA: Diagnosis not present

## 2020-02-24 MED ORDER — HYDROCHLOROTHIAZIDE 12.5 MG PO CAPS: 12.5000 mg | ORAL_CAPSULE | Freq: Every day | ORAL | 3 refills | Status: DC | PRN

## 2020-02-24 NOTE — Patient Instructions (Addendum)
Medication Instructions:  Your physician has recommended you make the following change in your medication:  1-START hydrochlorothiazide 12.5 mg by mouth daily as needed for edema.  *If you need a refill on your cardiac medications before your next appointment, please call your pharmacy*  Lab Work: Your physician recommends that you return for lab work in: fasting lipid and liver panel at [facility]  If you have labs (blood work) drawn today and your tests are completely normal, you will receive your results only by: Marland Kitchen MyChart Message (if you have MyChart) OR . A paper copy in the mail If you have any lab test that is abnormal or we need to change your treatment, we will call you to review the results.  Testing/Procedures: Your physician has requested that you have an echocardiogram at [facility]. Echocardiography is a painless test that uses sound waves to create images of your heart. It provides your doctor with information about the size and shape of your heart and how well your heart's chambers and valves are working. This procedure takes approximately one hour. There are no restrictions for this procedure.  Follow-Up: At [facility], you and your health needs are our priority.  As part of our continuing mission to provide you with exceptional heart care, we have created designated Provider Care Teams.  These Care Teams include your primary Cardiologist (physician) and Advanced Practice Providers (APPs -  Physician Assistants and Nurse Practitioners) who all work together to provide you with the care you need, when you need it.  We recommend signing up for the patient portal called "MyChart".  Sign up information is provided on this After Visit Summary.  MyChart is used to connect with patients for Virtual Visits (Telemedicine).  Patients are able to view lab/test results, encounter notes, upcoming appointments, etc.  Non-urgent messages can be sent to your provider as well.   To learn  more about what you can do with MyChart, go to [email or web address].    Your next appointment:   6 month(s)  The format for your next appointment:   In Person  Provider:   You may see Dr. Eden Emms or one of the following Advanced Practice Providers on your designated Care Team:    [place], PA-C   Jacolyn Reedy, [place]

## 2020-02-27 ENCOUNTER — Other Ambulatory Visit: Payer: Self-pay

## 2020-02-27 ENCOUNTER — Other Ambulatory Visit: Payer: Medicare Other | Admitting: *Deleted

## 2020-02-27 DIAGNOSIS — Z8249 Family history of ischemic heart disease and other diseases of the circulatory system: Secondary | ICD-10-CM | POA: Diagnosis not present

## 2020-02-27 DIAGNOSIS — J3089 Other allergic rhinitis: Secondary | ICD-10-CM | POA: Diagnosis not present

## 2020-02-27 DIAGNOSIS — J3081 Allergic rhinitis due to animal (cat) (dog) hair and dander: Secondary | ICD-10-CM | POA: Diagnosis not present

## 2020-02-27 DIAGNOSIS — J301 Allergic rhinitis due to pollen: Secondary | ICD-10-CM | POA: Diagnosis not present

## 2020-02-27 DIAGNOSIS — I2510 Atherosclerotic heart disease of native coronary artery without angina pectoris: Secondary | ICD-10-CM

## 2020-02-27 DIAGNOSIS — R931 Abnormal findings on diagnostic imaging of heart and coronary circulation: Secondary | ICD-10-CM

## 2020-02-27 DIAGNOSIS — E782 Mixed hyperlipidemia: Secondary | ICD-10-CM | POA: Diagnosis not present

## 2020-02-27 LAB — HEPATIC FUNCTION PANEL
ALT: 8 IU/L (ref 0–32)
AST: 14 IU/L (ref 0–40)
Albumin: 4.1 g/dL (ref 3.8–4.8)
Alkaline Phosphatase: 125 IU/L — ABNORMAL HIGH (ref 44–121)
Bilirubin Total: <0.2 mg/dL (ref 0.0–1.2)
Bilirubin, Direct: <0.1 mg/dL (ref 0.00–0.40)
Total Protein: 6.2 g/dL (ref 6.0–8.5)

## 2020-03-01 ENCOUNTER — Other Ambulatory Visit: Payer: Self-pay

## 2020-03-01 ENCOUNTER — Ambulatory Visit (HOSPITAL_COMMUNITY)
Admission: RE | Admit: 2020-03-01 | Discharge: 2020-03-01 | Disposition: A | Discharge status: Home / Self Care | Payer: Medicare Other | Source: Ambulatory Visit | Attending: Cardiovascular Disease | Admitting: Cardiovascular Disease

## 2020-03-01 DIAGNOSIS — R931 Abnormal findings on diagnostic imaging of heart and coronary circulation: Secondary | ICD-10-CM | POA: Diagnosis not present

## 2020-03-01 DIAGNOSIS — I2510 Atherosclerotic heart disease of native coronary artery without angina pectoris: Secondary | ICD-10-CM

## 2020-03-01 DIAGNOSIS — Z8249 Family history of ischemic heart disease and other diseases of the circulatory system: Secondary | ICD-10-CM | POA: Diagnosis not present

## 2020-03-01 LAB — ECHOCARDIOGRAM COMPLETE
AR max vel: 2.44 cm2
AV Area VTI: 2.53 cm2
AV Area mean vel: 2.32 cm2
AV Mean grad: 4.4 mmHg
AV Peak grad: 8.4 mmHg
Ao pk vel: 1.45 m/s
Area-P 1/2: 3.1 cm2
S' Lateral: 2.86 cm

## 2020-03-01 NOTE — Progress Notes (Signed)
*  PRELIMINARY RESULTS* Echocardiogram 2D Echocardiogram has been performed.  Stacey Drain 03/01/2020, 10:18 AM

## 2020-03-02 DIAGNOSIS — J301 Allergic rhinitis due to pollen: Secondary | ICD-10-CM | POA: Diagnosis not present

## 2020-03-02 DIAGNOSIS — J3089 Other allergic rhinitis: Secondary | ICD-10-CM | POA: Diagnosis not present

## 2020-03-02 DIAGNOSIS — J3081 Allergic rhinitis due to animal (cat) (dog) hair and dander: Secondary | ICD-10-CM | POA: Diagnosis not present

## 2020-03-03 ENCOUNTER — Telehealth: Payer: Self-pay | Admitting: Cardiovascular Disease

## 2020-03-03 NOTE — Telephone Encounter (Signed)
New Message   Pt is calling and requesting for the nurse to call her to go over the echo and lab results   Please call

## 2020-03-03 NOTE — Telephone Encounter (Signed)
Called patient about lab and echo results. Waiting to see if lipid panel can be added to lab work done on 02/27/20. If not, patient will need to come in and have fasting lipid panel done. Patient verbalized understanding and had no other questions at this time.

## 2020-03-04 DIAGNOSIS — J301 Allergic rhinitis due to pollen: Secondary | ICD-10-CM | POA: Diagnosis not present

## 2020-03-04 DIAGNOSIS — J3081 Allergic rhinitis due to animal (cat) (dog) hair and dander: Secondary | ICD-10-CM | POA: Diagnosis not present

## 2020-03-04 DIAGNOSIS — J3089 Other allergic rhinitis: Secondary | ICD-10-CM | POA: Diagnosis not present

## 2020-03-05 ENCOUNTER — Telehealth: Payer: Self-pay

## 2020-03-05 DIAGNOSIS — E782 Mixed hyperlipidemia: Secondary | ICD-10-CM

## 2020-03-05 LAB — LIPID PANEL
Chol/HDL Ratio: 3.8 ratio (ref 0.0–4.4)
Cholesterol, Total: 192 mg/dL (ref 100–199)
HDL: 51 mg/dL (ref >39)
LDL Chol Calc (NIH): 111 mg/dL — ABNORMAL HIGH (ref 0–99)
Triglycerides: 173 mg/dL — ABNORMAL HIGH (ref 0–149)
VLDL Cholesterol Cal: 30 mg/dL (ref 5–40)

## 2020-03-05 LAB — SPECIMEN STATUS REPORT

## 2020-03-05 MED ORDER — ATORVASTATIN CALCIUM 10 MG PO TABS: 10.0000 mg | ORAL_TABLET | Freq: Every day | ORAL | 3 refills | Status: DC

## 2020-03-05 NOTE — Telephone Encounter (Signed)
I spoke with patient, she will start Atorvastatin 20 mg daily I will mail her lab slip.

## 2020-03-05 NOTE — Telephone Encounter (Signed)
-----   Message from Wendall Stade, MD sent at 03/05/2020  7:59 AM EDT ----- LDL and Triglycerides high with high calcium score start lipitor 20 mg daily and repeat labs in 3 months

## 2020-03-09 DIAGNOSIS — J3081 Allergic rhinitis due to animal (cat) (dog) hair and dander: Secondary | ICD-10-CM | POA: Diagnosis not present

## 2020-03-09 DIAGNOSIS — J301 Allergic rhinitis due to pollen: Secondary | ICD-10-CM | POA: Diagnosis not present

## 2020-03-09 DIAGNOSIS — J3089 Other allergic rhinitis: Secondary | ICD-10-CM | POA: Diagnosis not present

## 2020-03-11 DIAGNOSIS — J301 Allergic rhinitis due to pollen: Secondary | ICD-10-CM | POA: Diagnosis not present

## 2020-03-11 DIAGNOSIS — J3081 Allergic rhinitis due to animal (cat) (dog) hair and dander: Secondary | ICD-10-CM | POA: Diagnosis not present

## 2020-03-11 DIAGNOSIS — J3089 Other allergic rhinitis: Secondary | ICD-10-CM | POA: Diagnosis not present

## 2020-03-14 ENCOUNTER — Other Ambulatory Visit: Payer: Self-pay | Admitting: Neurology

## 2020-03-15 ENCOUNTER — Other Ambulatory Visit: Payer: Self-pay | Admitting: *Deleted

## 2020-03-15 ENCOUNTER — Encounter: Payer: Self-pay | Admitting: Family Medicine

## 2020-03-15 ENCOUNTER — Telehealth: Payer: Self-pay | Admitting: Neurology

## 2020-03-15 DIAGNOSIS — J301 Allergic rhinitis due to pollen: Secondary | ICD-10-CM | POA: Diagnosis not present

## 2020-03-15 DIAGNOSIS — J3089 Other allergic rhinitis: Secondary | ICD-10-CM | POA: Diagnosis not present

## 2020-03-15 DIAGNOSIS — J3081 Allergic rhinitis due to animal (cat) (dog) hair and dander: Secondary | ICD-10-CM | POA: Diagnosis not present

## 2020-03-15 DIAGNOSIS — M48061 Spinal stenosis, lumbar region without neurogenic claudication: Secondary | ICD-10-CM

## 2020-03-15 MED ORDER — PREGABALIN 100 MG PO CAPS
ORAL_CAPSULE | ORAL | 1 refills | Status: DC
Start: 2020-03-15 — End: 2020-09-29

## 2020-03-15 NOTE — Telephone Encounter (Signed)
Pt is asking for a call to discuss the reason as to why her pregabalin (LYRICA) 100 MG capsule was denied, the message was read to her, Pt is asking for a call from RN

## 2020-03-15 NOTE — Progress Notes (Signed)
Meds ordered this encounter  Medications   pregabalin (LYRICA) 100 MG capsule    Sig: TAKE 1 CAPSULE EVERY MORNING AND 2 CAPSULES IN THE EVENING    Dispense:  270 capsule    Refill:  1    Dr. Anne Hahn out of office. Dr. Terrace Arabia covering MD.

## 2020-03-17 NOTE — Telephone Encounter (Signed)
This Rx was sent successfully to [facility] on 03/15/20.

## 2020-03-17 NOTE — Telephone Encounter (Signed)
I called [facility], spoke with staff, and confirmed the pt filled the Lyrica yesterday, 10/19.

## 2020-03-22 DIAGNOSIS — J3081 Allergic rhinitis due to animal (cat) (dog) hair and dander: Secondary | ICD-10-CM | POA: Diagnosis not present

## 2020-03-22 DIAGNOSIS — J301 Allergic rhinitis due to pollen: Secondary | ICD-10-CM | POA: Diagnosis not present

## 2020-03-22 DIAGNOSIS — J3089 Other allergic rhinitis: Secondary | ICD-10-CM | POA: Diagnosis not present

## 2020-04-01 ENCOUNTER — Encounter: Payer: Self-pay | Admitting: Internal Medicine

## 2020-04-01 ENCOUNTER — Ambulatory Visit (INDEPENDENT_AMBULATORY_CARE_PROVIDER_SITE_OTHER): Payer: Medicare Other | Admitting: Internal Medicine

## 2020-04-01 VITALS — BP 120/72 | HR 67 | Ht 67.0 in | Wt 204.8 lb

## 2020-04-01 DIAGNOSIS — R748 Abnormal levels of other serum enzymes: Secondary | ICD-10-CM

## 2020-04-01 DIAGNOSIS — G8929 Other chronic pain: Secondary | ICD-10-CM | POA: Diagnosis not present

## 2020-04-01 DIAGNOSIS — Z9049 Acquired absence of other specified parts of digestive tract: Secondary | ICD-10-CM

## 2020-04-01 DIAGNOSIS — Z91018 Allergy to other foods: Secondary | ICD-10-CM | POA: Diagnosis not present

## 2020-04-01 DIAGNOSIS — K5909 Other constipation: Secondary | ICD-10-CM | POA: Diagnosis not present

## 2020-04-01 DIAGNOSIS — R1031 Right lower quadrant pain: Secondary | ICD-10-CM | POA: Insufficient documentation

## 2020-04-01 DIAGNOSIS — R931 Abnormal findings on diagnostic imaging of heart and coronary circulation: Secondary | ICD-10-CM | POA: Diagnosis not present

## 2020-04-01 DIAGNOSIS — J301 Allergic rhinitis due to pollen: Secondary | ICD-10-CM | POA: Diagnosis not present

## 2020-04-01 DIAGNOSIS — J3089 Other allergic rhinitis: Secondary | ICD-10-CM | POA: Diagnosis not present

## 2020-04-01 DIAGNOSIS — J3081 Allergic rhinitis due to animal (cat) (dog) hair and dander: Secondary | ICD-10-CM | POA: Diagnosis not present

## 2020-04-01 DIAGNOSIS — K5730 Diverticulosis of large intestine without perforation or abscess without bleeding: Secondary | ICD-10-CM

## 2020-04-01 DIAGNOSIS — Z9884 Bariatric surgery status: Secondary | ICD-10-CM

## 2020-04-01 DIAGNOSIS — R112 Nausea with vomiting, unspecified: Secondary | ICD-10-CM

## 2020-04-01 MED ORDER — BENEFIBER PO POWD: ORAL | 0 refills | Status: DC

## 2020-04-01 MED ORDER — POLYETHYLENE GLYCOL 3350 17 GM/SCOOP PO POWD: 17.0000 g | Freq: Every day | ORAL | Status: DC

## 2020-04-01 NOTE — Patient Instructions (Signed)
Normal BMI (Body Mass Index- based on height and weight) is between 23 and 30. Your BMI today is Body mass index is 32.08 kg/m. Marland Kitchen Please consider follow up  regarding your BMI with your Primary Care Provider.   Please try a tablespoon of Benefiber daily along with one dose of Miralax daily (17g). Both of these are over the counter.   Please call us late November to set up a January appointment.    I appreciate the opportunity to care for you. Stan Head, MD, [facility]

## 2020-04-01 NOTE — Progress Notes (Signed)
Patricia Eaton 69 y.o. [birth year] [id number]  Assessment & Plan:   Encounter Diagnoses  Name Primary?  . Chronic constipation Yes  . Abdominal pain, chronic, right lower quadrant   . H/O right hemicolectomy-for inflammatory mass unclear etiology 2003   . Diverticulosis of colon without hemorrhage   . Bariatric surgery status   . Allergy to alpha-gal   . Alkaline phosphatase elevation   . Nausea and vomiting, chronic and recurrent with negative imaging and endoscopic studies (multiple)    Regarding chronic constipation will try daily MiraLAX and daily Benefiber 1 dose and 1 tablespoon daily respectively Consider pelvic PT Do not think she needs a colonoscopy at this point last one in 2018 - except for diverticulosis which certainly may be playing some role  Inflammatory mass she had in 2003 does not seem to be linked to any chronic inflammatory conditions fortunately.  I suppose it could have been some sort of atypical Crohn's and she has had a longstanding remission though I doubt that.  I think it is most likely a nonissue at this time.  Interesting prior events, however.  I think looking for small intestinal bacterial overgrowth given the concomitant gas and flatulence issues.  Could prove useful but her bariatric surgery and right hemicolectomy could have impact on the interpretation of those so I will clarify those issues with the medical director of the lab we use and then we will contact her   Alkaline phosphatase elevation is mild and is waxed and waned over the years would simply observe at this point  Chronic recurrent nausea and vomiting with negative GI evaluations.  I suppose she could possibly have had some sort of vagal injury with her bypass surgery.  Interesting that she had a bezoar at one point.  Do not think there are other appropriate diagnostic options for therapeutic options at this time she seems to manage this.  She is to arrange follow-up for  January by calling later this month  I appreciate the opportunity to care for this patient. CC: [name], [name], MD     Subjective:   Chief Complaint: Multiple mostly chronic constipation and right lower quadrant abdominal pain as well as increased gas symptoms  HPI 69 year old married white woman referred by Patricia Eaton to establish GI care, has a history of gastric bypass and also a history of right hemicolectomy in 2003 for some cecal mass that was inflammatory but not cancer.  Concerns today involve right lower quadrant pain and some change in defecation with constipation and small balls of stool.  Right lower quadrant pain is described as something like being punched in the groin.  It happens about 5 days a month.  Not related to movement defecation no clear triggers.  She saw Patricia Eaton of urology because she has a history of urinary tract infection issues she got treated for 1 there were inconclusive results and at any rate that did not help her right lower quadrant pain though it did help pain related to UTI.  Stools are small balls of stool and she moves her bowels a few days a week.  No bleeding reported.  She does have a history of diverticulosis last colonoscopy showed that in 2018 in [place].  She reports she had a colonoscopy every year for 5 years after the right hemicolectomy for this inflammatory mass.  She was recommended to repeat 1 5 years from her 2018 colonoscopy.  It is uncomfortable and bloated in between defecation.  She  has tried Metamucil for months at a time and MiraLAX for at least many weeks at a time but not together.  No prescription medications to try to help with defecation.  Also very gassy and bloated.  Does not have urinary incontinence issues has also tried probiotic without help  She has some chronic intermittent nausea and vomiting if she "does not eat properly" Was diagnosed with alpha gal has a positive alpha gal IgE test.  Any beef pork or other food  meats will make her vomit though I do not think she has respiratory issues.  She is seeing Patricia Eaton of allergy.  She has a bunch of other seasonal allergies as well.  It also seen Patricia Eaton of asthma and allergy in notes from June of this year show that she does have hives vomiting and diarrhea with mammalian meat and fevers up to 104 at times.  Serum immunoglobulins normal she does have a positive IgE to alpha gal but negative IgE testing to beef pork Lamb  Heartburn and indigestion generally controlled.  EGD 2019 Dr. Jena Eaton no abnormalities post gastric bypass images reviewed  CT scan 2020 abdomen pelvis with contrast negative images viewed  Upper GI October 2020 small hiatal hernia and mild tertiary contractions in the esophagus otherwise normal status post gastric bypass surgery.  Images reviewed Wt Readings from Last 3 Encounters:  04/01/20 204 lb 12.8 oz (92.9 kg)  02/24/20 205 lb (93 kg)  01/13/20 202 lb 6.4 oz (91.8 kg)   Gastroenterology records review from [facility] [place] hemoglobin 8.4 normal MCV 2016 normal hemoglobin 2014 alk phos 150 in 2014 which was slightly elevated normal in 2015 - factor V Leiden  2003 pathology report from right hemicolectomy.  Atypical lymphoid proliferation involving terminal ileum ileocecal valve colon and appendix.  Process more intense and terminal ileum and ileocecal valve with mucosal ulcerations thickening in the wall with infiltration of lymphoid and inflammatory cells and muscularis propria and subserosa with evidence of vasculitis.  Outside review thought that it could be a very florid immune response to viruses such as CMV though that was not not detected adenovirus or other infections.  Generalized vasculitic disease or unusual form of Crohn's disease also possible.  No clonal population was found on a molecular analysis.  EGD in 2015 with esophageal and gastric biopsies 2017 mild gastritis no H. pylori 2014 EGD normal gastric pouch and  esophageal biopsies  Gastric bezoar 2017 2015 EGD for recurrent vomiting -2015 CT abdomen pelvis diverticulosis 2018 colonoscopy sigmoid diverticulosis and right hemicolectomy evidence  Lab Results  Component Value Date   ALT 8 02/27/2020   AST 14 02/27/2020   ALKPHOS 125 (H) 02/27/2020   BILITOT <0.2 02/27/2020     Allergies  Allergen Reactions  . Molds & Smuts Cough    And grass; cause sneezing and watery eyes, too  . Other Rash    Oak causes rash   Current Meds  Medication Sig  . Ascorbic Acid (VITAMIN C WITH ROSE HIPS) 1000 MG tablet Take 1,000 mg by mouth daily.  Marland Kitchen aspirin EC 81 MG tablet Take 81 mg by mouth daily.  Marland Kitchen atorvastatin (LIPITOR) 10 MG tablet Take 1 tablet (10 mg total) by mouth daily.  Marland Kitchen CALCIUM PO Take 500 mg by mouth daily.   . cholecalciferol (VITAMIN D) 1000 units tablet Take 5,000 Units by mouth daily.   Marland Kitchen CRANBERRY PO Take 1 tablet by mouth daily.  . ferrous sulfate 325 (65 FE) MG tablet  Take 325 mg by mouth daily with breakfast.  . fluticasone (FLONASE) 50 MCG/ACT nasal spray Place 2 sprays into both nostrils daily.  . hydrochlorothiazide (MICROZIDE) 12.5 MG capsule Take 1 capsule (12.5 mg total) by mouth daily as needed (for edema).  Marland Kitchen MAGNESIUM PO Take 1 tablet by mouth daily.  . melatonin 3 MG TABS tablet Take 6 mg by mouth at bedtime.  Marland Kitchen omeprazole (PRILOSEC) 40 MG capsule Take 40 mg by mouth daily.  . pregabalin (LYRICA) 100 MG capsule TAKE 1 CAPSULE EVERY MORNING AND 2 CAPSULES IN THE EVENING  . PRESCRIPTION MEDICATION Allergy shot three times weekly  . Probiotic Product (PROBIOTIC DAILY PO) Take 1 tablet by mouth daily.  . Quercetin 250 MG TABS Take 1 tablet by mouth daily.   Marland Kitchen tiZANidine (ZANAFLEX) 4 MG tablet Take 1 tablet by mouth daily as needed.  . trimethoprim (TRIMPEX) 100 MG tablet Take 100 mg by mouth daily.  Marland Kitchen zinc gluconate 50 MG tablet Take 50 mg by mouth daily.   Past Medical History:  Diagnosis Date  . Allergy to alpha-gal   .  Angio-edema   . Anxiety   . Asthma    childhood asthma  . Colonic mass    In [place], found inconclusive  . Eczema   . Environmental allergies    trees, grass, oak trees  . GERD (gastroesophageal reflux disease)   . Leg cramps 12/15/2019  . Lumbar spinal stenosis 12/15/2019   L2-3 level  . Neuropathy   . Numbness   . Pulmonary embolism ([place])   . Seasonal allergies    Past Surgical History:  Procedure Laterality Date  . APPENDECTOMY    . COLONOSCOPY  2018   [place]: diverticulosis  . ESOPHAGOGASTRODUODENOSCOPY N/A 01/09/2018   normal esophagus, normal residual stomach status post gastric obesity procedure.  Marland Kitchen GASTRIC BYPASS  2003  . LUMBAR FUSION  2016  . PARTIAL HYSTERECTOMY  1980  . TONSILLECTOMY    . WRIST FRACTURE SURGERY  2013   Social History   Social History Narrative   Lives at home with husband.  Married multiple times.  3 grown sons also grandchildren and step grandchildren.   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    family history includes Cancer in her mother; Colon cancer in her maternal aunt; Crohn's disease in her grandchild and son; Heart attack in her father; Heart failure in her father; Pancreatic cancer in her sister; Stroke in her maternal grandmother.   Review of Systems As per HPI also positive for fatigue and some decreased vision  Objective:   Physical Exam BP 120/72   Pulse 67   Ht $R'5\' 7"'QK$  (1.702 m)   Wt 204 lb 12.8 oz (92.9 kg)   SpO2 98%   BMI 32.08 kg/m  NAD well-developed well-nourished obese Eyes are anicteric Patti [place], [place] present.   Abdomen is soft result Pfannenstiel scar perhaps slightly tender to deep palpation in the right lower quadrant groin is nontender without lymphadenopathy.  Bowel sounds are present.  No organomegaly or mass or hernia detected.  Manipulation of the right hip  with flexion extension internal and external rotation does not cause any pain.  Rectal exam shows no skin lesions in the anoderm.  Anal wink is negative/absent resting and voluntary squeeze are somewhat decreased and I think there  is a small rectocele.  There may be some increased descent with simulated defecation with appropriate abdominal contraction.  There is no mass.  There is small amount of formed stool present.   Data reviewed as per HPI

## 2020-04-09 DIAGNOSIS — J3089 Other allergic rhinitis: Secondary | ICD-10-CM | POA: Diagnosis not present

## 2020-04-09 DIAGNOSIS — J301 Allergic rhinitis due to pollen: Secondary | ICD-10-CM | POA: Diagnosis not present

## 2020-04-09 DIAGNOSIS — J3081 Allergic rhinitis due to animal (cat) (dog) hair and dander: Secondary | ICD-10-CM | POA: Diagnosis not present

## 2020-04-12 DIAGNOSIS — H0288A Meibomian gland dysfunction right eye, upper and lower eyelids: Secondary | ICD-10-CM | POA: Diagnosis not present

## 2020-04-12 DIAGNOSIS — H0288B Meibomian gland dysfunction left eye, upper and lower eyelids: Secondary | ICD-10-CM | POA: Diagnosis not present

## 2020-04-12 DIAGNOSIS — H2513 Age-related nuclear cataract, bilateral: Secondary | ICD-10-CM | POA: Diagnosis not present

## 2020-04-19 DIAGNOSIS — J3081 Allergic rhinitis due to animal (cat) (dog) hair and dander: Secondary | ICD-10-CM | POA: Diagnosis not present

## 2020-04-19 DIAGNOSIS — J3089 Other allergic rhinitis: Secondary | ICD-10-CM | POA: Diagnosis not present

## 2020-04-19 DIAGNOSIS — J301 Allergic rhinitis due to pollen: Secondary | ICD-10-CM | POA: Diagnosis not present

## 2020-04-25 ENCOUNTER — Encounter: Payer: Self-pay | Admitting: Family Medicine

## 2020-04-25 DIAGNOSIS — M2550 Pain in unspecified joint: Secondary | ICD-10-CM

## 2020-04-25 DIAGNOSIS — R42 Dizziness and giddiness: Secondary | ICD-10-CM

## 2020-04-25 DIAGNOSIS — R5382 Chronic fatigue, unspecified: Secondary | ICD-10-CM

## 2020-04-26 NOTE — Addendum Note (Signed)
Addended by: Lillia Carmel on: 04/26/2020 09:58 AM   Modules accepted: Orders

## 2020-04-29 DIAGNOSIS — J3081 Allergic rhinitis due to animal (cat) (dog) hair and dander: Secondary | ICD-10-CM | POA: Diagnosis not present

## 2020-04-29 DIAGNOSIS — J301 Allergic rhinitis due to pollen: Secondary | ICD-10-CM | POA: Diagnosis not present

## 2020-04-29 DIAGNOSIS — J3089 Other allergic rhinitis: Secondary | ICD-10-CM | POA: Diagnosis not present

## 2020-04-30 ENCOUNTER — Telehealth: Payer: Self-pay

## 2020-04-30 NOTE — Addendum Note (Signed)
Addended by: Lillia Carmel on: 04/30/2020 10:33 AM   Modules accepted: Orders

## 2020-04-30 NOTE — Telephone Encounter (Signed)
Patient called she stated she is trying to schedule blood work for Monday if possible she would like a call back. CB:[phone]

## 2020-04-30 NOTE — Telephone Encounter (Signed)
I called - coming 05/03/20 at 9:00 for blood work.

## 2020-05-03 ENCOUNTER — Other Ambulatory Visit: Payer: Self-pay

## 2020-05-03 ENCOUNTER — Ambulatory Visit (INDEPENDENT_AMBULATORY_CARE_PROVIDER_SITE_OTHER): Payer: Medicare Other

## 2020-05-03 DIAGNOSIS — R5382 Chronic fatigue, unspecified: Secondary | ICD-10-CM | POA: Diagnosis not present

## 2020-05-03 DIAGNOSIS — R42 Dizziness and giddiness: Secondary | ICD-10-CM

## 2020-05-03 DIAGNOSIS — M2550 Pain in unspecified joint: Secondary | ICD-10-CM

## 2020-05-03 NOTE — Progress Notes (Signed)
Lab visit only: B. burgdorfi antibodies by WB.

## 2020-05-05 ENCOUNTER — Telehealth: Payer: Self-pay | Admitting: Family Medicine

## 2020-05-05 NOTE — Telephone Encounter (Signed)
Patient called requesting a call back about blood work results. Please call patient at [phone].

## 2020-05-05 NOTE — Telephone Encounter (Signed)
I called the patient. We have received the results back yet - I checked the provider's portal also. It might take around a week before we get these in. She said that she keeps getting messages through MyChart about test results, so that is why she called. Not sure what that is about, but I did assure her that Dr. Prince Rome will send her a message when the results are indeed available.

## 2020-05-06 DIAGNOSIS — J3081 Allergic rhinitis due to animal (cat) (dog) hair and dander: Secondary | ICD-10-CM | POA: Diagnosis not present

## 2020-05-06 DIAGNOSIS — J301 Allergic rhinitis due to pollen: Secondary | ICD-10-CM | POA: Diagnosis not present

## 2020-05-06 DIAGNOSIS — J3089 Other allergic rhinitis: Secondary | ICD-10-CM | POA: Diagnosis not present

## 2020-05-06 LAB — B. BURGDORFI ANTIBODIES BY WB
B burgdorferi IgG Abs (IB): POSITIVE — AB
B burgdorferi IgM Abs (IB): NEGATIVE
Lyme Disease 18 kD IgG: NONREACTIVE
Lyme Disease 23 kD IgG: REACTIVE — AB
Lyme Disease 23 kD IgM: REACTIVE — AB
Lyme Disease 28 kD IgG: NONREACTIVE
Lyme Disease 30 kD IgG: NONREACTIVE
Lyme Disease 39 kD IgG: REACTIVE — AB
Lyme Disease 39 kD IgM: NONREACTIVE
Lyme Disease 41 kD IgG: REACTIVE — AB
Lyme Disease 41 kD IgM: NONREACTIVE
Lyme Disease 45 kD IgG: NONREACTIVE
Lyme Disease 58 kD IgG: REACTIVE — AB
Lyme Disease 66 kD IgG: NONREACTIVE
Lyme Disease 93 kD IgG: REACTIVE — AB

## 2020-05-07 ENCOUNTER — Telehealth: Payer: Self-pay | Admitting: Family Medicine

## 2020-05-07 DIAGNOSIS — A6920 Lyme disease, unspecified: Secondary | ICD-10-CM | POA: Insufficient documentation

## 2020-05-07 MED ORDER — DOXYCYCLINE HYCLATE 100 MG PO CAPS: ORAL_CAPSULE | ORAL | 3 refills | Status: DC

## 2020-05-07 NOTE — Telephone Encounter (Signed)
Multiple Lyme antibodies were positive.

## 2020-05-17 DIAGNOSIS — J301 Allergic rhinitis due to pollen: Secondary | ICD-10-CM | POA: Diagnosis not present

## 2020-05-17 DIAGNOSIS — J3089 Other allergic rhinitis: Secondary | ICD-10-CM | POA: Diagnosis not present

## 2020-05-17 DIAGNOSIS — J3081 Allergic rhinitis due to animal (cat) (dog) hair and dander: Secondary | ICD-10-CM | POA: Diagnosis not present

## 2020-05-25 DIAGNOSIS — J3089 Other allergic rhinitis: Secondary | ICD-10-CM | POA: Diagnosis not present

## 2020-05-25 DIAGNOSIS — J3081 Allergic rhinitis due to animal (cat) (dog) hair and dander: Secondary | ICD-10-CM | POA: Diagnosis not present

## 2020-05-25 DIAGNOSIS — J301 Allergic rhinitis due to pollen: Secondary | ICD-10-CM | POA: Diagnosis not present

## 2020-05-26 ENCOUNTER — Other Ambulatory Visit: Payer: Self-pay | Admitting: Cardiovascular Disease

## 2020-05-26 DIAGNOSIS — J3089 Other allergic rhinitis: Secondary | ICD-10-CM | POA: Diagnosis not present

## 2020-05-26 DIAGNOSIS — J301 Allergic rhinitis due to pollen: Secondary | ICD-10-CM | POA: Diagnosis not present

## 2020-05-26 DIAGNOSIS — J3081 Allergic rhinitis due to animal (cat) (dog) hair and dander: Secondary | ICD-10-CM | POA: Diagnosis not present

## 2020-05-26 DIAGNOSIS — E782 Mixed hyperlipidemia: Secondary | ICD-10-CM | POA: Diagnosis not present

## 2020-05-26 NOTE — Addendum Note (Signed)
Addended by: Lillia Carmel on: 05/26/2020 07:22 AM   Modules accepted: Orders

## 2020-05-27 LAB — HEPATIC FUNCTION PANEL
ALT: 11 IU/L (ref 0–32)
AST: 13 IU/L (ref 0–40)
Albumin: 4.1 g/dL (ref 3.8–4.8)
Alkaline Phosphatase: 127 IU/L — ABNORMAL HIGH (ref 44–121)
Bilirubin Total: 0.3 mg/dL (ref 0.0–1.2)
Bilirubin, Direct: <0.1 mg/dL (ref 0.00–0.40)
Total Protein: 6.3 g/dL (ref 6.0–8.5)

## 2020-05-27 LAB — LIPID PANEL W/O CHOL/HDL RATIO
Cholesterol, Total: 134 mg/dL (ref 100–199)
HDL: 63 mg/dL (ref >39)
LDL Chol Calc (NIH): 52 mg/dL (ref 0–99)
Triglycerides: 102 mg/dL (ref 0–149)
VLDL Cholesterol Cal: 19 mg/dL (ref 5–40)

## 2020-06-04 DIAGNOSIS — J3081 Allergic rhinitis due to animal (cat) (dog) hair and dander: Secondary | ICD-10-CM | POA: Diagnosis not present

## 2020-06-04 DIAGNOSIS — J3089 Other allergic rhinitis: Secondary | ICD-10-CM | POA: Diagnosis not present

## 2020-06-04 DIAGNOSIS — J301 Allergic rhinitis due to pollen: Secondary | ICD-10-CM | POA: Diagnosis not present

## 2020-06-07 DIAGNOSIS — J3081 Allergic rhinitis due to animal (cat) (dog) hair and dander: Secondary | ICD-10-CM | POA: Diagnosis not present

## 2020-06-07 DIAGNOSIS — J301 Allergic rhinitis due to pollen: Secondary | ICD-10-CM | POA: Diagnosis not present

## 2020-06-07 DIAGNOSIS — J3089 Other allergic rhinitis: Secondary | ICD-10-CM | POA: Diagnosis not present

## 2020-06-10 ENCOUNTER — Telehealth: Payer: Self-pay | Admitting: Neurology

## 2020-06-10 NOTE — Telephone Encounter (Deleted)
..   Pt understands that although there may be some limitations with this type of visit, we will take all precautions to reduce any security or privacy concerns.  Pt understands that this will be treated like an in office visit and we will file with pt's insurance, and there may be a patient responsible charge related to this service. ? ?

## 2020-06-11 DIAGNOSIS — H25811 Combined forms of age-related cataract, right eye: Secondary | ICD-10-CM | POA: Diagnosis not present

## 2020-06-16 ENCOUNTER — Telehealth: Payer: Medicare Other | Admitting: Neurology

## 2020-06-16 DIAGNOSIS — J301 Allergic rhinitis due to pollen: Secondary | ICD-10-CM | POA: Diagnosis not present

## 2020-06-16 DIAGNOSIS — J3089 Other allergic rhinitis: Secondary | ICD-10-CM | POA: Diagnosis not present

## 2020-06-16 DIAGNOSIS — J3081 Allergic rhinitis due to animal (cat) (dog) hair and dander: Secondary | ICD-10-CM | POA: Diagnosis not present

## 2020-06-21 DIAGNOSIS — J3081 Allergic rhinitis due to animal (cat) (dog) hair and dander: Secondary | ICD-10-CM | POA: Diagnosis not present

## 2020-06-21 DIAGNOSIS — J3089 Other allergic rhinitis: Secondary | ICD-10-CM | POA: Diagnosis not present

## 2020-06-21 DIAGNOSIS — J301 Allergic rhinitis due to pollen: Secondary | ICD-10-CM | POA: Diagnosis not present

## 2020-06-25 DIAGNOSIS — H25812 Combined forms of age-related cataract, left eye: Secondary | ICD-10-CM | POA: Diagnosis not present

## 2020-07-01 DIAGNOSIS — J301 Allergic rhinitis due to pollen: Secondary | ICD-10-CM | POA: Diagnosis not present

## 2020-07-01 DIAGNOSIS — J3081 Allergic rhinitis due to animal (cat) (dog) hair and dander: Secondary | ICD-10-CM | POA: Diagnosis not present

## 2020-07-01 DIAGNOSIS — J3089 Other allergic rhinitis: Secondary | ICD-10-CM | POA: Diagnosis not present

## 2020-07-06 DIAGNOSIS — J3081 Allergic rhinitis due to animal (cat) (dog) hair and dander: Secondary | ICD-10-CM | POA: Diagnosis not present

## 2020-07-06 DIAGNOSIS — J3089 Other allergic rhinitis: Secondary | ICD-10-CM | POA: Diagnosis not present

## 2020-07-06 DIAGNOSIS — J301 Allergic rhinitis due to pollen: Secondary | ICD-10-CM | POA: Diagnosis not present

## 2020-07-08 NOTE — Progress Notes (Signed)
CARDIOLOGY CONSULT NOTE       Patient ID: Patricia Eaton MRN: [id number] DOB/AGE: [birth year] 70 y.o.  Admit date: (Not on file) Referring Physician: [name] Primary Physician: Lavada Mesi, MD Primary Cardiologist: New Reason for Consultation: Family history of CAD  Active Problems:   * No active hospital problems. *   HPI:  70 y.o. referred by Dr Prince Rome for family history of CAD. She established care with him on 01/11/20. She has distant history of tick bite Rx doxycycline lyme testing equivocal. 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 Monitor 2019 no significant arrhythmiias. Rx with PRN atenolol Seemed to be stress induced She has GERD post gastric bypass. Also distant history of PE in 2016    Former smoker quit in 1999  TTE 03/01/20 normal EF 60-65% no significant valve disease   Cardiac CTA 10/15/17 with calcium score 62 which was 74 th percentile no obstructive CAD isolated to LAD left dominant system  Some cervical /lumbar spine issues previous L4S1 fusion Last injection cervical spine done 12/23/19   Started on lipitor 20 mg 03/05/20 for LDL 111 with repeat much improved at 53 now  She and her husband ride motorcycles a lot  ROS All other systems reviewed and negative except as noted above  Past Medical History:  Diagnosis Date  . Allergy to alpha-gal   . Angio-edema   . Anxiety   . Asthma    childhood asthma  . Colonic mass    In [place], found inconclusive  . Eczema   . Environmental allergies    trees, grass, oak trees  . GERD (gastroesophageal reflux disease)   . Leg cramps 12/15/2019  . Lumbar spinal stenosis 12/15/2019   L2-3 level  . Neuropathy   . Numbness   . Pulmonary embolism (HCC)   . Seasonal allergies     Family History  Problem Relation Age of Onset  . Colon cancer Maternal Aunt   . Crohn's disease Grandchild   .  Crohn's disease Son   . Cancer Mother        kidney or liver  . Heart failure Father   . Heart attack Father   . Pancreatic cancer Sister   . Stroke Maternal Grandmother   . Allergic rhinitis Neg Hx   . Asthma Neg Hx   . Eczema Neg Hx   . Urticaria Neg Hx   . Esophageal cancer Neg Hx   . Stomach cancer Neg Hx     Social History   Socioeconomic History  . Marital status: Married    Spouse name: Not on file  . Number of children: 3  . Years of education: 13  . Highest education level: High school graduate  Occupational History  . Occupation: Retired from trucking  Tobacco Use  . Smoking status: Former Smoker    Types: Cigarettes    Quit date: 05/29/1997    Years since quitting: 23.1  . Smokeless tobacco: Never Used  Vaping Use  . Vaping Use: Never used  Substance and Sexual Activity  . Alcohol use: Yes    Comment: occasional  . Drug use: Never  . Sexual activity: Not on file  Other Topics Concern  . Not on file  Social History Narrative   Lives at home with husband.  Married multiple times.  3 grown sons also grandchildren and step grandchildren.   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    Social Determinants of Health   Financial Resource Strain: Not on file  Food Insecurity: Not on file  Transportation Needs: Not on file  Physical Activity: Not on file  Stress: Not on file  Social Connections: Not on file  Intimate Partner Violence: Not on file    Past Surgical History:  Procedure Laterality Date  . APPENDECTOMY    . COLONOSCOPY  2018   [place]: diverticulosis  . ESOPHAGOGASTRODUODENOSCOPY N/A 01/09/2018   normal esophagus, normal residual stomach status post gastric obesity procedure.  [facility] GASTRIC BYPASS  2003  . LUMBAR FUSION  2016  . PARTIAL HYSTERECTOMY  1980  . TONSILLECTOMY    . WRIST FRACTURE SURGERY   2013      Current Outpatient Medications:  .  Ascorbic Acid (VITAMIN C WITH ROSE HIPS) 1000 MG tablet, Take 1,000 mg by mouth daily., Disp: , Rfl:  .  aspirin EC 81 MG tablet, Take 81 mg by mouth daily., Disp: , Rfl:  .  atorvastatin (LIPITOR) 10 MG tablet, Take 1 tablet (10 mg total) by mouth daily., Disp: 90 tablet, Rfl: 3 .  CALCIUM PO, Take 500 mg by mouth daily. , Disp: , Rfl:  .  cholecalciferol (VITAMIN D) 1000 units tablet, Take 5,000 Units by mouth daily. , Disp: , Rfl:  .  CRANBERRY PO, Take 1 tablet by mouth daily., Disp: , Rfl:  .  doxycycline (VIBRAMYCIN) 100 MG capsule, 1 PO BID x 6 weeks, then 2 weeks off, then repeat, Disp: 84 capsule, Rfl: 3 .  ferrous sulfate 325 (65 FE) MG tablet, Take 325 mg by mouth daily with breakfast., Disp: , Rfl:  .  fluticasone (FLONASE) 50 MCG/ACT nasal spray, Place 2 sprays into both nostrils daily., Disp: , Rfl:  .  hydrochlorothiazide (MICROZIDE) 12.5 MG capsule, Take 1 capsule (12.5 mg total) by mouth daily as needed (for edema)., Disp: 90 capsule, Rfl: 3 .  MAGNESIUM PO, Take 1 tablet by mouth daily., Disp: , Rfl:  .  melatonin 3 MG TABS tablet, Take 6 mg by mouth at bedtime., Disp: , Rfl:  .  omeprazole (PRILOSEC) 40 MG capsule, Take 40 mg by mouth daily., Disp: , Rfl:  .  polyethylene glycol powder (GLYCOLAX/MIRALAX) 17 GM/SCOOP powder, Take 17 g by mouth daily., Disp: , Rfl:  .  pregabalin (LYRICA) 100 MG capsule, TAKE 1 CAPSULE EVERY MORNING AND 2 CAPSULES IN THE EVENING, Disp: 270 capsule, Rfl: 1 .  PRESCRIPTION MEDICATION, Allergy shot three times weekly, Disp: , Rfl:  .  Probiotic Product (PROBIOTIC DAILY PO), Take 1 tablet by mouth daily., Disp: , Rfl:  .  Quercetin 250 MG TABS, Take 1 tablet by mouth daily. , Disp: , Rfl:  .  tiZANidine (ZANAFLEX) 4 MG tablet, Take 1 tablet by mouth daily as needed., Disp: , Rfl:  .  trimethoprim (TRIMPEX) 100 MG tablet, Take 100 mg by mouth daily., Disp: , Rfl:  .  Wheat Dextrin (BENEFIBER) POWD, 1  tablespoon daily, Disp: , Rfl: 0 .  zinc gluconate 50 MG tablet, Take 50 mg by mouth daily., Disp: , Rfl:     Physical Exam: Blood pressure 134/70, pulse 76, height 5\' 7"  (1.702 m), weight 93.9 kg, SpO2 95 %.   Affect appropriate Healthy:  appears stated age  HEENT: normal Neck supple with no adenopathy JVP normal no bruits no thyromegaly Lungs clear with no wheezing and good diaphragmatic motion Heart:  S1/S2 no murmur, no rub, gallop or click PMI normal Abdomen: benighn, BS positve, no tenderness, no AAA no bruit.  No HSM or HJR Distal pulses intact with no bruits No edema Neuro non-focal Skin warm and dry No muscular weakness   Labs:   Lab Results  Component Value Date   WBC 12.2 (H) 10/29/2019   HGB 14.2 10/29/2019   HCT 42.1 10/29/2019   MCV 91 10/29/2019   No results for input(s): NA, K, CL, CO2, BUN, CREATININE, CALCIUM, PROT, BILITOT, ALKPHOS, ALT, AST, GLUCOSE in the last 168 hours.  Invalid input(s): LABALBU No results found for: CKTOTAL, CKMB, CKMBINDEX, TROPONINI  Lab Results  Component Value Date   CHOL 134 05/26/2020   CHOL 192 02/27/2020   Lab Results  Component Value Date   HDL 63 05/26/2020   HDL 51 02/27/2020   Lab Results  Component Value Date   LDLCALC 52 05/26/2020   LDLCALC 111 (H) 02/27/2020   Lab Results  Component Value Date   TRIG 102 05/26/2020   TRIG 173 (H) 02/27/2020   Lab Results  Component Value Date   CHOLHDL 3.8 02/27/2020   No results found for: LDLDIRECT    Radiology: No results found.  EKG: 07/25/18 NSR rate 66 normal  02/24/20 SR rate 70 nonspecific ST changes    ASSESSMENT AND PLAN:   1. CAD: non obstructive left dominant system with calcium score 62 isolated to LAD on CT 10/15/17  On ASA 81 mg Started on lipitor and LDL at target now 05/26/20 52   2. GERD: post gastric bypass on Prilosec   3. Ortho:  Lumbar and cervical spine issues post fusion and injection Rx f/u IR radiology and neuro continue  Lyrica  4. Fatigue:  Functional rhythm and vitals fine exam fine EF 60-65% by TTE 03/01/20 no significant valve dx   F/U [place] in a year   Signed: Charlton Haws 07/19/2020, 9:03 AM

## 2020-07-13 DIAGNOSIS — J301 Allergic rhinitis due to pollen: Secondary | ICD-10-CM | POA: Diagnosis not present

## 2020-07-13 DIAGNOSIS — J3089 Other allergic rhinitis: Secondary | ICD-10-CM | POA: Diagnosis not present

## 2020-07-13 DIAGNOSIS — J3081 Allergic rhinitis due to animal (cat) (dog) hair and dander: Secondary | ICD-10-CM | POA: Diagnosis not present

## 2020-07-19 ENCOUNTER — Encounter: Payer: Self-pay | Admitting: Cardiovascular Disease

## 2020-07-19 ENCOUNTER — Other Ambulatory Visit: Payer: Self-pay

## 2020-07-19 ENCOUNTER — Ambulatory Visit (INDEPENDENT_AMBULATORY_CARE_PROVIDER_SITE_OTHER): Payer: Medicare Other | Admitting: Cardiovascular Disease

## 2020-07-19 VITALS — BP 134/70 | HR 76 | Ht 67.0 in | Wt 207.0 lb

## 2020-07-19 DIAGNOSIS — I2510 Atherosclerotic heart disease of native coronary artery without angina pectoris: Secondary | ICD-10-CM | POA: Diagnosis not present

## 2020-07-19 DIAGNOSIS — E782 Mixed hyperlipidemia: Secondary | ICD-10-CM | POA: Diagnosis not present

## 2020-07-19 NOTE — Patient Instructions (Signed)
Medication Instructions:  Your physician recommends that you continue on your current medications as directed. Please refer to the Current Medication list given to you today.  *If you need a refill on your cardiac medications before your next appointment, please call your pharmacy*   Lab Work: NONE   If you have labs (blood work) drawn today and your tests are completely normal, you will receive your results only by: . MyChart Message (if you have MyChart) OR . A paper copy in the mail If you have any lab test that is abnormal or we need to change your treatment, we will call you to review the results.   Testing/Procedures: NONE    Follow-Up: At [facility], you and your health needs are our priority.  As part of our continuing mission to provide you with exceptional heart care, we have created designated Provider Care Teams.  These Care Teams include your primary Cardiologist (physician) and Advanced Practice Providers (APPs -  Physician Assistants and Nurse Practitioners) who all work together to provide you with the care you need, when you need it.  We recommend signing up for the patient portal called "MyChart".  Sign up information is provided on this After Visit Summary.  MyChart is used to connect with patients for Virtual Visits (Telemedicine).  Patients are able to view lab/test results, encounter notes, upcoming appointments, etc.  Non-urgent messages can be sent to your provider as well.   To learn more about what you can do with MyChart, go to [email or web address].    Your next appointment:   1 year(s)  The format for your next appointment:   In Person  Provider:   Peter Nishan, MD   Other Instructions Thank you for choosing [facility]!    

## 2020-07-21 DIAGNOSIS — J3081 Allergic rhinitis due to animal (cat) (dog) hair and dander: Secondary | ICD-10-CM | POA: Diagnosis not present

## 2020-07-21 DIAGNOSIS — Z91018 Allergy to other foods: Secondary | ICD-10-CM | POA: Diagnosis not present

## 2020-07-21 DIAGNOSIS — J3089 Other allergic rhinitis: Secondary | ICD-10-CM | POA: Diagnosis not present

## 2020-07-21 DIAGNOSIS — J301 Allergic rhinitis due to pollen: Secondary | ICD-10-CM | POA: Diagnosis not present

## 2020-07-27 DIAGNOSIS — J3089 Other allergic rhinitis: Secondary | ICD-10-CM | POA: Diagnosis not present

## 2020-07-27 DIAGNOSIS — J3081 Allergic rhinitis due to animal (cat) (dog) hair and dander: Secondary | ICD-10-CM | POA: Diagnosis not present

## 2020-07-27 DIAGNOSIS — R350 Frequency of micturition: Secondary | ICD-10-CM | POA: Diagnosis not present

## 2020-07-27 DIAGNOSIS — J301 Allergic rhinitis due to pollen: Secondary | ICD-10-CM | POA: Diagnosis not present

## 2020-08-05 DIAGNOSIS — J3089 Other allergic rhinitis: Secondary | ICD-10-CM | POA: Diagnosis not present

## 2020-08-05 DIAGNOSIS — J3081 Allergic rhinitis due to animal (cat) (dog) hair and dander: Secondary | ICD-10-CM | POA: Diagnosis not present

## 2020-08-05 DIAGNOSIS — J301 Allergic rhinitis due to pollen: Secondary | ICD-10-CM | POA: Diagnosis not present

## 2020-08-06 ENCOUNTER — Encounter: Payer: Self-pay | Admitting: Family Medicine

## 2020-08-09 DIAGNOSIS — J3089 Other allergic rhinitis: Secondary | ICD-10-CM | POA: Diagnosis not present

## 2020-08-09 DIAGNOSIS — J301 Allergic rhinitis due to pollen: Secondary | ICD-10-CM | POA: Diagnosis not present

## 2020-08-09 DIAGNOSIS — J3081 Allergic rhinitis due to animal (cat) (dog) hair and dander: Secondary | ICD-10-CM | POA: Diagnosis not present

## 2020-08-11 ENCOUNTER — Telehealth: Payer: Self-pay

## 2020-08-11 NOTE — Telephone Encounter (Signed)
Patient would like to know if she could be worked into the schedule and if not, could she be sent to [facility] for an x-ray?  Stated that she is having severe left shoulder pain.  CB# [phone].  Please advise.  Thank you.

## 2020-08-11 NOTE — Telephone Encounter (Signed)
Tried calling twice. First time someone answered but was unable to hear due to static. I called back a second time and no one answered. LMVM advising was returning call

## 2020-08-13 ENCOUNTER — Ambulatory Visit (INDEPENDENT_AMBULATORY_CARE_PROVIDER_SITE_OTHER): Payer: Medicare Other

## 2020-08-13 ENCOUNTER — Ambulatory Visit (INDEPENDENT_AMBULATORY_CARE_PROVIDER_SITE_OTHER): Payer: Medicare Other | Admitting: Orthopedic Surgery

## 2020-08-13 ENCOUNTER — Other Ambulatory Visit: Payer: Self-pay

## 2020-08-13 ENCOUNTER — Encounter: Payer: Self-pay | Admitting: Orthopedic Surgery

## 2020-08-13 DIAGNOSIS — I2510 Atherosclerotic heart disease of native coronary artery without angina pectoris: Secondary | ICD-10-CM

## 2020-08-13 DIAGNOSIS — M79602 Pain in left arm: Secondary | ICD-10-CM

## 2020-08-13 NOTE — Progress Notes (Signed)
Office Visit Note   Patient: Patricia Eaton           Date of Birth: June 13, 1950           MRN: [id number] Visit Date: 08/13/2020 Requested by: Lavada Mesi, MD [address] [place],  [place] [address] PCP: Lavada Mesi, MD  Subjective: Chief Complaint  Patient presents with  . Other    Left arm pain     HPI: Patricia Eaton is a 70 y.o. female who presents to the office complaining of left shoulder pain.  Patient complains of anterior shoulder pain in her left shoulder that has been going on for about 1 month and seems to be worsening.  She has pain that began as neck pain and now has traveled from her shoulder blade to her anterior shoulder.  She has radiation down to her elbow at times.  She wakes with pain at night.  Describes pain as a burning pain.  She does note tingling in her hand since about 2 days ago but this has been short-lived.  She does note subjective weakness of the shoulder but pain is much more of an issue for her than weakness.  She has occasional mechanical symptoms with moving the shoulder around.  Denies any history of neck or left shoulder surgery but she does have history of prior right-sided rotator cuff repair 7 years ago in [place].  She was "told I will need the left side done".  No recent injury..                ROS: All systems reviewed are negative as they relate to the chief complaint within the history of present illness.  Patient denies fevers or chills.  Assessment & Plan: Visit Diagnoses:  1. Left arm pain     Plan: Patient is a 70 year old female presents complaint of left shoulder pain.  She has history of left shoulder pain has been worsening over the last month without injury.  She has had right sided rotator cuff repair and has been told she will need the left side repaired back when she lived in [place].  She endorses clicking sensation in the shoulder.  No history of left shoulder surgery.  She does have pain with  strength testing of the rotator cuff on exam today as well as some crepitus noted in anterior aspect of the shoulder.  With her mechanical symptoms and increased pain especially at night, plan to order MRI arthrogram of the left shoulder to evaluate for rotator cuff tear.  Radiographs of the left shoulder are negative for any acute findings today and she does have some degenerative disc disease of the cervical spine.  If the MRI of the left shoulder is underwhelming or unremarkable, plan to proceed with working up the cervical spine next.  Follow-Up Instructions: No follow-ups on file.   Orders:  Orders Placed This Encounter  Procedures  . XR Cervical Spine 2 or 3 views  . XR Shoulder Left  . MR Shoulder Left w/ contrast  . Arthrogram   No orders of the defined types were placed in this encounter.     Procedures: No procedures performed   Clinical Data: No additional findings.  Objective: Vital Signs: There were no vitals taken for this visit.  Physical Exam:  Constitutional: Patient appears well-developed HEENT:  Head: Normocephalic Eyes:EOM are normal Neck: Normal range of motion Cardiovascular: Normal rate Pulmonary/chest: Effort normal Neurologic: Patient is alert Skin: Skin is warm Psychiatric:  Patient has normal mood and affect  Ortho Exam: Ortho exam demonstrates right shoulder with 65 degrees external rotation, 115 degrees abduction, 160 degrees forward flexion.  This compared with the left shoulder with 55 degrees external rotation, 1 to 10 degrees abduction, 145 degrees forward flexion.  Mild crepitus felt with passive range of motion of the left shoulder.  She has decent rotator cuff strength but moderate pain with supraspinatus and infraspinatus resistance testing.  She has negative Hornblower sign, negative drop arm test.  Mild tenderness over the [facility] joint.  She does have no tenderness over the axial cervical spine.  Negative Spurling sign.  Increased stiffness of  the cervical spine with looking to the left and down with increased pain in the neck as well.  Specialty Comments:  No specialty comments available.  Imaging: No results found.   PMFS History: Patient Active Problem List   Diagnosis Date Noted  . Lyme disease 05/07/2020  . H/O right hemicolectomy-for inflammatory mass unclear etiology 2003 04/01/2020  . Abdominal pain, chronic, right lower quadrant 04/01/2020  . Lumbar spinal stenosis 12/15/2019  . Leg cramps 12/15/2019  . Paresthesia 06/13/2018  . Lumbar radiculopathy 06/13/2018  . Abdominal pain 03/07/2018  . Nausea and vomiting 12/04/2017  . Chronic constipation 08/29/2017  . History of diverticulitis 08/29/2017  . GERD (gastroesophageal reflux disease) 08/29/2017  . Chronic low back pain 06/15/2017  . Heart palpitations 08/24/2016  . Chest pain, pleuritic 11/18/2015  . Thrombophilia (HCC) 09/23/2015  . Lung infiltrate 09/23/2014  . Ex-smoker 09/02/2014  . Reactive airway disease 09/02/2014  . Shortness of breath 09/02/2014  . History of pneumonia 08/31/2014  . History of pulmonary embolism 08/31/2014  . Anemia due to blood loss, acute 06/25/2014  . Neuropathic pain of lower extremity 12/21/2013  . Disc herniation 11/05/2012   Past Medical History:  Diagnosis Date  . Allergy to alpha-gal   . Angio-edema   . Anxiety   . Asthma    childhood asthma  . Colonic mass    In [place], found inconclusive  . Eczema   . Environmental allergies    trees, grass, oak trees  . GERD (gastroesophageal reflux disease)   . Leg cramps 12/15/2019  . Lumbar spinal stenosis 12/15/2019   L2-3 level  . Neuropathy   . Numbness   . Pulmonary embolism (HCC)   . Seasonal allergies     Family History  Problem Relation Age of Onset  . Colon cancer Maternal Aunt   . Crohn's disease Grandchild   . Crohn's disease Son   . Cancer Mother        kidney or liver  . Heart failure Father   . Heart attack Father   . Pancreatic cancer Sister    . Stroke Maternal Grandmother   . Allergic rhinitis Neg Hx   . Asthma Neg Hx   . Eczema Neg Hx   . Urticaria Neg Hx   . Esophageal cancer Neg Hx   . Stomach cancer Neg Hx     Past Surgical History:  Procedure Laterality Date  . APPENDECTOMY    . COLONOSCOPY  2018   [place]: diverticulosis  . ESOPHAGOGASTRODUODENOSCOPY N/A 01/09/2018   normal esophagus, normal residual stomach status post gastric obesity procedure.  [facility] [facility] GASTRIC BYPASS  2003  . LUMBAR FUSION  2016  . PARTIAL HYSTERECTOMY  1980  . TONSILLECTOMY    . WRIST FRACTURE SURGERY  2013   Social History   Occupational History  . Occupation: Retired from  trucking  Tobacco Use  . Smoking status: Former Smoker    Types: Cigarettes    Quit date: 05/29/1997    Years since quitting: 23.2  . Smokeless tobacco: Never Used  Vaping Use  . Vaping Use: Never used  Substance and Sexual Activity  . Alcohol use: Yes    Comment: occasional  . Drug use: Never  . Sexual activity: Not on file

## 2020-08-15 ENCOUNTER — Encounter: Payer: Self-pay | Admitting: Orthopedic Surgery

## 2020-08-17 ENCOUNTER — Telehealth: Payer: Self-pay

## 2020-08-17 ENCOUNTER — Other Ambulatory Visit: Payer: Self-pay | Admitting: Surgical

## 2020-08-17 DIAGNOSIS — J301 Allergic rhinitis due to pollen: Secondary | ICD-10-CM | POA: Diagnosis not present

## 2020-08-17 DIAGNOSIS — J3089 Other allergic rhinitis: Secondary | ICD-10-CM | POA: Diagnosis not present

## 2020-08-17 DIAGNOSIS — J3081 Allergic rhinitis due to animal (cat) (dog) hair and dander: Secondary | ICD-10-CM | POA: Diagnosis not present

## 2020-08-17 MED ORDER — DIAZEPAM 2 MG PO TABS: 2.0000 mg | ORAL_TABLET | Freq: Once | ORAL | 0 refills | Status: AC

## 2020-08-17 NOTE — Telephone Encounter (Signed)
Patient called she is requesting a rx to be sent to the pharmacy so she can complete her mri 4/13 due to her being claustrophobic call back:[phone]

## 2020-08-17 NOTE — Telephone Encounter (Signed)
Sent in RX for Valium with instructions on bottle.  Sent to [facility] in [place]

## 2020-08-18 NOTE — Telephone Encounter (Signed)
Patient aware this was called in for her  

## 2020-08-19 ENCOUNTER — Ambulatory Visit: Payer: Medicare Other | Admitting: Cardiovascular Disease

## 2020-08-23 ENCOUNTER — Ambulatory Visit: Payer: Medicare Other | Admitting: Orthopedic Surgery

## 2020-08-23 DIAGNOSIS — J301 Allergic rhinitis due to pollen: Secondary | ICD-10-CM | POA: Diagnosis not present

## 2020-08-23 DIAGNOSIS — J3089 Other allergic rhinitis: Secondary | ICD-10-CM | POA: Diagnosis not present

## 2020-08-23 DIAGNOSIS — J3081 Allergic rhinitis due to animal (cat) (dog) hair and dander: Secondary | ICD-10-CM | POA: Diagnosis not present

## 2020-08-26 ENCOUNTER — Other Ambulatory Visit: Payer: Self-pay | Admitting: Gastroenterology

## 2020-08-26 DIAGNOSIS — K2100 Gastro-esophageal reflux disease with esophagitis, without bleeding: Secondary | ICD-10-CM

## 2020-08-30 ENCOUNTER — Ambulatory Visit: Payer: Medicare Other | Admitting: Neurology

## 2020-08-30 ENCOUNTER — Telehealth: Payer: Self-pay | Admitting: Neurology

## 2020-08-30 DIAGNOSIS — J3081 Allergic rhinitis due to animal (cat) (dog) hair and dander: Secondary | ICD-10-CM | POA: Diagnosis not present

## 2020-08-30 DIAGNOSIS — J301 Allergic rhinitis due to pollen: Secondary | ICD-10-CM | POA: Diagnosis not present

## 2020-08-30 DIAGNOSIS — J3089 Other allergic rhinitis: Secondary | ICD-10-CM | POA: Diagnosis not present

## 2020-08-30 NOTE — Telephone Encounter (Signed)
Patient established with [facility] in November 2021. She will need to receive refills from [facility].

## 2020-08-30 NOTE — Telephone Encounter (Signed)
Noted  

## 2020-08-30 NOTE — Progress Notes (Deleted)
PATIENT: Patricia Eaton DOB: [birth year]  REASON FOR VISIT: follow up HISTORY FROM: patient  HISTORY OF PRESENT ILLNESS: Today 08/30/20 Patricia Eaton is a 70 year old female with history of low back pain with moderate to severe spinal stenosis at the L2-3 level with facet joint arthritis and neuroforaminal narrowing.  Had ESI in January and July 2021.  She takes tizanidine. HISTORY 12/15/2019 Dr. Anne Hahn: Patricia Eaton is a 70 year old right-handed white female with a history of chronic low back pain, she has had documented moderate to severe spinal stenosis at the L2-3 level with facet joint arthritis and neuroforaminal narrowing at this level as well.  The patient reports ongoing problems with low back pain and some discomfort into the hips and thighs, she will have muscle cramps that affect the thighs, left greater than right lower extremity.  She may have episodes twice a week that may occur during the day or during the night.  She has had an epidural steroid injection in January 2021 which resulted in some benefit for about a month.  The patient has tizanidine that she may take at night if she has muscle cramps.  She tries to stay active, she swims on a regular basis.   REVIEW OF SYSTEMS: Out of a complete 14 system review of symptoms, the patient complains only of the following symptoms, and all other reviewed systems are negative.  ALLERGIES: Allergies  Allergen Reactions  . Molds & Smuts Cough    And grass; cause sneezing and watery eyes, too  . Other Rash    Oak causes rash    HOME MEDICATIONS: Outpatient Medications Prior to Visit  Medication Sig Dispense Refill  . Ascorbic Acid (VITAMIN C WITH ROSE HIPS) 1000 MG tablet Take 1,000 mg by mouth daily.    Marland Kitchen aspirin EC 81 MG tablet Take 81 mg by mouth daily.    Marland Kitchen atorvastatin (LIPITOR) 10 MG tablet Take 1 tablet (10 mg total) by mouth daily. 90 tablet 3  . CALCIUM PO Take 500 mg by mouth daily.     .  cholecalciferol (VITAMIN D) 1000 units tablet Take 5,000 Units by mouth daily.     Marland Kitchen CRANBERRY PO Take 1 tablet by mouth daily.    Marland Kitchen doxycycline (VIBRAMYCIN) 100 MG capsule 1 PO BID x 6 weeks, then 2 weeks off, then repeat 84 capsule 3  . ferrous sulfate 325 (65 FE) MG tablet Take 325 mg by mouth daily with breakfast.    . fluticasone (FLONASE) 50 MCG/ACT nasal spray Place 2 sprays into both nostrils daily.    . hydrochlorothiazide (MICROZIDE) 12.5 MG capsule Take 1 capsule (12.5 mg total) by mouth daily as needed (for edema). 90 capsule 3  . MAGNESIUM PO Take 1 tablet by mouth daily.    . melatonin 3 MG TABS tablet Take 6 mg by mouth at bedtime.    Marland Kitchen omeprazole (PRILOSEC) 40 MG capsule Take 40 mg by mouth daily.    . polyethylene glycol powder (GLYCOLAX/MIRALAX) 17 GM/SCOOP powder Take 17 g by mouth daily.    . pregabalin (LYRICA) 100 MG capsule TAKE 1 CAPSULE EVERY MORNING AND 2 CAPSULES IN THE EVENING 270 capsule 1  . PRESCRIPTION MEDICATION Allergy shot three times weekly    . Probiotic Product (PROBIOTIC DAILY PO) Take 1 tablet by mouth daily.    . Quercetin 250 MG TABS Take 1 tablet by mouth daily.     Marland Kitchen tiZANidine (ZANAFLEX) 4 MG tablet Take 1 tablet by mouth daily as  needed.    . trimethoprim (TRIMPEX) 100 MG tablet Take 100 mg by mouth daily.    . Wheat Dextrin (BENEFIBER) POWD 1 tablespoon daily  0  . zinc gluconate 50 MG tablet Take 50 mg by mouth daily.     No facility-administered medications prior to visit.    PAST MEDICAL HISTORY: Past Medical History:  Diagnosis Date  . Allergy to alpha-gal   . Angio-edema   . Anxiety   . Asthma    childhood asthma  . Colonic mass    In [place], found inconclusive  . Eczema   . Environmental allergies    trees, grass, oak trees  . GERD (gastroesophageal reflux disease)   . Leg cramps 12/15/2019  . Lumbar spinal stenosis 12/15/2019   L2-3 level  . Neuropathy   . Numbness   . Pulmonary embolism (HCC)   . Seasonal allergies     PAST  SURGICAL HISTORY: Past Surgical History:  Procedure Laterality Date  . APPENDECTOMY    . COLONOSCOPY  2018   [place]: diverticulosis  . ESOPHAGOGASTRODUODENOSCOPY N/A 01/09/2018   normal esophagus, normal residual stomach status post gastric obesity procedure.  Marland Kitchen GASTRIC BYPASS  2003  . LUMBAR FUSION  2016  . PARTIAL HYSTERECTOMY  1980  . TONSILLECTOMY    . WRIST FRACTURE SURGERY  2013    FAMILY HISTORY: Family History  Problem Relation Age of Onset  . Colon cancer Maternal Aunt   . Crohn's disease Grandchild   . Crohn's disease Son   . Cancer Mother        kidney or liver  . Heart failure Father   . Heart attack Father   . Pancreatic cancer Sister   . Stroke Maternal Grandmother   . Allergic rhinitis Neg Hx   . Asthma Neg Hx   . Eczema Neg Hx   . Urticaria Neg Hx   . Esophageal cancer Neg Hx   . Stomach cancer Neg Hx     SOCIAL HISTORY: Social History   Socioeconomic History  . Marital status: Married    Spouse name: Not on file  . Number of children: 3  . Years of education: 67  . Highest education level: High school graduate  Occupational History  . Occupation: Retired from trucking  Tobacco Use  . Smoking status: Former Smoker    Types: Cigarettes    Quit date: 05/29/1997    Years since quitting: 23.2  . Smokeless tobacco: Never Used  Vaping Use  . Vaping Use: Never used  Substance and Sexual Activity  . Alcohol use: Yes    Comment: occasional  . Drug use: Never  . Sexual activity: Not on file  Other Topics Concern  . Not on file  Social History Narrative   Lives at home with husband.  Married multiple times.  3 grown sons also grandchildren and step grandchildren.   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    Social Determinants of Health   Financial Resource Strain: Not on file  Food Insecurity: Not  on file  Transportation Needs: Not on file  Physical Activity: Not on file  Stress: Not on file  Social Connections: Not on file  Intimate Partner Violence: Not on file      PHYSICAL EXAM  There were no vitals filed for this visit. There  is no height or weight on file to calculate BMI.  Generalized: Well developed, in no acute distress   Neurological examination  Mentation: Alert oriented to time, place, history taking. Follows all commands speech and language fluent Cranial nerve II-XII: Pupils were equal round reactive to light. Extraocular movements were full, visual field were full on confrontational test. Facial sensation and strength were normal. Uvula tongue midline. Head turning and shoulder shrug  were normal and symmetric. Motor: The motor testing reveals 5 over 5 strength of all 4 extremities. Good symmetric motor tone is noted throughout.  Sensory: Sensory testing is intact to soft touch on all 4 extremities. No evidence of extinction is noted.  Coordination: Cerebellar testing reveals good finger-nose-finger and heel-to-shin bilaterally.  Gait and station: Gait is normal. Tandem gait is normal. Romberg is negative. No drift is seen.  Reflexes: Deep tendon reflexes are symmetric and normal bilaterally.   DIAGNOSTIC DATA (LABS, IMAGING, TESTING) - I reviewed patient records, labs, notes, testing and imaging myself where available.  Lab Results  Component Value Date   WBC 12.2 (H) 10/29/2019   HGB 14.2 10/29/2019   HCT 42.1 10/29/2019   MCV 91 10/29/2019      Component Value Date/Time   BUN 14 02/15/2018 0940   CREATININE 0.80 01/09/2019 1220   PROT 6.3 05/26/2020 0853   ALBUMIN 4.1 05/26/2020 0853   AST 13 05/26/2020 0853   ALT 11 05/26/2020 0853   ALKPHOS 127 (H) 05/26/2020 0853   BILITOT 0.3 05/26/2020 0853   GFRNONAA >60 02/15/2018 0940   GFRAA >60 02/15/2018 0940   Lab Results  Component Value Date   CHOL 134 05/26/2020   HDL 63 05/26/2020   LDLCALC  52 05/26/2020   TRIG 102 05/26/2020   CHOLHDL 3.8 02/27/2020   No results found for: HGBA1C No results found for: VITAMINB12 Lab Results  Component Value Date   TSH 1.420 10/29/2019      ASSESSMENT AND PLAN 70 y.o. year old female  has a past medical history of Allergy to alpha-gal, Angio-edema, Anxiety, Asthma, Colonic mass, Eczema, Environmental allergies, GERD (gastroesophageal reflux disease), Leg cramps (12/15/2019), Lumbar spinal stenosis (12/15/2019), Neuropathy, Numbness, Pulmonary embolism (HCC), and Seasonal allergies. here with ***   I spent 15 minutes with the patient. 50% of this time was spent   Margie Ege, [place], DNP 08/30/2020, 5:50 AM [facility] [address] [place], [place] [address] [phone]

## 2020-08-30 NOTE — Telephone Encounter (Signed)
FYI: Pt called, would like to cancel the appt for today. I prefer to come after the MRI so we will be able to go cover the MRI results too. I have rescheduled 11/16/20.

## 2020-09-08 ENCOUNTER — Ambulatory Visit
Admission: RE | Admit: 2020-09-08 | Discharge: 2020-09-08 | Disposition: A | Discharge status: Home / Self Care | Payer: Medicare Other | Source: Ambulatory Visit | Attending: Orthopedic Surgery | Admitting: Orthopedic Surgery

## 2020-09-08 ENCOUNTER — Other Ambulatory Visit: Payer: Self-pay

## 2020-09-08 ENCOUNTER — Telehealth: Payer: Self-pay | Admitting: Cardiovascular Disease

## 2020-09-08 ENCOUNTER — Telehealth: Payer: Self-pay | Admitting: Orthopedic Surgery

## 2020-09-08 DIAGNOSIS — M25512 Pain in left shoulder: Secondary | ICD-10-CM | POA: Diagnosis not present

## 2020-09-08 DIAGNOSIS — M79602 Pain in left arm: Secondary | ICD-10-CM

## 2020-09-08 DIAGNOSIS — J3089 Other allergic rhinitis: Secondary | ICD-10-CM | POA: Diagnosis not present

## 2020-09-08 DIAGNOSIS — J3081 Allergic rhinitis due to animal (cat) (dog) hair and dander: Secondary | ICD-10-CM | POA: Diagnosis not present

## 2020-09-08 DIAGNOSIS — J301 Allergic rhinitis due to pollen: Secondary | ICD-10-CM | POA: Diagnosis not present

## 2020-09-08 MED ORDER — ATORVASTATIN CALCIUM 10 MG PO TABS: 10.0000 mg | ORAL_TABLET | Freq: Every day | ORAL | 3 refills | Status: DC

## 2020-09-08 MED ORDER — IOPAMIDOL (ISOVUE-M 200) INJECTION 41%
15.0000 mL | Freq: Once | INTRAMUSCULAR | Status: AC
  Administered 2020-09-08: 15 mL via INTRA_ARTICULAR

## 2020-09-08 NOTE — Telephone Encounter (Signed)
*  STAT* If patient is at the pharmacy, call can be transferred to refill team.   1. Which medications need to be refilled? (please list name of each medication and dose if known) atorvastatin (LIPITOR) 10 MG tablet(Expired)  2. Which pharmacy/location (including street and city if local pharmacy) is medication to be sent to? [facility] - [place], [place] - [address]  3. Do they need a 30 day or 90 day supply? 90 day supply

## 2020-09-08 NOTE — Telephone Encounter (Signed)
Patient called and stated she had an MRI and was unable to go thru with it. She was medicated but still unable too. Patient need to know if she should keep her appt and what is the step. Please call patient [phone].

## 2020-09-08 NOTE — Telephone Encounter (Signed)
This is a [place] pt.  °

## 2020-09-08 NOTE — Telephone Encounter (Signed)
Medication refill request approved for Lipitor 10 mg tablets and sent to [facility] in [place] per patient request.

## 2020-09-09 NOTE — Telephone Encounter (Signed)
Ct arthrogram of shoulder next thx

## 2020-09-13 ENCOUNTER — Other Ambulatory Visit: Payer: Self-pay

## 2020-09-13 ENCOUNTER — Telehealth: Payer: Self-pay

## 2020-09-13 DIAGNOSIS — M79602 Pain in left arm: Secondary | ICD-10-CM

## 2020-09-13 MED ORDER — HYDROCOD POLST-CPM POLST ER 10-8 MG/5ML PO SUER: 5.0000 mL | Freq: Two times a day (BID) | ORAL | 0 refills | Status: DC | PRN

## 2020-09-13 NOTE — Telephone Encounter (Signed)
Cough meds sent to pharmacy.  She should probably get tested for covid.

## 2020-09-13 NOTE — Telephone Encounter (Signed)
Clarify with Dr August Saucer if needs to be thin cut and arthrogram or just arthrogram.

## 2020-09-13 NOTE — Telephone Encounter (Signed)
I called and left voice mail advising of the medication sent in to the pharmacy and his recommendation on covid testing.

## 2020-09-13 NOTE — Addendum Note (Signed)
Addended by: Lillia Carmel on: 09/13/2020 10:23 AM   Modules accepted: Orders

## 2020-09-13 NOTE — Telephone Encounter (Signed)
Patient called she stated her and her husband has been exposed to Covid but feels like she has the flu patient stated she is having a low grade fever and has been coughing a lot and hasn't been able to get any rest she is requesting rx to be sent to the pharmacy specifically something stronger then OTC medication call back:[phone] [facility] [place]

## 2020-09-13 NOTE — Telephone Encounter (Signed)
See below. Per Dr August Saucer he would like patient to try CT arthrogram of shoulder.  I have notified patient.

## 2020-09-15 ENCOUNTER — Ambulatory Visit: Payer: Medicare Other | Admitting: Orthopedic Surgery

## 2020-09-15 NOTE — Telephone Encounter (Signed)
noted 

## 2020-09-24 DIAGNOSIS — J301 Allergic rhinitis due to pollen: Secondary | ICD-10-CM | POA: Diagnosis not present

## 2020-09-24 DIAGNOSIS — J3081 Allergic rhinitis due to animal (cat) (dog) hair and dander: Secondary | ICD-10-CM | POA: Diagnosis not present

## 2020-09-24 DIAGNOSIS — J3089 Other allergic rhinitis: Secondary | ICD-10-CM | POA: Diagnosis not present

## 2020-09-29 ENCOUNTER — Other Ambulatory Visit: Payer: Self-pay | Admitting: Neurology

## 2020-09-29 ENCOUNTER — Ambulatory Visit
Admission: RE | Admit: 2020-09-29 | Discharge: 2020-09-29 | Disposition: A | Discharge status: Home / Self Care | Payer: Medicare Other | Source: Ambulatory Visit | Attending: Orthopedic Surgery | Admitting: Orthopedic Surgery

## 2020-09-29 ENCOUNTER — Other Ambulatory Visit: Payer: Self-pay

## 2020-09-29 DIAGNOSIS — J3089 Other allergic rhinitis: Secondary | ICD-10-CM | POA: Diagnosis not present

## 2020-09-29 DIAGNOSIS — M25512 Pain in left shoulder: Secondary | ICD-10-CM | POA: Diagnosis not present

## 2020-09-29 DIAGNOSIS — J301 Allergic rhinitis due to pollen: Secondary | ICD-10-CM | POA: Diagnosis not present

## 2020-09-29 DIAGNOSIS — M79602 Pain in left arm: Secondary | ICD-10-CM

## 2020-09-29 DIAGNOSIS — M19012 Primary osteoarthritis, left shoulder: Secondary | ICD-10-CM | POA: Diagnosis not present

## 2020-09-29 DIAGNOSIS — J3081 Allergic rhinitis due to animal (cat) (dog) hair and dander: Secondary | ICD-10-CM | POA: Diagnosis not present

## 2020-09-29 MED ORDER — PREGABALIN 100 MG PO CAPS: ORAL_CAPSULE | ORAL | 1 refills | Status: DC

## 2020-09-29 MED ORDER — IOPAMIDOL (ISOVUE-M 200) INJECTION 41%
13.0000 mL | Freq: Once | INTRAMUSCULAR | Status: AC
  Administered 2020-09-29: 13 mL via INTRA_ARTICULAR

## 2020-09-29 NOTE — Addendum Note (Signed)
Addended by: Alexia Freestone R on: 09/29/2020 01:57 PM   Modules accepted: Orders

## 2020-09-29 NOTE — Telephone Encounter (Signed)
Pt is requesting a refill for  pregabalin (LYRICA) 100 MG capsule.  Pharmacy: [facility]

## 2020-10-02 NOTE — Progress Notes (Signed)
Hi Lauren did she have follow-up appointment?

## 2020-10-06 ENCOUNTER — Ambulatory Visit (INDEPENDENT_AMBULATORY_CARE_PROVIDER_SITE_OTHER): Payer: Medicare Other | Admitting: Orthopedic Surgery

## 2020-10-06 DIAGNOSIS — J3089 Other allergic rhinitis: Secondary | ICD-10-CM | POA: Diagnosis not present

## 2020-10-06 DIAGNOSIS — M79602 Pain in left arm: Secondary | ICD-10-CM | POA: Diagnosis not present

## 2020-10-06 DIAGNOSIS — I2510 Atherosclerotic heart disease of native coronary artery without angina pectoris: Secondary | ICD-10-CM

## 2020-10-06 DIAGNOSIS — J301 Allergic rhinitis due to pollen: Secondary | ICD-10-CM | POA: Diagnosis not present

## 2020-10-06 DIAGNOSIS — J3081 Allergic rhinitis due to animal (cat) (dog) hair and dander: Secondary | ICD-10-CM | POA: Diagnosis not present

## 2020-10-07 ENCOUNTER — Encounter: Payer: Self-pay | Admitting: Orthopedic Surgery

## 2020-10-07 ENCOUNTER — Encounter: Payer: Self-pay | Admitting: Family Medicine

## 2020-10-07 MED ORDER — PREDNISONE 10 MG PO TABS: ORAL_TABLET | ORAL | 0 refills | Status: DC

## 2020-10-07 NOTE — Addendum Note (Signed)
Addended by: Lillia Carmel on: 10/07/2020 09:14 AM   Modules accepted: Orders

## 2020-10-07 NOTE — Progress Notes (Signed)
Office Visit Note   Patient: Patricia Eaton           Date of Birth: 1951-02-26           MRN: [id number] Visit Date: 10/06/2020 Requested by: Lavada Mesi, MD [address] [place],  [place] [address] PCP: Lavada Mesi, MD  Subjective: Chief Complaint  Patient presents with  . Other     Scan review    HPI: Patricia Eaton is a patient with left shoulder posterior lateral pain is had CT scan.  Cannot do MRI scan because of claustrophobia.  Still is having some mild pain.  No definite mechanical symptoms.  She is not really interested in any type of injection.  CT scan which was an arthrogram is reviewed.  Rotator cuff is intact and no definite labral or chondral pathology is visualized              ROS: All systems reviewed are negative as they relate to the chief complaint within the history of present illness.  Patient denies  fevers or chills.   Assessment & Plan: Visit Diagnoses:  1. Left arm pain     Plan: Impression is left shoulder pain with good rotator cuff strength no limitation of passive range of motion and intact rotator cuff on CT arthrogram.  I think this could be an occult chondral defect or a posterior superior labral tear that the scan is not picking up.  She wants to hold off on any intervention.  She is pretty functional with the shoulder.  Follow-up as needed  Follow-Up Instructions: Return if symptoms worsen or fail to improve.   Orders:  No orders of the defined types were placed in this encounter.  No orders of the defined types were placed in this encounter.     Procedures: No procedures performed   Clinical Data: No additional findings.  Objective: Vital Signs: There were no vitals taken for this visit.  Physical Exam:   Constitutional: Patient appears well-developed HEENT:  Head: Normocephalic Eyes:EOM are normal Neck: Normal range of motion Cardiovascular: Normal rate Pulmonary/chest: Effort normal Neurologic: Patient is  alert Skin: Skin is warm Psychiatric: Patient has normal mood and affect    Ortho Exam: Ortho exam demonstrates full cervical spine range of motion with 5 out of 5 grip EPL FPL interosseous wrist flexion extension bicep triceps and deltoid strength.  Rotator cuff strength on the left is intact infraspinatus supraspinatus and subscap muscle testing.  No restriction of external rotation 15 degrees and AB duction.  Passive range of motion is 60/100/180.  No masses lymphadenopathy or skin changes noted in the left shoulder girdle region no AC joint tenderness is present.  Not much coarse grinding or crepitus present with passive range of motion of that left shoulder.  Specialty Comments:  No specialty comments available.  Imaging: No results found.   PMFS History: Patient Active Problem List   Diagnosis Date Noted  . Lyme disease 05/07/2020  . H/O right hemicolectomy-for inflammatory mass unclear etiology 2003 04/01/2020  . Abdominal pain, chronic, right lower quadrant 04/01/2020  . Lumbar spinal stenosis 12/15/2019  . Leg cramps 12/15/2019  . Paresthesia 06/13/2018  . Lumbar radiculopathy 06/13/2018  . Abdominal pain 03/07/2018  . Nausea and vomiting 12/04/2017  . Chronic constipation 08/29/2017  . History of diverticulitis 08/29/2017  . GERD (gastroesophageal reflux disease) 08/29/2017  . Chronic low back pain 06/15/2017  . Heart palpitations 08/24/2016  . Chest pain, pleuritic 11/18/2015  . Thrombophilia (HCC) 09/23/2015  .  Lung infiltrate 09/23/2014  . Ex-smoker 09/02/2014  . Reactive airway disease 09/02/2014  . Shortness of breath 09/02/2014  . History of pneumonia 08/31/2014  . History of pulmonary embolism 08/31/2014  . Anemia due to blood loss, acute 06/25/2014  . Neuropathic pain of lower extremity 12/21/2013  . Disc herniation 11/05/2012   Past Medical History:  Diagnosis Date  . Allergy to alpha-gal   . Angio-edema   . Anxiety   . Asthma    childhood asthma   . Colonic mass    In [place], found inconclusive  . Eczema   . Environmental allergies    trees, grass, oak trees  . GERD (gastroesophageal reflux disease)   . Leg cramps 12/15/2019  . Lumbar spinal stenosis 12/15/2019   L2-3 level  . Neuropathy   . Numbness   . Pulmonary embolism (HCC)   . Seasonal allergies     Family History  Problem Relation Age of Onset  . Colon cancer Maternal Aunt   . Crohn's disease Grandchild   . Crohn's disease Son   . Cancer Mother        kidney or liver  . Heart failure Father   . Heart attack Father   . Pancreatic cancer Sister   . Stroke Maternal Grandmother   . Allergic rhinitis Neg Hx   . Asthma Neg Hx   . Eczema Neg Hx   . Urticaria Neg Hx   . Esophageal cancer Neg Hx   . Stomach cancer Neg Hx     Past Surgical History:  Procedure Laterality Date  . APPENDECTOMY    . COLONOSCOPY  2018   [place]: diverticulosis  . ESOPHAGOGASTRODUODENOSCOPY N/A 01/09/2018   normal esophagus, normal residual stomach status post gastric obesity procedure.  Marland Kitchen GASTRIC BYPASS  2003  . LUMBAR FUSION  2016  . PARTIAL HYSTERECTOMY  1980  . TONSILLECTOMY    . WRIST FRACTURE SURGERY  2013   Social History   Occupational History  . Occupation: Retired from trucking  Tobacco Use  . Smoking status: Former Smoker    Types: Cigarettes    Quit date: 05/29/1997    Years since quitting: 23.3  . Smokeless tobacco: Never Used  Vaping Use  . Vaping Use: Never used  Substance and Sexual Activity  . Alcohol use: Yes    Comment: occasional  . Drug use: Never  . Sexual activity: Not on file

## 2020-10-12 DIAGNOSIS — J3081 Allergic rhinitis due to animal (cat) (dog) hair and dander: Secondary | ICD-10-CM | POA: Diagnosis not present

## 2020-10-12 DIAGNOSIS — J301 Allergic rhinitis due to pollen: Secondary | ICD-10-CM | POA: Diagnosis not present

## 2020-10-12 DIAGNOSIS — J3089 Other allergic rhinitis: Secondary | ICD-10-CM | POA: Diagnosis not present

## 2020-10-18 ENCOUNTER — Encounter: Payer: Self-pay | Admitting: Family Medicine

## 2020-10-18 DIAGNOSIS — J3089 Other allergic rhinitis: Secondary | ICD-10-CM | POA: Diagnosis not present

## 2020-10-18 DIAGNOSIS — J3081 Allergic rhinitis due to animal (cat) (dog) hair and dander: Secondary | ICD-10-CM | POA: Diagnosis not present

## 2020-10-18 DIAGNOSIS — J301 Allergic rhinitis due to pollen: Secondary | ICD-10-CM | POA: Diagnosis not present

## 2020-10-28 DIAGNOSIS — J3081 Allergic rhinitis due to animal (cat) (dog) hair and dander: Secondary | ICD-10-CM | POA: Diagnosis not present

## 2020-10-28 DIAGNOSIS — J301 Allergic rhinitis due to pollen: Secondary | ICD-10-CM | POA: Diagnosis not present

## 2020-10-28 DIAGNOSIS — J3089 Other allergic rhinitis: Secondary | ICD-10-CM | POA: Diagnosis not present

## 2020-11-05 DIAGNOSIS — J301 Allergic rhinitis due to pollen: Secondary | ICD-10-CM | POA: Diagnosis not present

## 2020-11-05 DIAGNOSIS — J3089 Other allergic rhinitis: Secondary | ICD-10-CM | POA: Diagnosis not present

## 2020-11-05 DIAGNOSIS — J3081 Allergic rhinitis due to animal (cat) (dog) hair and dander: Secondary | ICD-10-CM | POA: Diagnosis not present

## 2020-11-10 DIAGNOSIS — J301 Allergic rhinitis due to pollen: Secondary | ICD-10-CM | POA: Diagnosis not present

## 2020-11-10 DIAGNOSIS — J3089 Other allergic rhinitis: Secondary | ICD-10-CM | POA: Diagnosis not present

## 2020-11-10 DIAGNOSIS — J3081 Allergic rhinitis due to animal (cat) (dog) hair and dander: Secondary | ICD-10-CM | POA: Diagnosis not present

## 2020-11-16 ENCOUNTER — Ambulatory Visit (INDEPENDENT_AMBULATORY_CARE_PROVIDER_SITE_OTHER): Payer: Medicare Other | Admitting: Neurology

## 2020-11-16 ENCOUNTER — Encounter: Payer: Self-pay | Admitting: Neurology

## 2020-11-16 VITALS — BP 112/76 | HR 66 | Ht 67.0 in | Wt 202.6 lb

## 2020-11-16 DIAGNOSIS — H0288A Meibomian gland dysfunction right eye, upper and lower eyelids: Secondary | ICD-10-CM | POA: Diagnosis not present

## 2020-11-16 DIAGNOSIS — M5416 Radiculopathy, lumbar region: Secondary | ICD-10-CM

## 2020-11-16 DIAGNOSIS — J3089 Other allergic rhinitis: Secondary | ICD-10-CM | POA: Diagnosis not present

## 2020-11-16 DIAGNOSIS — J3081 Allergic rhinitis due to animal (cat) (dog) hair and dander: Secondary | ICD-10-CM | POA: Diagnosis not present

## 2020-11-16 DIAGNOSIS — J301 Allergic rhinitis due to pollen: Secondary | ICD-10-CM | POA: Diagnosis not present

## 2020-11-16 DIAGNOSIS — H0288B Meibomian gland dysfunction left eye, upper and lower eyelids: Secondary | ICD-10-CM | POA: Diagnosis not present

## 2020-11-16 DIAGNOSIS — Z961 Presence of intraocular lens: Secondary | ICD-10-CM | POA: Diagnosis not present

## 2020-11-16 NOTE — Progress Notes (Signed)
I have read the note, and I agree with the clinical assessment and plan.  Jamir Rone K Norita Meigs   

## 2020-11-16 NOTE — Patient Instructions (Signed)
Will continue the Lyrica at current dosing, was filled on 09/29/20, refills should be current See you back in 1 year

## 2020-11-16 NOTE — Progress Notes (Signed)
PATIENT: Patricia Eaton DOB: June 12, 1950  REASON FOR VISIT: Follow up HISTORY FROM: Patient Primary Neurologist: Dr. Anne Hahn   Today 11/16/20 Patricia Eaton is a 70 year old female with history of chronic low back pain.  Has moderate to severe spinal stenosis at L2-3 level with facet joint arthritis and neuroforaminal narrowing at this level.  Has not wished to consider surgery. Dr. Anne Hahn orders ESIs.  She receives Lyrica from this office 100/200 mg daily. Also has Lyme's disease, gets tired easily. Is very active. Lyrica helps the pain and cramps. Takes tizanidine, if she wakes during the night with cramps, once every 2 weeks. If extremely active knows she will have to take it. Sees orthopedics Dr. August Saucer had ESI lumbar, didn't work for her. Here today alone, currently happy with treatment plan.  HISTORY Update 12/15/2019 Dr. Anne Hahn: Patricia Eaton is a 70 year old right-handed white female with a history of chronic low back pain, she has had documented moderate to severe spinal stenosis at the L2-3 level with facet joint arthritis and neuroforaminal narrowing at this level as well.  The patient reports ongoing problems with low back pain and some discomfort into the hips and thighs, she will have muscle cramps that affect the thighs, left greater than right lower extremity.  She may have episodes twice a week that may occur during the day or during the night.  She has had an epidural steroid injection in January 2021 which resulted in some benefit for about a month.  The patient has tizanidine that she may take at night if she has muscle cramps.  She tries to stay active, she swims on a regular basis.  REVIEW OF SYSTEMS: Out of a complete 14 system review of symptoms, the patient complains only of the following symptoms, and all other reviewed systems are negative.  See HPI  ALLERGIES: Allergies  Allergen Reactions   Molds & Smuts Cough    And grass; cause sneezing  and watery eyes, too   Other Rash    Oak causes rash    HOME MEDICATIONS: Outpatient Medications Prior to Visit  Medication Sig Dispense Refill   Ascorbic Acid (VITAMIN C WITH ROSE HIPS) 1000 MG tablet Take 1,000 mg by mouth daily.     aspirin EC 81 MG tablet Take 81 mg by mouth daily.     atorvastatin (LIPITOR) 10 MG tablet Take 1 tablet (10 mg total) by mouth daily. 90 tablet 3   CALCIUM PO Take 500 mg by mouth daily.      cholecalciferol (VITAMIN D) 1000 units tablet Take 5,000 Units by mouth daily.      CRANBERRY PO Take 1 tablet by mouth daily.     ferrous sulfate 325 (65 FE) MG tablet Take 325 mg by mouth daily with breakfast.     fluticasone (FLONASE) 50 MCG/ACT nasal spray Place 2 sprays into both nostrils daily.     hydrochlorothiazide (MICROZIDE) 12.5 MG capsule Take 1 capsule (12.5 mg total) by mouth daily as needed (for edema). 90 capsule 3   MAGNESIUM PO Take 1 tablet by mouth daily.     melatonin 3 MG TABS tablet Take 6 mg by mouth at bedtime.     omeprazole (PRILOSEC) 40 MG capsule Take 40 mg by mouth daily.     polyethylene glycol powder (GLYCOLAX/MIRALAX) 17 GM/SCOOP powder Take 17 g by mouth daily.     predniSONE (DELTASONE) 10 MG tablet Take as directed for 12 days.  Daily dose 6,6,5,5,4,4,3,3,2,2,1,1. 42 tablet 0  PRESCRIPTION MEDICATION Allergy shot three times weekly     Quercetin 250 MG TABS Take 1 tablet by mouth daily.      tiZANidine (ZANAFLEX) 4 MG tablet Take 1 tablet by mouth daily as needed.     trimethoprim (TRIMPEX) 100 MG tablet Take 100 mg by mouth daily.     Wheat Dextrin (BENEFIBER) POWD 1 tablespoon daily  0   zinc gluconate 50 MG tablet Take 50 mg by mouth daily.     chlorpheniramine-HYDROcodone (TUSSIONEX PENNKINETIC ER) 10-8 MG/5ML SUER Take 5 mLs by mouth every 12 (twelve) hours as needed for cough. 115 mL 0   doxycycline (VIBRAMYCIN) 100 MG capsule 1 PO BID x 6 weeks, then 2 weeks off, then repeat 84 capsule 3   pregabalin (LYRICA) 100 MG  capsule TAKE 1 CAPSULE EVERY MORNING AND 2 CAPSULES IN THE EVENING 270 capsule 1   Probiotic Product (PROBIOTIC DAILY PO) Take 1 tablet by mouth daily.     No facility-administered medications prior to visit.    PAST MEDICAL HISTORY: Past Medical History:  Diagnosis Date   Allergy to alpha-gal    Angio-edema    Anxiety    Asthma    childhood asthma   Colonic mass    In [place], found inconclusive   Eczema    Environmental allergies    trees, grass, oak trees   GERD (gastroesophageal reflux disease)    Leg cramps 12/15/2019   Lumbar spinal stenosis 12/15/2019   L2-3 level   Neuropathy    Numbness    Pulmonary embolism (HCC)    Seasonal allergies     PAST SURGICAL HISTORY: Past Surgical History:  Procedure Laterality Date   APPENDECTOMY     COLONOSCOPY  2018   [place]: diverticulosis   ESOPHAGOGASTRODUODENOSCOPY N/A 01/09/2018   normal esophagus, normal residual stomach status post gastric obesity procedure.   GASTRIC BYPASS  2003   LUMBAR FUSION  2016   PARTIAL HYSTERECTOMY  1980   TONSILLECTOMY     WRIST FRACTURE SURGERY  2013    FAMILY HISTORY: Family History  Problem Relation Age of Onset   Colon cancer Maternal Aunt    Crohn's disease Grandchild    Crohn's disease Son    Cancer Mother        kidney or liver   Heart failure Father    Heart attack Father    Pancreatic cancer Sister    Stroke Maternal Grandmother    Allergic rhinitis Neg Hx    Asthma Neg Hx    Eczema Neg Hx    Urticaria Neg Hx    Esophageal cancer Neg Hx    Stomach cancer Neg Hx     SOCIAL HISTORY: Social History   Socioeconomic History   Marital status: Married    Spouse name: Not on file   Number of children: 3   Years of education: 12   Highest education level: High school graduate  Occupational History   Occupation: Retired from trucking  Tobacco Use   Smoking status: Former    Pack years: 0.00    Types: Cigarettes    Quit date: 05/29/1997    Years since quitting: 23.4    Smokeless tobacco: Never  Vaping Use   Vaping Use: Never used  Substance and Sexual Activity   Alcohol use: Yes    Comment: occasional   Drug use: Never   Sexual activity: Not on file  Other Topics Concern   Not on file  Social History Narrative  Lives at home with husband.  Married multiple times.  3 grown sons also grandchildren and step grandchildren.   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    Social Determinants of Health   Financial Resource Strain: Not on file  Food Insecurity: Not on file  Transportation Needs: Not on file  Physical Activity: Not on file  Stress: Not on file  Social Connections: Not on file  Intimate Partner Violence: Not on file   PHYSICAL EXAM  Vitals:   11/16/20 0832  BP: 112/76  Pulse: 66  Weight: 202 lb 9.6 oz (91.9 kg)  Height: 5\' 7"  (1.702 m)   Body mass index is 31.73 kg/m.  Generalized: Well developed, in no acute distress   Neurological examination  Mentation: Alert oriented to time, place, history taking. Follows all commands speech and language fluent Cranial nerve II-XII: Pupils were equal round reactive to light. Extraocular movements were full, visual field were full on confrontational test. Facial sensation and strength were normal. Head turning and shoulder shrug  were normal and symmetric. Motor: The motor testing reveals 5 over 5 strength of all 4 extremities. Good symmetric motor tone is noted throughout.  Sensory: Sensory testing is intact to soft touch on all 4 extremities. No evidence of extinction is noted.  Coordination: Cerebellar testing reveals good finger-nose-finger and heel-to-shin bilaterally.  Gait and station: Gait is normal. Tandem gait is normal.  Reflexes: Deep tendon reflexes are symmetric and normal bilaterally.   DIAGNOSTIC DATA (LABS, IMAGING, TESTING) - I  reviewed patient records, labs, notes, testing and imaging myself where available.  Lab Results  Component Value Date   WBC 12.2 (H) 10/29/2019   HGB 14.2 10/29/2019   HCT 42.1 10/29/2019   MCV 91 10/29/2019      Component Value Date/Time   BUN 14 02/15/2018 0940   CREATININE 0.80 01/09/2019 1220   PROT 6.3 05/26/2020 0853   ALBUMIN 4.1 05/26/2020 0853   AST 13 05/26/2020 0853   ALT 11 05/26/2020 0853   ALKPHOS 127 (H) 05/26/2020 0853   BILITOT 0.3 05/26/2020 0853   GFRNONAA >60 02/15/2018 0940   GFRAA >60 02/15/2018 0940   Lab Results  Component Value Date   CHOL 134 05/26/2020   HDL 63 05/26/2020   LDLCALC 52 05/26/2020   TRIG 102 05/26/2020   CHOLHDL 3.8 02/27/2020   No results found for: HGBA1C No results found for: VITAMINB12 Lab Results  Component Value Date   TSH 1.420 10/29/2019    ASSESSMENT AND PLAN 70 y.o. year old female  has a past medical history of Allergy to alpha-gal, Angio-edema, Anxiety, Asthma, Colonic mass, Eczema, Environmental allergies, GERD (gastroesophageal reflux disease), Leg cramps (12/15/2019), Lumbar spinal stenosis (12/15/2019), Neuropathy, Numbness, Pulmonary embolism (HCC), and Seasonal allergies. here with:  1.  Lumbar spinal stenosis, L2-3 level 2.  Leg cramps  -Continue Lyrica 100/200 mg daily, last fill was 09/29/20 90-day supply, takes tizanidine as needed -Encouraged to continue activity -Has not found ESI to be helpful, is not interested in surgical opinion, symptoms are stable for now, discussed red flag symptoms to watch for -Follow-up in 1 year or sooner if needed  11/29/20, AGNP-C, DNP 11/16/2020, 9:10 AM [facility] [address] [place], [place] [phone]

## 2020-11-22 DIAGNOSIS — J3081 Allergic rhinitis due to animal (cat) (dog) hair and dander: Secondary | ICD-10-CM | POA: Diagnosis not present

## 2020-11-22 DIAGNOSIS — J3089 Other allergic rhinitis: Secondary | ICD-10-CM | POA: Diagnosis not present

## 2020-11-22 DIAGNOSIS — J301 Allergic rhinitis due to pollen: Secondary | ICD-10-CM | POA: Diagnosis not present

## 2020-12-02 DIAGNOSIS — J3089 Other allergic rhinitis: Secondary | ICD-10-CM | POA: Diagnosis not present

## 2020-12-02 DIAGNOSIS — J3081 Allergic rhinitis due to animal (cat) (dog) hair and dander: Secondary | ICD-10-CM | POA: Diagnosis not present

## 2020-12-02 DIAGNOSIS — J301 Allergic rhinitis due to pollen: Secondary | ICD-10-CM | POA: Diagnosis not present

## 2020-12-07 DIAGNOSIS — J3081 Allergic rhinitis due to animal (cat) (dog) hair and dander: Secondary | ICD-10-CM | POA: Diagnosis not present

## 2020-12-07 DIAGNOSIS — J301 Allergic rhinitis due to pollen: Secondary | ICD-10-CM | POA: Diagnosis not present

## 2020-12-07 DIAGNOSIS — J3089 Other allergic rhinitis: Secondary | ICD-10-CM | POA: Diagnosis not present

## 2020-12-16 DIAGNOSIS — J301 Allergic rhinitis due to pollen: Secondary | ICD-10-CM | POA: Diagnosis not present

## 2020-12-16 DIAGNOSIS — J3089 Other allergic rhinitis: Secondary | ICD-10-CM | POA: Diagnosis not present

## 2020-12-16 DIAGNOSIS — J3081 Allergic rhinitis due to animal (cat) (dog) hair and dander: Secondary | ICD-10-CM | POA: Diagnosis not present

## 2020-12-20 DIAGNOSIS — E669 Obesity, unspecified: Secondary | ICD-10-CM | POA: Diagnosis not present

## 2020-12-20 DIAGNOSIS — Z9884 Bariatric surgery status: Secondary | ICD-10-CM | POA: Diagnosis not present

## 2020-12-21 DIAGNOSIS — J3089 Other allergic rhinitis: Secondary | ICD-10-CM | POA: Diagnosis not present

## 2020-12-21 DIAGNOSIS — J3081 Allergic rhinitis due to animal (cat) (dog) hair and dander: Secondary | ICD-10-CM | POA: Diagnosis not present

## 2020-12-21 DIAGNOSIS — J301 Allergic rhinitis due to pollen: Secondary | ICD-10-CM | POA: Diagnosis not present

## 2020-12-27 DIAGNOSIS — J3081 Allergic rhinitis due to animal (cat) (dog) hair and dander: Secondary | ICD-10-CM | POA: Diagnosis not present

## 2020-12-27 DIAGNOSIS — J301 Allergic rhinitis due to pollen: Secondary | ICD-10-CM | POA: Diagnosis not present

## 2020-12-27 DIAGNOSIS — J3089 Other allergic rhinitis: Secondary | ICD-10-CM | POA: Diagnosis not present

## 2020-12-29 DIAGNOSIS — E785 Hyperlipidemia, unspecified: Secondary | ICD-10-CM | POA: Diagnosis not present

## 2020-12-29 DIAGNOSIS — R300 Dysuria: Secondary | ICD-10-CM | POA: Diagnosis not present

## 2020-12-29 DIAGNOSIS — K219 Gastro-esophageal reflux disease without esophagitis: Secondary | ICD-10-CM | POA: Diagnosis not present

## 2020-12-29 DIAGNOSIS — M539 Dorsopathy, unspecified: Secondary | ICD-10-CM | POA: Diagnosis not present

## 2021-01-04 DIAGNOSIS — J3081 Allergic rhinitis due to animal (cat) (dog) hair and dander: Secondary | ICD-10-CM | POA: Diagnosis not present

## 2021-01-04 DIAGNOSIS — J3089 Other allergic rhinitis: Secondary | ICD-10-CM | POA: Diagnosis not present

## 2021-01-04 DIAGNOSIS — J301 Allergic rhinitis due to pollen: Secondary | ICD-10-CM | POA: Diagnosis not present

## 2021-01-11 ENCOUNTER — Ambulatory Visit (INDEPENDENT_AMBULATORY_CARE_PROVIDER_SITE_OTHER): Payer: Medicare Other | Admitting: Family Medicine

## 2021-01-11 ENCOUNTER — Encounter: Payer: Self-pay | Admitting: Family Medicine

## 2021-01-11 ENCOUNTER — Encounter: Payer: Self-pay | Admitting: Internal Medicine

## 2021-01-11 ENCOUNTER — Ambulatory Visit (INDEPENDENT_AMBULATORY_CARE_PROVIDER_SITE_OTHER): Payer: Medicare Other | Admitting: Internal Medicine

## 2021-01-11 ENCOUNTER — Other Ambulatory Visit: Payer: Self-pay

## 2021-01-11 VITALS — BP 104/66 | HR 68 | Ht 66.5 in | Wt 200.1 lb

## 2021-01-11 DIAGNOSIS — R1033 Periumbilical pain: Secondary | ICD-10-CM | POA: Diagnosis not present

## 2021-01-11 DIAGNOSIS — R635 Abnormal weight gain: Secondary | ICD-10-CM | POA: Diagnosis not present

## 2021-01-11 DIAGNOSIS — E538 Deficiency of other specified B group vitamins: Secondary | ICD-10-CM | POA: Diagnosis not present

## 2021-01-11 DIAGNOSIS — R5382 Chronic fatigue, unspecified: Secondary | ICD-10-CM

## 2021-01-11 DIAGNOSIS — A6920 Lyme disease, unspecified: Secondary | ICD-10-CM

## 2021-01-11 DIAGNOSIS — R1115 Cyclical vomiting syndrome unrelated to migraine: Secondary | ICD-10-CM | POA: Diagnosis not present

## 2021-01-11 DIAGNOSIS — E559 Vitamin D deficiency, unspecified: Secondary | ICD-10-CM | POA: Diagnosis not present

## 2021-01-11 DIAGNOSIS — I2510 Atherosclerotic heart disease of native coronary artery without angina pectoris: Secondary | ICD-10-CM | POA: Diagnosis not present

## 2021-01-11 DIAGNOSIS — Z9884 Bariatric surgery status: Secondary | ICD-10-CM | POA: Diagnosis not present

## 2021-01-11 MED ORDER — ERGOCALCIFEROL 1.25 MG (50000 UT) PO CAPS
50000.0000 [IU] | ORAL_CAPSULE | ORAL | 3 refills | Status: DC
Start: 2021-01-11 — End: 2021-12-06

## 2021-01-11 NOTE — Progress Notes (Signed)
Patricia Eaton 70 y.o. 1950/09/11 [id number]  Assessment & Plan:   Encounter Diagnoses  Name Primary?   Periumbilical pain Yes   Persistent vomiting    Bariatric surgery status-status post Capella procedure    Weight gain following gastric bypass surgery    Will investigate with an EGD. The risks and benefits as well as alternatives of endoscopic procedure(s) have been discussed and reviewed. All questions answered. The patient agrees to proceed.   It sounds like she is at higher risk of food impaction like issues with this particular bariatric procedure she had, based upon the note I read from her July visit to the bariatric surgeon.  Additionally I reviewed this and this particular gastric bypass has a more tubular stomach with some mesh wrapped around the gastrointestinal anastomosis to try to prevent dilation.  I think this is where a food impaction could be occurring.  Perhaps that is what happened.  I do not know why she still having the pain and sensitivity to cold drinks especially.  At last visit I wondered if she had some sort of vagal nerve issues perhaps.  This does not sound like an internal hernia though 1 always has to consider that in the setting of bariatric gastric bypass procedures.  Regarding her weight gain I have the following recommendations:  Food diary  Sign up for the diet doctor website and investigate what options they have been recommend to help lose weight after weight regain after gastric bypass surgery.  She says she does not eat many carbs.  She has other issues with this history of alpha gal though she is introducing meat again.  I am puzzled by that because I thought that was a persistent issue but perhaps there is a way to desensitize.  She may need to go back to her bariatric surgeon.  She plans to copy the records of her surgical procedures and I will review.  I appreciate the opportunity to care for this patient. Copy to Dr.  Prince Eaton  Subjective:   Chief Complaint: Abdominal pain  HPI 70 year old woman with a history of chronic constipation and chronic recurrent nausea and vomiting last seen in November 2021.  Other problems include history of right hemicolectomy for an inflammatory mass in 2003, allergy to alpha gal, diverticulosis and chronic right lower quadrant abdominal pain.  I had recommended daily MiraLAX and Benefiber when she was seen last year.  She visited the surgeon that assisted her original bariatric surgeon, who is in practice in another area of the state.  I was able to see the note here as his assessment plan and HPI from July 25 visit  Patricia Mom, MD - 12/20/2020 10:15 AM EDT Formatting of this note is different from the original. Images from the original note were not included.  [facility] [employer] A. Jacqualine Code, MD, FACS  Surgery Consult Note  Assessment/Plan:  Patient status post gastric bypass procedure. Initial weight measured 267 current weight 204 pounds. Initial weight loss over 100 pounds. She has regained weight slowly over the past few years. Her biggest concern is nighttime hunger and snacking. She has high tolerance of crunchy food. She has been previously on a low meat diet due to Lyme's disease however she has slowly returned to a protein diet based in chicken and some red meat now. We discussed utilizing her banded pouch to greatest effectiveness for hunger. Nighttime hunger can be somewhat challenging due to both the type of calories as well as  the time of calories and so we will attempt to use topiramate for assistance in this regard. Medical history of GERD and will continue use of omeprazole as needed. We will check labs to monitor LFTs electrolytes.  History of Present Illness:  Reason for Consult: Status post gastric bypass surgery  Patricia Eaton is a pleasant 70 y.o. year old female who is seen in consultation at the request of  Referred Self for evaluation of gastric bypass surgery. Overall she is doing well. She is tolerating a regular diet. She does have continued intermittent episodes of vomiting if does not do well food. We discussed the reasons for this as she is undergone a Capella procedure which is a banded gastric bypass. Overall she is happy with her success however is somewhat frustrated by recent slow weight gain. She mostly notices this at night when she has increased hunger and snacks.     At some point after that visit she developed persistent vomiting on a daily basis for about 2 weeks.  She felt like she had food "stuck in there".  She managed to hold her weight and she very carefully sips of liquids.  Since that time and actually at the time that started she complains of a supraumbilical pain that can be intense some in the left upper quadrant area as well especially with cold liquids.  She relates that if she has something like a milkshake which she says she does not drink much, it will be quite intense.  This is bothersome and she says she has never had a problem like this before.  She had an upper GI series in 2020 at the direction of Dr. Wenda Low that was notable for a small hiatal hernia but otherwise normal postop anatomy she had an EGD in 2019 with similar findings by [name].  CT abdomen pelvis without contrast last year at [facility] urology without specific abnormality.  Some stress in her life lately as well.  Unfortunately had to put her dog down.  Wt Readings from Last 3 Encounters:  01/11/21 200 lb 2 oz (90.8 kg)  11/16/20 202 lb 9.6 oz (91.9 kg)  07/19/20 207 lb (93.9 kg)    Allergies  Allergen Reactions   Molds & Smuts Cough    And grass; cause sneezing and watery eyes, too   Other Rash    Oak causes rash   Current Meds  Medication Sig   Ascorbic Acid (VITAMIN C WITH ROSE HIPS) 1000 MG tablet Take 1,000 mg by mouth daily.   aspirin EC 81 MG tablet Take 81 mg by mouth daily.    atorvastatin (LIPITOR) 10 MG tablet Take 1 tablet (10 mg total) by mouth daily.   CALCIUM PO Take 500 mg by mouth daily.    cholecalciferol (VITAMIN D) 1000 units tablet Take 5,000 Units by mouth daily.    CRANBERRY PO Take 1 tablet by mouth daily.   ferrous sulfate 325 (65 FE) MG tablet Take 325 mg by mouth daily with breakfast.   fluticasone (FLONASE) 50 MCG/ACT nasal spray Place 2 sprays into both nostrils daily.   hydrochlorothiazide (MICROZIDE) 12.5 MG capsule Take 1 capsule (12.5 mg total) by mouth daily as needed (for edema).   MAGNESIUM PO Take 1 tablet by mouth daily.   melatonin 3 MG TABS tablet Take 6 mg by mouth at bedtime.   Misc Natural Products (NEURIVA) CAPS See admin instructions.   Olopatadine HCl 0.2 % SOLN 1 drop into affected eye  omeprazole (PRILOSEC) 40 MG capsule Take 40 mg by mouth daily.   polyethylene glycol powder (GLYCOLAX/MIRALAX) 17 GM/SCOOP powder Take 17 g by mouth daily.   pregabalin (LYRICA) 100 MG capsule Take 100mg  in the morning and 200mg  at night   PRESCRIPTION MEDICATION Allergy shot three times weekly   tiZANidine (ZANAFLEX) 4 MG tablet Take 1 tablet by mouth daily as needed.   trimethoprim (TRIMPEX) 100 MG tablet Take 100 mg by mouth daily.   zinc gluconate 50 MG tablet Take 50 mg by mouth daily.   Past Medical History:  Diagnosis Date   Allergy to alpha-gal    Angio-edema    Anxiety    Asthma    childhood asthma   Colonic mass    In [place], found inconclusive   Eczema    Environmental allergies    trees, grass, oak trees   GERD (gastroesophageal reflux disease)    Leg cramps 12/15/2019   Lumbar spinal stenosis 12/15/2019   L2-3 level   Lyme disease    Neuropathy    Numbness    Pulmonary embolism (HCC)    Seasonal allergies    Past Surgical History:  Procedure Laterality Date   APPENDECTOMY     COLONOSCOPY  2018   [place] 12/17/2019: diverticulosis   ESOPHAGOGASTRODUODENOSCOPY N/A 01/09/2018   normal esophagus, normal residual stomach  status post gastric obesity procedure.   GASTRIC BYPASS  2003   LUMBAR FUSION  2016   PARTIAL HYSTERECTOMY  1980   TONSILLECTOMY     WRIST FRACTURE SURGERY  2013   Social History   Social History Narrative   Lives at home with husband.  Married multiple times.  3 grown sons also grandchildren and step grandchildren.   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    family history includes Cancer in her mother; Colon cancer in her maternal aunt; Crohn's disease in her grandchild and son; Heart attack in her father; Heart failure in her father; Pancreatic cancer in her sister; Stroke in her maternal grandmother.   Review of Systems As above very stressful lately had to put her dog down  Objective:   Physical Exam BP 104/66 (BP Location: Left Arm, Patient Position: Sitting, Cuff Size: Normal)   Pulse 68   Ht 5' 6.5" (1.689 m) Comment: height measured without shoes  Wt 200 lb 2 oz (90.8 kg)   BMI 31.82 kg/m  Well-developed well-nourished no acute distress white woman looking stated age Lungs clear anteriorly Normal heart sounds Abdomen shows surgical scars, she is tender above into the left of the umbilicus with mild to moderate pain.  Negative Carnett's sign.  Bowel sounds are present but not increased.

## 2021-01-11 NOTE — Progress Notes (Signed)
Office Visit Note   Patient: Patricia Eaton           Date of Birth: [birth year]           MRN: [id number] Visit Date: 01/11/2021 Requested by: Lavada Mesi, MD [address] [place],  [place] [address] PCP: Lavada Mesi, MD  Subjective: Chief Complaint  Patient presents with   Other    Follow up Lyme's disease. She used to take vit B12 -- should she restart this. Interested in taking vitamin D3 once  week. Just saw Dr. Leone Payor -- will be having an endoscopy soon.    HPI: She is here for follow-up chronic fatigue.  Lyme titers were positive last December.  She took 2 rounds of doxycycline but did not notice any improvement.  She still feels tired all the time.  No fevers or chills, no night sweats, no unintentional weight change.  She also wonders whether she needs to continue taking B12 injections.  She was on them once monthly but did not notice any difference.  She would like to simplify her vitamin regimen and take vitamin D once weekly instead of daily.                ROS:   All other systems were reviewed and are negative.  Objective: Vital Signs: There were no vitals taken for this visit.  Physical Exam:  General:  Alert and oriented, in no acute distress. Pulm:  Breathing unlabored. Psy:  Normal mood, congruent affect. Skin: No significant rash Neck: No lymphadenopathy, no thyromegaly. CV: Regular rate and rhythm without murmurs, rubs, or gallops.  No peripheral edema.  2+ radial and posterior tibial pulses. Lungs: Clear to auscultation throughout with no wheezing or areas of consolidation.    Imaging: No results found.  Assessment & Plan: Chronic fatigue with positive Lyme titers -We will check some other labs today.  Could contemplate referral to [facility] for alternative treatments for Lyme disease.  2.  B12 deficiency - Recheck levels.  Consider once weekly dosing.  3.  Vitamin D deficiency - Recheck levels today.  Switch to  once weekly dosing.     Procedures: No procedures performed        PMFS History: Patient Active Problem List   Diagnosis Date Noted   Bariatric surgery status-status post Capella procedure 01/11/2021   Weight gain following gastric bypass surgery 01/11/2021   Lyme disease 05/07/2020   H/O right hemicolectomy-for inflammatory mass unclear etiology 2003 04/01/2020   Abdominal pain, chronic, right lower quadrant 04/01/2020   Lumbar spinal stenosis 12/15/2019   Leg cramps 12/15/2019   Paresthesia 06/13/2018   Lumbar radiculopathy 06/13/2018   Abdominal pain 03/07/2018   Nausea and vomiting 12/04/2017   Chronic constipation 08/29/2017   History of diverticulitis 08/29/2017   GERD (gastroesophageal reflux disease) 08/29/2017   Chronic low back pain 06/15/2017   Heart palpitations 08/24/2016   Chest pain, pleuritic 11/18/2015   Thrombophilia (HCC) 09/23/2015   Lung infiltrate 09/23/2014   Ex-smoker 09/02/2014   Reactive airway disease 09/02/2014   Shortness of breath 09/02/2014   History of pneumonia 08/31/2014   History of pulmonary embolism 08/31/2014   Anemia due to blood loss, acute 06/25/2014   Neuropathic pain of lower extremity 12/21/2013   Disc herniation 11/05/2012   Past Medical History:  Diagnosis Date   Allergy to alpha-gal    Angio-edema    Anxiety    Asthma    childhood asthma   Colonic mass  In [place], found inconclusive   Eczema    Environmental allergies    trees, grass, oak trees   GERD (gastroesophageal reflux disease)    Leg cramps 12/15/2019   Lumbar spinal stenosis 12/15/2019   L2-3 level   Lyme disease    Neuropathy    Numbness    Pulmonary embolism (HCC)    Seasonal allergies     Family History  Problem Relation Age of Onset   Colon cancer Maternal Aunt    Crohn's disease Grandchild    Crohn's disease Son    Cancer Mother        kidney or liver   Heart failure Father    Heart attack Father    Pancreatic cancer Sister    Stroke  Maternal Grandmother    Allergic rhinitis Neg Hx    Asthma Neg Hx    Eczema Neg Hx    Urticaria Neg Hx    Esophageal cancer Neg Hx    Stomach cancer Neg Hx     Past Surgical History:  Procedure Laterality Date   APPENDECTOMY     COLONOSCOPY  2018   [place]: diverticulosis   ESOPHAGOGASTRODUODENOSCOPY N/A 01/09/2018   normal esophagus, normal residual stomach status post gastric obesity procedure.   GASTRIC BYPASS  2003   LUMBAR FUSION  2016   PARTIAL HYSTERECTOMY  1980   TONSILLECTOMY     WRIST FRACTURE SURGERY  2013   Social History   Occupational History   Occupation: Retired from trucking  Tobacco Use   Smoking status: Former    Types: Cigarettes    Quit date: 05/29/1997    Years since quitting: 23.6   Smokeless tobacco: Never  Vaping Use   Vaping Use: Never used  Substance and Sexual Activity   Alcohol use: Yes    Comment: occasional   Drug use: Never   Sexual activity: Not on file

## 2021-01-11 NOTE — Patient Instructions (Addendum)
If you are age 69 or older, your body mass index should be between 23-30. Your Body mass index is 31.82 kg/m. If this is out of the aforementioned range listed, please consider follow up with your Primary Care Provider.  If you are age 82 or younger, your body mass index should be between 19-25. Your Body mass index is 31.82 kg/m. If this is out of the aformentioned range listed, please consider follow up with your Primary Care Provider.   __________________________________________________________  The [facility] providers would like to encourage you to use [facility] to communicate with providers for non-urgent requests or questions.  Due to long hold times on the telephone, sending your provider a message by [facility] may be a faster and more efficient way to get a response.  Please allow 48 business hours for a response.  Please remember that this is for non-urgent requests.   Please look up [email or web address] and go to the section for post weight loss surgery advice.  Keep a strict food diary of what you eat and drink.  You have been scheduled for an endoscopy. Please follow written instructions given to you at your visit today. If you use inhalers (even only as needed), please bring them with you on the day of your procedure.  Due to recent changes in healthcare laws, you may see the results of your imaging and laboratory studies on MyChart before your provider has had a chance to review them.  We understand that in some cases there may be results that are confusing or concerning to you. Not all laboratory results come back in the same time frame and the provider may be waiting for multiple results in order to interpret others.  Please give [place] 48 hours in order for your provider to thoroughly review all the results before contacting the office for clarification of your results.   I appreciate the opportunity to care for you. Stan Head, MD, [facility]

## 2021-01-13 ENCOUNTER — Telehealth: Payer: Self-pay | Admitting: Family Medicine

## 2021-01-13 LAB — VITAMIN B12: Vitamin B-12: 270 pg/mL (ref 200–1100)

## 2021-01-13 LAB — SEDIMENTATION RATE: Sed Rate: 34 mm/h — ABNORMAL HIGH (ref 0–30)

## 2021-01-13 LAB — THYROID PANEL WITH TSH
Free Thyroxine Index: 2 (ref 1.4–3.8)
T3 Uptake: 27 % (ref 22–35)
T4, Total: 7.3 ug/dL (ref 5.1–11.9)
TSH: 1.56 mIU/L (ref 0.40–4.50)

## 2021-01-13 LAB — CBC WITH DIFFERENTIAL/PLATELET
Absolute Monocytes: 410 cells/uL (ref 200–950)
Basophils Absolute: 29 cells/uL (ref 0–200)
Basophils Relative: 0.5 %
Eosinophils Absolute: 131 cells/uL (ref 15–500)
Eosinophils Relative: 2.3 %
HCT: 38.1 % (ref 35.0–45.0)
Hemoglobin: 12.8 g/dL (ref 11.7–15.5)
Lymphs Abs: 2058 cells/uL (ref 850–3900)
MCH: 30 pg (ref 27.0–33.0)
MCHC: 33.6 g/dL (ref 32.0–36.0)
MCV: 89.4 fL (ref 80.0–100.0)
MPV: 10.6 fL (ref 7.5–12.5)
Monocytes Relative: 7.2 %
Neutro Abs: 3072 cells/uL (ref 1500–7800)
Neutrophils Relative %: 53.9 %
Platelets: 225 10*3/uL (ref 140–400)
RBC: 4.26 10*6/uL (ref 3.80–5.10)
RDW: 12.9 % (ref 11.0–15.0)
Total Lymphocyte: 36.1 %
WBC: 5.7 10*3/uL (ref 3.8–10.8)

## 2021-01-13 LAB — COMPREHENSIVE METABOLIC PANEL
AG Ratio: 1.7 (calc) (ref 1.0–2.5)
ALT: 10 U/L (ref 6–29)
AST: 11 U/L (ref 10–35)
Albumin: 4.3 g/dL (ref 3.6–5.1)
Alkaline phosphatase (APISO): 134 U/L (ref 37–153)
BUN: 19 mg/dL (ref 7–25)
CO2: 30 mmol/L (ref 20–32)
Calcium: 9.5 mg/dL (ref 8.6–10.4)
Chloride: 103 mmol/L (ref 98–110)
Creat: 0.74 mg/dL (ref 0.50–1.05)
Globulin: 2.6 g/dL (calc) (ref 1.9–3.7)
Glucose, Bld: 90 mg/dL (ref 65–99)
Potassium: 4.1 mmol/L (ref 3.5–5.3)
Sodium: 139 mmol/L (ref 135–146)
Total Bilirubin: 0.4 mg/dL (ref 0.2–1.2)
Total Protein: 6.9 g/dL (ref 6.1–8.1)

## 2021-01-13 LAB — C-REACTIVE PROTEIN: CRP: 12.8 mg/L — ABNORMAL HIGH (ref <8.0)

## 2021-01-13 LAB — ANA: Anti Nuclear Antibody (ANA): NEGATIVE

## 2021-01-13 LAB — VITAMIN D 25 HYDROXY (VIT D DEFICIENCY, FRACTURES): Vit D, 25-Hydroxy: 41 ng/mL (ref 30–100)

## 2021-01-13 MED ORDER — CYANOCOBALAMIN 1000 MCG/ML IJ SOLN: 1000.0000 ug | INTRAMUSCULAR | 3 refills | Status: DC

## 2021-01-13 NOTE — Telephone Encounter (Signed)
B12 level is low.  I suggest trying once weekly dosing.  Vitamin D level is adequate.  Start the once weekly dosing and have the levels rechecked in 4 to 6 months to be sure it is still good.  CRP and sed rate are slightly elevated, indicating inflammation of some sort.  It is a nonspecific test.  Other labs were normal.  Might want to go to [facility] in [place] for alternative treatments of Lyme if symptoms persist.

## 2021-01-13 NOTE — Addendum Note (Signed)
Addended by: Lillia Carmel on: 01/13/2021 10:14 AM   Modules accepted: Orders

## 2021-01-18 ENCOUNTER — Telehealth: Payer: Self-pay | Admitting: Family Medicine

## 2021-01-18 NOTE — Telephone Encounter (Signed)
Patient called. She would like Terri to call her. Has some questions about her test results. Her call back number is [phone]

## 2021-01-19 DIAGNOSIS — R350 Frequency of micturition: Secondary | ICD-10-CM | POA: Diagnosis not present

## 2021-01-19 DIAGNOSIS — J3081 Allergic rhinitis due to animal (cat) (dog) hair and dander: Secondary | ICD-10-CM | POA: Diagnosis not present

## 2021-01-19 DIAGNOSIS — J3089 Other allergic rhinitis: Secondary | ICD-10-CM | POA: Diagnosis not present

## 2021-01-19 DIAGNOSIS — J301 Allergic rhinitis due to pollen: Secondary | ICD-10-CM | POA: Diagnosis not present

## 2021-01-19 MED ORDER — CYANOCOBALAMIN 1000 MCG/ML IJ SOLN
1000.0000 ug | INTRAMUSCULAR | 3 refills | Status: DC
Start: 2021-01-19 — End: 2021-11-16

## 2021-01-19 NOTE — Addendum Note (Signed)
Addended by: Lillia Carmel on: 01/19/2021 09:21 AM   Modules accepted: Orders

## 2021-01-19 NOTE — Telephone Encounter (Signed)
The patient corresponded directly with Dr. Prince Rome through MyChart today.

## 2021-01-19 NOTE — Telephone Encounter (Signed)
I called and left voice message to call back or send a message through MyChart.

## 2021-01-21 ENCOUNTER — Encounter: Payer: Self-pay | Admitting: Internal Medicine

## 2021-01-21 ENCOUNTER — Other Ambulatory Visit: Payer: Self-pay

## 2021-01-21 ENCOUNTER — Ambulatory Visit (AMBULATORY_SURGERY_CENTER): Payer: Medicare Other | Admitting: Internal Medicine

## 2021-01-21 VITALS — BP 109/58 | HR 72 | Temp 97.5°F | Resp 18 | Ht 66.0 in | Wt 200.0 lb

## 2021-01-21 DIAGNOSIS — R1033 Periumbilical pain: Secondary | ICD-10-CM

## 2021-01-21 DIAGNOSIS — R1110 Vomiting, unspecified: Secondary | ICD-10-CM | POA: Diagnosis not present

## 2021-01-21 DIAGNOSIS — R1115 Cyclical vomiting syndrome unrelated to migraine: Secondary | ICD-10-CM | POA: Diagnosis not present

## 2021-01-21 MED ORDER — SODIUM CHLORIDE 0.9 % IV SOLN: 500.0000 mL | Freq: Once | INTRAVENOUS | Status: DC

## 2021-01-21 NOTE — Progress Notes (Signed)
History and Physical Interval Note:  01/21/2021 9:53 AM  Patricia Eaton  has presented today for endoscopic procedure(s), with the diagnosis of  Encounter Diagnoses  Name Primary?   Periumbilical pain Yes   Persistent vomiting   .  The various methods of evaluation and treatment have been discussed with the patient and/or family. After consideration of risks, benefits and other options for treatment, the patient has consented to  the endoscopic procedure(s).   The patient's history has been reviewed, patient examined, no change in status, stable for endoscopic procedure(s).  I have reviewed the patient's chart and labs.  Questions were answered to the patient's satisfaction.     Iva Boop, MD, [facility]

## 2021-01-21 NOTE — Progress Notes (Signed)
Sedate, gd SR's, VSS, report to RN 

## 2021-01-21 NOTE — Op Note (Signed)
[facility] Patient Name: Patricia Eaton Procedure Date: 01/21/2021 9:45 AM MRN: [id number] Endoscopist: Iva Boop , MD Age: 70 Referring MD:  Date of Birth: 02/11/1951 Gender: Female Account #: [id number] Procedure:                Upper GI endoscopy Indications:              Periumbilical abdominal pain, Persistent vomiting Medicines:                Propofol per Anesthesia, Monitored Anesthesia Care Procedure:                Pre-Anesthesia Assessment:                           - Prior to the procedure, a History and Physical                            was performed, and patient medications and                            allergies were reviewed. The patient's tolerance of                            previous anesthesia was also reviewed. The risks                            and benefits of the procedure and the sedation                            options and risks were discussed with the patient.                            All questions were answered, and informed consent                            was obtained. Prior Anticoagulants: The patient has                            taken no previous anticoagulant or antiplatelet                            agents. ASA Grade Assessment: II - A patient with                            mild systemic disease. After reviewing the risks                            and benefits, the patient was deemed in                            satisfactory condition to undergo the procedure.                           After obtaining informed consent, the endoscope was  passed under direct vision. Throughout the                            procedure, the patient's blood pressure, pulse, and                            oxygen saturations were monitored continuously. The                            Endoscope was introduced through the mouth, and                            advanced to the proximal jejunum. The upper GI                             endoscopy was accomplished without difficulty. The                            patient tolerated the procedure well. Scope In: Scope Out: Findings:                 The examined esophagus was normal. 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 This was characterized by healthy                            appearing mucosa. 35-50 cm length                           The examined duodenum was normal.                           The examined jejunum was normal.                           The sleeve was normal on retroflexion. Complications:            No immediate complications. Estimated Blood Loss:     Estimated blood loss: none. Impression:               - Normal esophagus.                           - A sleeve gastrectomy was found, characterized by                            healthy appearing mucosa.                           - Normal examined duodenum.                           - Normal examined  jejunum.                           - No specimens collected. Recommendation:           - Patient has a contact number available for                            emergencies. The signs and symptoms of potential                            delayed complications were discussed with the                            patient. Return to normal activities tomorrow.                            Written discharge instructions were provided to the                            patient.                           - Resume previous diet.                           - Continue present medications.                           - OFFICE WILL SCHEDULE CT-ENTEROGRAOHY RE:                            PERIUNBILICAL ABDOMINAL PAIN, VOMITING DIARRHEA Iva Boop, MD 01/21/2021 10:27:27 AM This report has been signed electronically.

## 2021-01-21 NOTE — Progress Notes (Signed)
Pt's states no medical or surgical changes since previsit or office visit. VS assessed by C.W 

## 2021-01-21 NOTE — Patient Instructions (Addendum)
Things look the way they should after surgery.  I am going to have you do a CT scan.  I appreciate the opportunity to care for you. Iva Boop, MD, [facility]  The office will schedule your CT.  They will call you.  Read all of the handouts given to you by your recovery room nurse.   YOU HAD AN ENDOSCOPIC PROCEDURE TODAY AT THE  ENDOSCOPY CENTER:   Refer to the procedure report that was given to you for any specific questions about what was found during the examination.  If the procedure report does not answer your questions, please call your gastroenterologist to clarify.  If you requested that your care partner not be given the details of your procedure findings, then the procedure report has been included in a sealed envelope for you to review at your convenience later.  YOU SHOULD EXPECT: Some feelings of bloating in the abdomen. Passage of more gas than usual.  Walking can help get rid of the air that was put into your GI tract during the procedure and reduce the bloating.   Please Note:  You might notice some irritation and congestion in your nose or some drainage.  This is from the oxygen used during your procedure.  There is no need for concern and it should clear up in a day or so.  SYMPTOMS TO REPORT IMMEDIATELY:   Following upper endoscopy (EGD)  Vomiting of blood or coffee ground material  New chest pain or pain under the shoulder blades  Painful or persistently difficult swallowing  New shortness of breath  Fever of 100F or higher  Black, tarry-looking stools  For urgent or emergent issues, a gastroenterologist can be reached at any hour by calling [phone]. Do not use MyChart messaging for urgent concerns.    DIET:  We do recommend a small meal at first, but then you may proceed to your regular diet.  Drink plenty of fluids but you should avoid alcoholic beverages for 24 hours.  ACTIVITY:  You should plan to take it easy for the rest of today and you should  NOT DRIVE or use heavy machinery until tomorrow (because of the sedation medicines used during the test).    FOLLOW UP: Our staff will call the number listed on your records 48-72 hours following your procedure to check on you and address any questions or concerns that you may have regarding the information given to you following your procedure. If we do not reach you, we will leave a message.  We will attempt to reach you two times.  During this call, we will ask if you have developed any symptoms of COVID 19. If you develop any symptoms (ie: fever, flu-like symptoms, shortness of breath, cough etc.) before then, please call [phone].  If you test positive for Covid 19 in the 2 weeks post procedure, please call and report this information to [place].      SIGNATURES/CONFIDENTIALITY: You and/or your care partner have signed paperwork which will be entered into your electronic medical record.  These signatures attest to the fact that that the information above on your After Visit Summary has been reviewed and is understood.  Full responsibility of the confidentiality of this discharge information lies with you and/or your care-partner.

## 2021-01-25 ENCOUNTER — Telehealth: Payer: Self-pay

## 2021-01-25 DIAGNOSIS — G8929 Other chronic pain: Secondary | ICD-10-CM

## 2021-01-25 DIAGNOSIS — R1115 Cyclical vomiting syndrome unrelated to migraine: Secondary | ICD-10-CM

## 2021-01-25 DIAGNOSIS — R197 Diarrhea, unspecified: Secondary | ICD-10-CM

## 2021-01-25 DIAGNOSIS — R1033 Periumbilical pain: Secondary | ICD-10-CM

## 2021-01-25 DIAGNOSIS — R1031 Right lower quadrant pain: Secondary | ICD-10-CM

## 2021-01-25 NOTE — Telephone Encounter (Signed)
  Follow up Call-  Call back number 01/21/2021  Post procedure Call Back phone  # [phone]  Permission to leave phone message Yes  Some recent data might be hidden     Patient questions:  Do you have a fever, pain , or abdominal swelling? No. Pain Score  0 *  Have you tolerated food without any problems? Yes.    Have you been able to return to your normal activities? Yes.    Do you have any questions about your discharge instructions: Diet   No. Medications  No. Follow up visit  No.  Do you have questions or concerns about your Care? No.  Actions: * If pain score is 4 or above: No action needed, pain <4.

## 2021-01-25 NOTE — Telephone Encounter (Signed)
Patient is notified she will be contacted directly to arrange CT enterography per Dr. Leone Payor for periumbilical pain, vomiting, and diarrhea

## 2021-01-27 DIAGNOSIS — J3081 Allergic rhinitis due to animal (cat) (dog) hair and dander: Secondary | ICD-10-CM | POA: Diagnosis not present

## 2021-01-27 DIAGNOSIS — J3089 Other allergic rhinitis: Secondary | ICD-10-CM | POA: Diagnosis not present

## 2021-01-27 DIAGNOSIS — J301 Allergic rhinitis due to pollen: Secondary | ICD-10-CM | POA: Diagnosis not present

## 2021-01-28 ENCOUNTER — Other Ambulatory Visit: Payer: Self-pay

## 2021-01-28 ENCOUNTER — Encounter (HOSPITAL_COMMUNITY): Payer: Self-pay

## 2021-01-28 ENCOUNTER — Emergency Department (HOSPITAL_COMMUNITY)
Admission: EM | Admit: 2021-01-28 | Discharge: 2021-01-28 | Disposition: A | Discharge status: Home / Self Care | Payer: Medicare Other | Attending: Emergency Medicine | Admitting: Emergency Medicine

## 2021-01-28 DIAGNOSIS — W540XXA Bitten by dog, initial encounter: Secondary | ICD-10-CM | POA: Diagnosis not present

## 2021-01-28 DIAGNOSIS — Z7982 Long term (current) use of aspirin: Secondary | ICD-10-CM | POA: Insufficient documentation

## 2021-01-28 DIAGNOSIS — S0185XA Open bite of other part of head, initial encounter: Secondary | ICD-10-CM

## 2021-01-28 DIAGNOSIS — J45909 Unspecified asthma, uncomplicated: Secondary | ICD-10-CM | POA: Insufficient documentation

## 2021-01-28 DIAGNOSIS — Z87891 Personal history of nicotine dependence: Secondary | ICD-10-CM | POA: Diagnosis not present

## 2021-01-28 DIAGNOSIS — Z23 Encounter for immunization: Secondary | ICD-10-CM | POA: Diagnosis not present

## 2021-01-28 DIAGNOSIS — S01551A Open bite of lip, initial encounter: Secondary | ICD-10-CM | POA: Diagnosis not present

## 2021-01-28 DIAGNOSIS — S01511A Laceration without foreign body of lip, initial encounter: Secondary | ICD-10-CM | POA: Diagnosis not present

## 2021-01-28 MED ORDER — TETANUS-DIPHTH-ACELL PERTUSSIS 5-2.5-18.5 LF-MCG/0.5 IM SUSY
0.5000 mL | PREFILLED_SYRINGE | Freq: Once | INTRAMUSCULAR | Status: AC
  Administered 2021-01-28: 0.5 mL via INTRAMUSCULAR
  Filled 2021-01-28: qty 0.5

## 2021-01-28 MED ORDER — AMOXICILLIN-POT CLAVULANATE 875-125 MG PO TABS: 1.0000 | ORAL_TABLET | Freq: Two times a day (BID) | ORAL | 0 refills | Status: DC

## 2021-01-28 NOTE — ED Triage Notes (Signed)
pt was bit by her dog this morning, small laceration noted to her upper lip, no bleeding at this time. Sent by UC for further eval. Dog OTD on rabies.

## 2021-01-28 NOTE — Discharge Instructions (Addendum)
Take the Augmentin as directed.  Quarantine your animal.  Follow-up if animal starts to act sick.  Tetanus updated today.  Wound care to the upper lip with antibiotic ointment.  It will heal in fine on its own.

## 2021-01-28 NOTE — ED Provider Notes (Signed)
[facility] EMERGENCY DEPARTMENT Provider Note   CSN: [id number] Arrival date & time: 01/28/21  0954     History Chief Complaint  Patient presents with   Animal Bite    Patricia Eaton is a 70 y.o. female.  Patient bit on the upper lip by her dog this morning.  Went to urgent care and was referred in for possible suturing.  Animal's rabies are up-to-date.  It is her animal should be out of quarantine the animal at home.  Animal not acting sick.  Patient's tetanus not up-to-date.      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   Weight gain following gastric bypass surgery 01/11/2021   Lyme disease 05/07/2020   H/O right hemicolectomy-for inflammatory mass unclear etiology 2003 04/01/2020   Abdominal pain, chronic, right lower quadrant 04/01/2020   Lumbar spinal stenosis 12/15/2019   Leg cramps 12/15/2019   Paresthesia 06/13/2018   Lumbar radiculopathy 06/13/2018   Abdominal pain 03/07/2018   Nausea and vomiting 12/04/2017   Chronic constipation 08/29/2017   History of diverticulitis 08/29/2017   GERD (gastroesophageal reflux disease) 08/29/2017   Chronic low back pain 06/15/2017   Heart palpitations 08/24/2016   Chest pain, pleuritic 11/18/2015   Thrombophilia (HCC) 09/23/2015   Lung infiltrate 09/23/2014   Ex-smoker 09/02/2014   Reactive airway disease 09/02/2014   Shortness of breath 09/02/2014   History of pneumonia  08/31/2014   History of pulmonary embolism 08/31/2014   Anemia due to blood loss, acute 06/25/2014   Neuropathic pain of lower extremity 12/21/2013   Disc herniation 11/05/2012    Past Surgical History:  Procedure Laterality Date   APPENDECTOMY     COLONOSCOPY  2018   [place]: diverticulosis   ESOPHAGOGASTRODUODENOSCOPY N/A 01/09/2018   normal esophagus, normal residual stomach status post gastric obesity procedure.   GASTRIC BYPASS  2003   LUMBAR FUSION  2016   PARTIAL HYSTERECTOMY  1980   TONSILLECTOMY     WRIST FRACTURE SURGERY  2013     OB History   No obstetric history on file.     Family History  Problem Relation Age of Onset   Colon cancer Maternal Aunt    Crohn's disease Grandchild    Crohn's disease Son    Cancer Mother        kidney or liver   Heart failure Father    Heart attack Father    Pancreatic cancer Sister    Stroke Maternal Grandmother    Allergic rhinitis Neg Hx    Asthma Neg Hx    Eczema Neg Hx    Urticaria Neg Hx    Esophageal cancer Neg Hx    Stomach cancer Neg Hx     Social History   Tobacco Use   Smoking status: Former    Types: Cigarettes  Quit date: 05/29/1997    Years since quitting: 23.6   Smokeless tobacco: Never  Vaping Use   Vaping Use: Never used  Substance Use Topics   Alcohol use: Yes    Comment: occasional   Drug use: Never    Home Medications Prior to Admission medications   Medication Sig Start Date End Date Taking? Authorizing Provider  amoxicillin-clavulanate (AUGMENTIN) 875-125 MG tablet Take 1 tablet by mouth every 12 (twelve) hours. 01/28/21  Yes Vanetta Mulders, MD  Ascorbic Acid (VITAMIN C WITH ROSE HIPS) 1000 MG tablet Take 1,000 mg by mouth daily.    [provider]  aspirin EC 81 MG tablet Take 81 mg by mouth daily.    [provider]  atorvastatin (LIPITOR) 10 MG tablet Take 1 tablet (10 mg total) by mouth daily. 09/08/20 01/21/21  Wendall Stade, MD  CALCIUM PO Take 500 mg by mouth  daily.     [provider]  cholecalciferol (VITAMIN D) 1000 units tablet Take 5,000 Units by mouth daily.     [provider]  CRANBERRY PO Take 1 tablet by mouth daily.    [provider]  cyanocobalamin (,VITAMIN B-12,) 1000 MCG/ML injection Inject 1 mL (1,000 mcg total) into the skin once a week. 01/19/21   [name], Casimiro Needle, MD  EPINEPHrine 0.3 mg/0.3 mL IJ SOAJ injection See admin instructions. Patient not taking: No sig reported    [provider]  ergocalciferol (VITAMIN D2) 1.25 MG (50000 UT) capsule Take 1 capsule (50,000 Units total) by mouth once a week. Patient not taking: Reported on 01/21/2021 01/11/21   [name], Casimiro Needle, MD  ferrous sulfate 325 (65 FE) MG tablet Take 325 mg by mouth daily with breakfast.    [provider]  fluticasone (FLONASE) 50 MCG/ACT nasal spray Place 2 sprays into both nostrils daily. Patient not taking: Reported on 01/21/2021 06/20/18   [provider]  hydrochlorothiazide (MICROZIDE) 12.5 MG capsule Take 1 capsule (12.5 mg total) by mouth daily as needed (for edema). Patient not taking: Reported on 01/21/2021 02/24/20   Wendall Stade, MD  MAGNESIUM PO Take 1 tablet by mouth daily.    [provider]  melatonin 3 MG TABS tablet Take 6 mg by mouth at bedtime.    [provider]  Misc Natural Products (NEURIVA) CAPS See admin instructions.    [provider]  Olopatadine HCl 0.2 % SOLN 1 drop into affected eye Patient not taking: Reported on 01/21/2021 10/27/14   [provider]  omeprazole (PRILOSEC) 40 MG capsule Take 40 mg by mouth daily. 03/23/20   [provider]  polyethylene glycol powder (GLYCOLAX/MIRALAX) 17 GM/SCOOP powder Take 17 g by mouth daily. Patient not taking: Reported on 01/21/2021 04/01/20   Iva Boop, MD  pregabalin (LYRICA) 100 MG capsule Take 100mg  in the morning and 200mg  at night    [provider]  PRESCRIPTION MEDICATION Allergy  shot three times weekly    [provider]  tiZANidine (ZANAFLEX) 4 MG tablet Take 1 tablet by mouth daily as needed. Patient not taking: Reported on 01/21/2021 07/11/18   [provider]  topiramate (TOPAMAX) 25 MG tablet Take 25 mg by mouth daily. Patient not taking: No sig reported 12/22/20   [provider]  trimethoprim (TRIMPEX) 100 MG tablet Take 100 mg by mouth daily. 07/05/18   [provider]  zinc gluconate 50 MG tablet Take 50 mg by mouth daily.    [provider]  Allergies    Molds & smuts and Other  Review of Systems   Review of Systems  Constitutional:  Negative for chills and fever.  HENT:  Negative for ear pain and sore throat.   Eyes:  Negative for pain and visual disturbance.  Respiratory:  Negative for cough and shortness of breath.   Cardiovascular:  Negative for chest pain and palpitations.  Gastrointestinal:  Negative for abdominal pain and vomiting.  Genitourinary:  Negative for dysuria and hematuria.  Musculoskeletal:  Negative for arthralgias and back pain.  Skin:  Positive for wound. Negative for color change and rash.  Neurological:  Negative for seizures and syncope.  All other systems reviewed and are negative.  Physical Exam Updated Vital Signs BP (!) 138/110   Pulse 74   Temp 98.3 F (36.8 C) (Oral)   Resp 14   Ht 1.702 m (5\' 7" )   Wt 90.7 kg   SpO2 99%   BMI 31.32 kg/m   Physical Exam Vitals and nursing note reviewed.  Constitutional:      General: She is not in acute distress.    Appearance: Normal appearance. She is well-developed.  HENT:     Head: Normocephalic.     Comments: Patient with about a 4 mm missing skin upper lip across the frenulum border.  And then some superficial abrasions lacerations following the dog bite.  Facial muscle intact.  No other injuries.    Mouth/Throat:     Pharynx: Oropharynx is clear.  Eyes:     Extraocular Movements: Extraocular movements intact.      Conjunctiva/sclera: Conjunctivae normal.     Pupils: Pupils are equal, round, and reactive to light.  Cardiovascular:     Rate and Rhythm: Normal rate and regular rhythm.     Heart sounds: No murmur heard. Pulmonary:     Effort: Pulmonary effort is normal. No respiratory distress.     Breath sounds: Normal breath sounds.  Abdominal:     Palpations: Abdomen is soft.     Tenderness: There is no abdominal tenderness.  Musculoskeletal:     Cervical back: Neck supple.  Skin:    General: Skin is warm and dry.  Neurological:     General: No focal deficit present.     Mental Status: She is alert and oriented to person, place, and time.     Cranial Nerves: Cranial nerve deficit present.    ED Results / Procedures / Treatments   Labs (all labs ordered are listed, but only abnormal results are displayed) Labs Reviewed - No data to display  EKG None  Radiology No results found.  Procedures Procedures   Medications Ordered in ED Medications  Tdap (BOOSTRIX) injection 0.5 mL (0.5 mLs Intramuscular Given 01/28/21 1043)    ED Course  I have reviewed the triage vital signs and the nursing notes.  Pertinent labs & imaging results that were available during my care of the patient were reviewed by me and considered in my medical decision making (see chart for details).    MDM Rules/Calculators/A&P                           The wound to the upper lip back she has missing skin.  If you try to pull together it causes deformity.  So will allow it to heal by itself.  Wound care antibiotic ointment as needed.  Tetanus will be updated.  Animal's rabies is up-to-date.  Patient seen at  urgent care prior to coming here.  Patient will quarantine the animal.  If he starts to act sick she will get treatment.  Patient will be started on Augmentin.   Final Clinical Impression(s) / ED Diagnoses Final diagnoses:  Dog bite of face, initial encounter    Rx / DC Orders ED Discharge Orders           Ordered    amoxicillin-clavulanate (AUGMENTIN) 875-125 MG tablet  Every 12 hours        01/28/21 1058             Vanetta MuldersZackowski, Karena Kinker, MD 01/28/21 1058

## 2021-02-02 DIAGNOSIS — J3089 Other allergic rhinitis: Secondary | ICD-10-CM | POA: Diagnosis not present

## 2021-02-02 DIAGNOSIS — Z961 Presence of intraocular lens: Secondary | ICD-10-CM | POA: Diagnosis not present

## 2021-02-02 DIAGNOSIS — H0288B Meibomian gland dysfunction left eye, upper and lower eyelids: Secondary | ICD-10-CM | POA: Diagnosis not present

## 2021-02-02 DIAGNOSIS — J3081 Allergic rhinitis due to animal (cat) (dog) hair and dander: Secondary | ICD-10-CM | POA: Diagnosis not present

## 2021-02-02 DIAGNOSIS — J301 Allergic rhinitis due to pollen: Secondary | ICD-10-CM | POA: Diagnosis not present

## 2021-02-02 DIAGNOSIS — H0288A Meibomian gland dysfunction right eye, upper and lower eyelids: Secondary | ICD-10-CM | POA: Diagnosis not present

## 2021-02-10 ENCOUNTER — Ambulatory Visit (HOSPITAL_COMMUNITY): Payer: Medicare Other

## 2021-02-11 DIAGNOSIS — N390 Urinary tract infection, site not specified: Secondary | ICD-10-CM | POA: Diagnosis not present

## 2021-02-15 DIAGNOSIS — K5730 Diverticulosis of large intestine without perforation or abscess without bleeding: Secondary | ICD-10-CM | POA: Diagnosis not present

## 2021-02-15 DIAGNOSIS — K449 Diaphragmatic hernia without obstruction or gangrene: Secondary | ICD-10-CM | POA: Diagnosis not present

## 2021-02-15 DIAGNOSIS — R8271 Bacteriuria: Secondary | ICD-10-CM | POA: Diagnosis not present

## 2021-02-15 DIAGNOSIS — K7689 Other specified diseases of liver: Secondary | ICD-10-CM | POA: Diagnosis not present

## 2021-02-15 DIAGNOSIS — N3020 Other chronic cystitis without hematuria: Secondary | ICD-10-CM | POA: Diagnosis not present

## 2021-02-15 DIAGNOSIS — I700 Atherosclerosis of aorta: Secondary | ICD-10-CM | POA: Diagnosis not present

## 2021-02-17 ENCOUNTER — Ambulatory Visit (HOSPITAL_COMMUNITY): Payer: Medicare Other

## 2021-02-17 DIAGNOSIS — J3089 Other allergic rhinitis: Secondary | ICD-10-CM | POA: Diagnosis not present

## 2021-02-17 DIAGNOSIS — J3081 Allergic rhinitis due to animal (cat) (dog) hair and dander: Secondary | ICD-10-CM | POA: Diagnosis not present

## 2021-02-17 DIAGNOSIS — J301 Allergic rhinitis due to pollen: Secondary | ICD-10-CM | POA: Diagnosis not present

## 2021-02-21 ENCOUNTER — Telehealth: Payer: Self-pay | Admitting: Internal Medicine

## 2021-02-21 DIAGNOSIS — J3089 Other allergic rhinitis: Secondary | ICD-10-CM | POA: Diagnosis not present

## 2021-02-21 DIAGNOSIS — J3081 Allergic rhinitis due to animal (cat) (dog) hair and dander: Secondary | ICD-10-CM | POA: Diagnosis not present

## 2021-02-21 DIAGNOSIS — J301 Allergic rhinitis due to pollen: Secondary | ICD-10-CM | POA: Diagnosis not present

## 2021-02-21 NOTE — Telephone Encounter (Signed)
Patient is on antibiotics for UTI.  She was not able to drink the contrast for enterography.  She is advised to complete the antibiotics, then reschedule the enterography.

## 2021-02-21 NOTE — Telephone Encounter (Signed)
Inbound call from patient requesting a call from a nurse please.  Was not able to go for the CT scan and is wanting to discuss other options.

## 2021-03-04 ENCOUNTER — Ambulatory Visit (INDEPENDENT_AMBULATORY_CARE_PROVIDER_SITE_OTHER): Payer: Medicare Other | Admitting: Family Medicine

## 2021-03-04 ENCOUNTER — Encounter (HOSPITAL_BASED_OUTPATIENT_CLINIC_OR_DEPARTMENT_OTHER): Payer: Self-pay | Admitting: Family Medicine

## 2021-03-04 ENCOUNTER — Other Ambulatory Visit: Payer: Self-pay

## 2021-03-04 DIAGNOSIS — E538 Deficiency of other specified B group vitamins: Secondary | ICD-10-CM

## 2021-03-04 DIAGNOSIS — E785 Hyperlipidemia, unspecified: Secondary | ICD-10-CM | POA: Diagnosis not present

## 2021-03-04 DIAGNOSIS — E559 Vitamin D deficiency, unspecified: Secondary | ICD-10-CM | POA: Diagnosis not present

## 2021-03-04 DIAGNOSIS — J3081 Allergic rhinitis due to animal (cat) (dog) hair and dander: Secondary | ICD-10-CM | POA: Diagnosis not present

## 2021-03-04 DIAGNOSIS — J301 Allergic rhinitis due to pollen: Secondary | ICD-10-CM | POA: Diagnosis not present

## 2021-03-04 DIAGNOSIS — J3089 Other allergic rhinitis: Secondary | ICD-10-CM | POA: Diagnosis not present

## 2021-03-04 NOTE — Assessment & Plan Note (Signed)
Continue with Lipitor Continue follow-up with cardiology as scheduled

## 2021-03-04 NOTE — Progress Notes (Signed)
New Patient Office Visit  Subjective:  Patient ID: Patricia Eaton, female    DOB: 1951/04/17  Age: 70 y.o. MRN: [id number]  CC:  Chief Complaint  Patient presents with   Establish Care    Patient states she was diagnosed with Lymes disease.    HPI [name] is a 70 year old female presenting to establish in clinic.  No specific concerns today.  Reports past medical history of Lyme disease, recurrent UTIs, lumbar radiculopathy, dyslipidemia.  Lyme disease: Testing was completed due to patient's ongoing issues with fatigue.  Testing revealed positive findings related to IgG, 1 of 3 IgM testing was positive, seems most consistent with prior infection, not acute.  Lumbar radiculopathy: Has been following with neurology regarding this.  Has been on Lyrica in the past related to this.  Recurrent UTIs: Follows with urology, Dr. Jacquelyne Balint.  She has history of bariatric surgery about 15 years ago.  More recently, she developed new GI issues for which she has been undergoing evaluation.  Has had upper endoscopy performed and was scheduled to have CT enterography performed, however was having issues related to UTI and was on antibiotics and imaging was delayed.  Still needs to schedule this, but plans to be doing so soon.  Patient follow-up with cardiology due to family history of CAD.  She was found to have a coronary calcium score of 62 and was started on Lipitor which she continues with.  Past Medical History:  Diagnosis Date   Allergy to alpha-gal    Angio-edema    Anxiety    Asthma    childhood asthma   Colonic mass    In [place], found inconclusive   Eczema    Environmental allergies    trees, grass, oak trees   GERD (gastroesophageal reflux disease)    Leg cramps 12/15/2019   Lumbar spinal stenosis 12/15/2019   L2-3 level   Lyme disease    Neuropathy    Numbness    Pulmonary embolism (HCC)    Seasonal allergies     Past Surgical History:  Procedure  Laterality Date   APPENDECTOMY     COLONOSCOPY  2018   [place]: diverticulosis   ESOPHAGOGASTRODUODENOSCOPY N/A 01/09/2018   normal esophagus, normal residual stomach status post gastric obesity procedure.   GASTRIC BYPASS  2003   LUMBAR FUSION  2016   PARTIAL HYSTERECTOMY  1980   TONSILLECTOMY     WRIST FRACTURE SURGERY  2013    Family History  Problem Relation Age of Onset   Colon cancer Maternal Aunt    Crohn's disease Grandchild    Crohn's disease Son    Cancer Mother        kidney or liver   Heart failure Father    Heart attack Father    Pancreatic cancer Sister    Stroke Maternal Grandmother    Allergic rhinitis Neg Hx    Asthma Neg Hx    Eczema Neg Hx    Urticaria Neg Hx    Esophageal cancer Neg Hx    Stomach cancer Neg Hx     Social History   Socioeconomic History   Marital status: Married    Spouse name: Not on file   Number of children: 3   Years of education: 12   Highest education level: High school graduate  Occupational History   Occupation: Retired from trucking  Tobacco Use   Smoking status: Former    Types: Cigarettes    Quit date: 05/29/1997  Years since quitting: 23.7   Smokeless tobacco: Never  Vaping Use   Vaping Use: Never used  Substance and Sexual Activity   Alcohol use: Yes    Comment: occasional   Drug use: Never   Sexual activity: Not on file  Other Topics Concern   Not on file  Social History Narrative   Lives at home with husband.  Married multiple times.  3 grown sons also grandchildren and step grandchildren.   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    Social Determinants of Health   Financial Resource Strain: Not on file  Food Insecurity: Not on file  Transportation Needs: Not on file  Physical Activity: Not on file  Stress: Not on file  Social Connections: Not on file   Intimate Partner Violence: Not on file    Objective:   Today's Vitals: BP 116/61   Pulse 71   Ht 5\' 7"  (1.702 m)   Wt 201 lb 9.6 oz (91.4 kg)   SpO2 100%   BMI 31.58 kg/m   Physical Exam  70 year old female in no acute distress Cardiovascular exam regular rate and rhythm, no murmurs appreciated Lungs clear to auscultation bilaterally  Assessment & Plan:   Problem List Items Addressed This Visit       Other   Dyslipidemia    Continue with Lipitor Continue follow-up with cardiology as scheduled      Vitamin B12 deficiency    Can continue with IM administration of B12 supplement Plan to recheck vitamin B12 level at time of next office visit      Vitamin D deficiency    Reports being told by previous PCP to have level followed up on 2 reassess status Plan to recheck vitamin D at next office visit       Outpatient Encounter Medications as of 03/04/2021  Medication Sig   Ascorbic Acid (VITAMIN C WITH ROSE HIPS) 1000 MG tablet Take 1,000 mg by mouth daily.   aspirin EC 81 MG tablet Take 81 mg by mouth daily.   atorvastatin (LIPITOR) 10 MG tablet Take 1 tablet (10 mg total) by mouth daily.   CALCIUM PO Take 500 mg by mouth daily.    cholecalciferol (VITAMIN D) 1000 units tablet Take 5,000 Units by mouth daily.    CRANBERRY PO Take 1 tablet by mouth daily.   cyanocobalamin (,VITAMIN B-12,) 1000 MCG/ML injection Inject 1 mL (1,000 mcg total) into the skin once a week.   EPINEPHrine 0.3 mg/0.3 mL IJ SOAJ injection See admin instructions. (Patient not taking: No sig reported)   ergocalciferol (VITAMIN D2) 1.25 MG (50000 UT) capsule Take 1 capsule (50,000 Units total) by mouth once a week. (Patient not taking: Reported on 01/21/2021)   ferrous sulfate 325 (65 FE) MG tablet Take 325 mg by mouth daily with breakfast.   fluticasone (FLONASE) 50 MCG/ACT nasal spray Place 2 sprays into both nostrils daily. (Patient not taking: Reported on 01/21/2021)   hydrochlorothiazide  (MICROZIDE) 12.5 MG capsule Take 1 capsule (12.5 mg total) by mouth daily as needed (for edema). (Patient not taking: Reported on 01/21/2021)   MAGNESIUM PO Take 1 tablet by mouth daily.   melatonin 3 MG TABS tablet Take 6 mg by mouth at bedtime.   Misc Natural Products (NEURIVA) CAPS See admin instructions.   Olopatadine HCl 0.2 %  SOLN 1 drop into affected eye (Patient not taking: Reported on 01/21/2021)   omeprazole (PRILOSEC) 40 MG capsule Take 40 mg by mouth daily.   polyethylene glycol powder (GLYCOLAX/MIRALAX) 17 GM/SCOOP powder Take 17 g by mouth daily. (Patient not taking: Reported on 01/21/2021)   pregabalin (LYRICA) 100 MG capsule Take 100mg  in the morning and 200mg  at night   PRESCRIPTION MEDICATION Allergy shot three times weekly   tiZANidine (ZANAFLEX) 4 MG tablet Take 1 tablet by mouth daily as needed. (Patient not taking: Reported on 01/21/2021)   topiramate (TOPAMAX) 25 MG tablet Take 25 mg by mouth daily. (Patient not taking: No sig reported)   trimethoprim (TRIMPEX) 100 MG tablet Take 100 mg by mouth daily.   zinc gluconate 50 MG tablet Take 50 mg by mouth daily.   [DISCONTINUED] amoxicillin-clavulanate (AUGMENTIN) 875-125 MG tablet Take 1 tablet by mouth every 12 (twelve) hours.   No facility-administered encounter medications on file as of 03/04/2021.    Follow-up: Return in about 4 months (around 07/05/2021).  Plan for follow-up in 4 months, will have nurse visit 1 week prior to check vitamin B12 and vitamin D levels.  Review labs at office visit.  Kwamaine Cuppett J De 05/04/2021, MD

## 2021-03-04 NOTE — Assessment & Plan Note (Signed)
Reports being told by previous PCP to have level followed up on 2 reassess status Plan to recheck vitamin D at next office visit

## 2021-03-04 NOTE — Assessment & Plan Note (Signed)
Can continue with IM administration of B12 supplement Plan to recheck vitamin B12 level at time of next office visit

## 2021-03-10 DIAGNOSIS — R350 Frequency of micturition: Secondary | ICD-10-CM | POA: Diagnosis not present

## 2021-03-10 DIAGNOSIS — N3020 Other chronic cystitis without hematuria: Secondary | ICD-10-CM | POA: Diagnosis not present

## 2021-03-10 DIAGNOSIS — J3089 Other allergic rhinitis: Secondary | ICD-10-CM | POA: Diagnosis not present

## 2021-03-10 DIAGNOSIS — J3081 Allergic rhinitis due to animal (cat) (dog) hair and dander: Secondary | ICD-10-CM | POA: Diagnosis not present

## 2021-03-10 DIAGNOSIS — J301 Allergic rhinitis due to pollen: Secondary | ICD-10-CM | POA: Diagnosis not present

## 2021-03-15 DIAGNOSIS — J3081 Allergic rhinitis due to animal (cat) (dog) hair and dander: Secondary | ICD-10-CM | POA: Diagnosis not present

## 2021-03-15 DIAGNOSIS — Z91018 Allergy to other foods: Secondary | ICD-10-CM | POA: Diagnosis not present

## 2021-03-15 DIAGNOSIS — J301 Allergic rhinitis due to pollen: Secondary | ICD-10-CM | POA: Diagnosis not present

## 2021-03-15 DIAGNOSIS — J3089 Other allergic rhinitis: Secondary | ICD-10-CM | POA: Diagnosis not present

## 2021-03-28 DIAGNOSIS — J3081 Allergic rhinitis due to animal (cat) (dog) hair and dander: Secondary | ICD-10-CM | POA: Diagnosis not present

## 2021-03-28 DIAGNOSIS — J301 Allergic rhinitis due to pollen: Secondary | ICD-10-CM | POA: Diagnosis not present

## 2021-03-28 DIAGNOSIS — J3089 Other allergic rhinitis: Secondary | ICD-10-CM | POA: Diagnosis not present

## 2021-04-13 DIAGNOSIS — J301 Allergic rhinitis due to pollen: Secondary | ICD-10-CM | POA: Diagnosis not present

## 2021-04-13 DIAGNOSIS — J3081 Allergic rhinitis due to animal (cat) (dog) hair and dander: Secondary | ICD-10-CM | POA: Diagnosis not present

## 2021-04-13 DIAGNOSIS — J3089 Other allergic rhinitis: Secondary | ICD-10-CM | POA: Diagnosis not present

## 2021-04-27 DIAGNOSIS — J301 Allergic rhinitis due to pollen: Secondary | ICD-10-CM | POA: Diagnosis not present

## 2021-04-27 DIAGNOSIS — J3089 Other allergic rhinitis: Secondary | ICD-10-CM | POA: Diagnosis not present

## 2021-04-27 DIAGNOSIS — J3081 Allergic rhinitis due to animal (cat) (dog) hair and dander: Secondary | ICD-10-CM | POA: Diagnosis not present

## 2021-05-12 DIAGNOSIS — R350 Frequency of micturition: Secondary | ICD-10-CM | POA: Diagnosis not present

## 2021-05-12 DIAGNOSIS — N3020 Other chronic cystitis without hematuria: Secondary | ICD-10-CM | POA: Diagnosis not present

## 2021-05-17 ENCOUNTER — Other Ambulatory Visit: Payer: Self-pay | Admitting: Neurology

## 2021-05-17 MED ORDER — PREGABALIN 100 MG PO CAPS: 100.0000 mg | ORAL_CAPSULE | ORAL | 1 refills | Status: DC

## 2021-05-17 NOTE — Telephone Encounter (Signed)
[facility] Verified LR:01/13/2021 Qty: 270 for 90 days  Last OV:11/16/20 Pending appointment:  11/16/21

## 2021-05-17 NOTE — Telephone Encounter (Signed)
Pt requesting refill for pregabalin (LYRICA) 100 MG capsule. Pharmacy [facility] - [place], [place] - [address]

## 2021-05-19 DIAGNOSIS — J301 Allergic rhinitis due to pollen: Secondary | ICD-10-CM | POA: Diagnosis not present

## 2021-05-19 DIAGNOSIS — J3089 Other allergic rhinitis: Secondary | ICD-10-CM | POA: Diagnosis not present

## 2021-05-19 DIAGNOSIS — J3081 Allergic rhinitis due to animal (cat) (dog) hair and dander: Secondary | ICD-10-CM | POA: Diagnosis not present

## 2021-05-26 ENCOUNTER — Other Ambulatory Visit: Payer: Self-pay

## 2021-05-26 ENCOUNTER — Ambulatory Visit (HOSPITAL_COMMUNITY)
Admission: RE | Admit: 2021-05-26 | Discharge: 2021-05-26 | Disposition: A | Discharge status: Home / Self Care | Payer: Medicare Other | Source: Ambulatory Visit | Attending: Internal Medicine | Admitting: Internal Medicine

## 2021-05-26 ENCOUNTER — Encounter (HOSPITAL_COMMUNITY): Payer: Self-pay

## 2021-05-26 DIAGNOSIS — R1033 Periumbilical pain: Secondary | ICD-10-CM | POA: Insufficient documentation

## 2021-05-26 DIAGNOSIS — R197 Diarrhea, unspecified: Secondary | ICD-10-CM | POA: Diagnosis not present

## 2021-05-26 DIAGNOSIS — G8929 Other chronic pain: Secondary | ICD-10-CM | POA: Diagnosis not present

## 2021-05-26 DIAGNOSIS — R1031 Right lower quadrant pain: Secondary | ICD-10-CM | POA: Insufficient documentation

## 2021-05-26 DIAGNOSIS — R1110 Vomiting, unspecified: Secondary | ICD-10-CM | POA: Diagnosis not present

## 2021-05-26 DIAGNOSIS — R1115 Cyclical vomiting syndrome unrelated to migraine: Secondary | ICD-10-CM | POA: Diagnosis not present

## 2021-05-26 LAB — POCT I-STAT CREATININE: Creatinine, Ser: 0.7 mg/dL (ref 0.44–1.00)

## 2021-05-26 MED ORDER — SODIUM CHLORIDE (PF) 0.9 % IJ SOLN
INTRAMUSCULAR | Status: AC
  Filled 2021-05-26: qty 100

## 2021-05-26 MED ORDER — IOHEXOL 350 MG/ML SOLN
75.0000 mL | Freq: Once | INTRAVENOUS | Status: AC | PRN
  Administered 2021-05-26: 09:00:00 75 mL via INTRAVENOUS

## 2021-05-26 MED ORDER — BARIUM SULFATE 0.1 % PO SUSP: 450.0000 mL | Freq: Once | ORAL | Status: AC

## 2021-05-26 MED ORDER — BARIUM SULFATE 0.1 % PO SUSP
ORAL | Status: AC
  Administered 2021-05-26: 08:00:00 450 mL via ORAL
  Filled 2021-05-26: qty 3

## 2021-05-26 MED ORDER — BARIUM SULFATE 0.1 % PO SUSP
450.0000 mL | Freq: Once | ORAL | Status: AC
  Administered 2021-05-26: 08:00:00 450 mL via ORAL

## 2021-06-01 DIAGNOSIS — J3089 Other allergic rhinitis: Secondary | ICD-10-CM | POA: Diagnosis not present

## 2021-06-01 DIAGNOSIS — J3081 Allergic rhinitis due to animal (cat) (dog) hair and dander: Secondary | ICD-10-CM | POA: Diagnosis not present

## 2021-06-01 DIAGNOSIS — J301 Allergic rhinitis due to pollen: Secondary | ICD-10-CM | POA: Diagnosis not present

## 2021-06-02 ENCOUNTER — Telehealth: Payer: Self-pay | Admitting: Internal Medicine

## 2021-06-02 NOTE — Telephone Encounter (Signed)
Patient is scheduled with Dr. Leone Payor on 1/27, states that she has been throwing up and cant seem to keep anything on her stomach. Seeking advice if there is anything she can take to hold her over until her appointment. Please advise.

## 2021-06-03 ENCOUNTER — Other Ambulatory Visit: Payer: Self-pay

## 2021-06-03 DIAGNOSIS — R1115 Cyclical vomiting syndrome unrelated to migraine: Secondary | ICD-10-CM

## 2021-06-03 MED ORDER — ONDANSETRON HCL 4 MG PO TABS: 4.0000 mg | ORAL_TABLET | Freq: Four times a day (QID) | ORAL | 1 refills | Status: DC | PRN

## 2021-06-03 NOTE — Telephone Encounter (Signed)
Pt states that she is Patricia Eaton. Pt states that she has NO abdominal pain, NO diarrhea. Her only complaint is throwing Eaton solid foods, water, oatmeal. ' Pt states that she is currently NOT taking any medications for the N/V:  Pt was offered a sooner appointment  with an APP although pt stated that she only wants to see Dr. Leone Payor. Please advise

## 2021-06-03 NOTE — Telephone Encounter (Signed)
Prescription sent to pharmacy : Pt made aware Pt states that she does not have any vertigo or any inner ear symptoms Pt reminded of appointment with Dr. Carlean Purl 06/24/2021 at 10:10 Pt verbalized understanding with all questions answered.

## 2021-06-03 NOTE — Telephone Encounter (Signed)
Ondansetron 4 mg disintegrating tablet 1 every 6 hrs prn # 30 1 refill  Also check with her to make sure she is not having vertigo or inner ear symptoms as those can cause nausea and vomiting - if she is needs PCP

## 2021-06-06 ENCOUNTER — Telehealth (HOSPITAL_BASED_OUTPATIENT_CLINIC_OR_DEPARTMENT_OTHER): Payer: Self-pay

## 2021-06-06 NOTE — Telephone Encounter (Signed)
Patient called in to request to have her lab appointment moved up to a sooner date  Patient states for he last several months she has been dealing with uncontrolable nausea and vomiting  She has been followed by [facility] and they cannot seem to find the cause of her vomiting or abdominal discomfort Patient states they have not ordered any labs to rule out any other root cause of her symptoms.  She is requesting "Labs" to be checked to help get to the root of the issue Patient also requested to have alternative GI options for a second opinion Provided patient with the contact information for [facility] and [facility] Will route to Dr. de Guam for advisement on what other labs should be checked Patient is aware and agreeable to plans

## 2021-06-14 DIAGNOSIS — J3081 Allergic rhinitis due to animal (cat) (dog) hair and dander: Secondary | ICD-10-CM | POA: Diagnosis not present

## 2021-06-14 DIAGNOSIS — J301 Allergic rhinitis due to pollen: Secondary | ICD-10-CM | POA: Diagnosis not present

## 2021-06-14 DIAGNOSIS — J3089 Other allergic rhinitis: Secondary | ICD-10-CM | POA: Diagnosis not present

## 2021-06-16 DIAGNOSIS — J3081 Allergic rhinitis due to animal (cat) (dog) hair and dander: Secondary | ICD-10-CM | POA: Diagnosis not present

## 2021-06-16 DIAGNOSIS — J301 Allergic rhinitis due to pollen: Secondary | ICD-10-CM | POA: Diagnosis not present

## 2021-06-16 DIAGNOSIS — J3089 Other allergic rhinitis: Secondary | ICD-10-CM | POA: Diagnosis not present

## 2021-06-24 ENCOUNTER — Ambulatory Visit: Payer: Medicare Other | Admitting: Internal Medicine

## 2021-06-29 ENCOUNTER — Other Ambulatory Visit (HOSPITAL_BASED_OUTPATIENT_CLINIC_OR_DEPARTMENT_OTHER): Payer: Self-pay

## 2021-06-29 DIAGNOSIS — E559 Vitamin D deficiency, unspecified: Secondary | ICD-10-CM

## 2021-06-29 DIAGNOSIS — E538 Deficiency of other specified B group vitamins: Secondary | ICD-10-CM

## 2021-06-30 ENCOUNTER — Ambulatory Visit (HOSPITAL_BASED_OUTPATIENT_CLINIC_OR_DEPARTMENT_OTHER): Payer: Medicare Other

## 2021-06-30 ENCOUNTER — Other Ambulatory Visit: Payer: Self-pay

## 2021-06-30 DIAGNOSIS — E538 Deficiency of other specified B group vitamins: Secondary | ICD-10-CM | POA: Diagnosis not present

## 2021-06-30 DIAGNOSIS — J3081 Allergic rhinitis due to animal (cat) (dog) hair and dander: Secondary | ICD-10-CM | POA: Diagnosis not present

## 2021-06-30 DIAGNOSIS — E559 Vitamin D deficiency, unspecified: Secondary | ICD-10-CM | POA: Diagnosis not present

## 2021-06-30 DIAGNOSIS — J301 Allergic rhinitis due to pollen: Secondary | ICD-10-CM | POA: Diagnosis not present

## 2021-06-30 DIAGNOSIS — J3089 Other allergic rhinitis: Secondary | ICD-10-CM | POA: Diagnosis not present

## 2021-07-01 ENCOUNTER — Telehealth (HOSPITAL_BASED_OUTPATIENT_CLINIC_OR_DEPARTMENT_OTHER): Payer: Self-pay

## 2021-07-01 LAB — VITAMIN D 25 HYDROXY (VIT D DEFICIENCY, FRACTURES): Vit D, 25-Hydroxy: 59.5 ng/mL (ref 30.0–100.0)

## 2021-07-01 LAB — VITAMIN B12: Vitamin B-12: 1585 pg/mL — ABNORMAL HIGH (ref 232–1245)

## 2021-07-01 NOTE — Telephone Encounter (Signed)
Results released by Dr. de Peru and reviewed by patient via MyChart Instructed patient to contact the office with any questions or concerns. Plan to discuss labs at OV on 02/07 per Dr. de Peru

## 2021-07-01 NOTE — Telephone Encounter (Signed)
-----   Message from Hosie Poisson Peru, MD sent at 07/01/2021  8:04 AM EST ----- Vitamin B12 is above normal range.  Vitamin D is within normal range.  Can discuss further at office visit.

## 2021-07-05 ENCOUNTER — Encounter (HOSPITAL_BASED_OUTPATIENT_CLINIC_OR_DEPARTMENT_OTHER): Payer: Self-pay | Admitting: Family Medicine

## 2021-07-05 ENCOUNTER — Ambulatory Visit (INDEPENDENT_AMBULATORY_CARE_PROVIDER_SITE_OTHER): Payer: Medicare Other | Admitting: Family Medicine

## 2021-07-05 ENCOUNTER — Other Ambulatory Visit: Payer: Self-pay

## 2021-07-05 VITALS — BP 108/70 | HR 73 | Ht 67.0 in | Wt 202.0 lb

## 2021-07-05 DIAGNOSIS — Z9884 Bariatric surgery status: Secondary | ICD-10-CM | POA: Diagnosis not present

## 2021-07-05 DIAGNOSIS — K5909 Other constipation: Secondary | ICD-10-CM | POA: Diagnosis not present

## 2021-07-05 DIAGNOSIS — R5383 Other fatigue: Secondary | ICD-10-CM | POA: Diagnosis not present

## 2021-07-05 DIAGNOSIS — K2100 Gastro-esophageal reflux disease with esophagitis, without bleeding: Secondary | ICD-10-CM | POA: Diagnosis not present

## 2021-07-05 MED ORDER — TIZANIDINE HCL 4 MG PO TABS: 4.0000 mg | ORAL_TABLET | Freq: Every day | ORAL | 1 refills | Status: AC | PRN

## 2021-07-05 NOTE — Progress Notes (Incomplete)
° ° °  Procedures performed today:    None.  Independent interpretation of notes and tests performed by another provider:   None.  Brief History, Exam, Impression, and Recommendations:    BP 108/70    Pulse 73    Ht 5\' 7"  (1.702 m)    Wt 202 lb (91.6 kg)    SpO2 98%    BMI 31.64 kg/m   No problem-specific Assessment & Plan notes found for this encounter.     ___________________________________________ Stacie Knutzen de , MD, ABFM, CAQSM Primary Care and Sports Medicine [facility]

## 2021-07-05 NOTE — Patient Instructions (Signed)
°  Medication Instructions:  Your physician recommends that you continue on your current medications as directed. Please refer to the Current Medication list given to you today. --If you need a refill on any your medications before your next appointment, please call your pharmacy first. If no refills are authorized on file call the office.-- Lab Work: Your physician has recommended that you have lab work today: CBC, CMP, and TSH If you have labs (blood work) drawn today and your tests are completely normal, you will receive your results via [place] a phone call from our staff.  Please ensure you check your voicemail in the event that you authorized detailed messages to be left on a delegated number. If you have any lab test that is abnormal or we need to change your treatment, we will call you to review the results.  Referrals/Procedures/Imaging: A referral has been placed for you to [facility] for evaluation and treatment. Someone from the scheduling department will be in contact with you in regards to coordinating your consultation. If you do not hear from any of the schedulers within 7-10 business days please give their office a call.  [facility] [address], [place], [place] [address]  [phone]  Follow-Up: Your next appointment:   Your physician recommends that you schedule a follow-up appointment in: 3-4 MONTHS with Dr. de Guam  You will receive a text message or e-mail with a link to a survey about your care and experience with [place] today! We would greatly appreciate your feedback!   Thanks for letting us be apart of your health journey!!  Primary Care and Sports Medicine   Dr. Arlina Robes [place]   We encourage you to activate your patient portal called "MyChart".  Sign up information is provided on this After Visit Summary.  MyChart is used to connect with patients for Virtual Visits (Telemedicine).  Patients are able to view lab/test  results, encounter notes, upcoming appointments, etc.  Non-urgent messages can be sent to your provider as well. To learn more about what you can do with MyChart, please visit --  [email or web address].

## 2021-07-06 LAB — CBC WITH DIFFERENTIAL/PLATELET
Basophils Absolute: 0 10*3/uL (ref 0.0–0.2)
Basos: 0 %
EOS (ABSOLUTE): 0.1 10*3/uL (ref 0.0–0.4)
Eos: 2 %
Hematocrit: 38.9 % (ref 34.0–46.6)
Hemoglobin: 12.9 g/dL (ref 11.1–15.9)
Immature Grans (Abs): 0 10*3/uL (ref 0.0–0.1)
Immature Granulocytes: 0 %
Lymphocytes Absolute: 2.1 10*3/uL (ref 0.7–3.1)
Lymphs: 38 %
MCH: 29.5 pg (ref 26.6–33.0)
MCHC: 33.2 g/dL (ref 31.5–35.7)
MCV: 89 fL (ref 79–97)
Monocytes Absolute: 0.5 10*3/uL (ref 0.1–0.9)
Monocytes: 9 %
Neutrophils Absolute: 2.8 10*3/uL (ref 1.4–7.0)
Neutrophils: 51 %
Platelets: 231 10*3/uL (ref 150–450)
RBC: 4.38 x10E6/uL (ref 3.77–5.28)
RDW: 13.1 % (ref 11.7–15.4)
WBC: 5.6 10*3/uL (ref 3.4–10.8)

## 2021-07-06 LAB — COMPREHENSIVE METABOLIC PANEL
ALT: 11 IU/L (ref 0–32)
AST: 11 IU/L (ref 0–40)
Albumin/Globulin Ratio: 1.8 (ref 1.2–2.2)
Albumin: 4.2 g/dL (ref 3.8–4.8)
Alkaline Phosphatase: 149 IU/L — ABNORMAL HIGH (ref 44–121)
BUN/Creatinine Ratio: 24 (ref 12–28)
BUN: 18 mg/dL (ref 8–27)
Bilirubin Total: 0.2 mg/dL (ref 0.0–1.2)
CO2: 27 mmol/L (ref 20–29)
Calcium: 9.6 mg/dL (ref 8.7–10.3)
Chloride: 103 mmol/L (ref 96–106)
Creatinine, Ser: 0.74 mg/dL (ref 0.57–1.00)
Globulin, Total: 2.3 g/dL (ref 1.5–4.5)
Glucose: 98 mg/dL (ref 70–99)
Potassium: 4.8 mmol/L (ref 3.5–5.2)
Sodium: 142 mmol/L (ref 134–144)
Total Protein: 6.5 g/dL (ref 6.0–8.5)
eGFR: 87 mL/min/{1.73_m2} (ref >59)

## 2021-07-06 LAB — TSH RFX ON ABNORMAL TO FREE T4: TSH: 1.57 u[IU]/mL (ref 0.450–4.500)

## 2021-07-15 DIAGNOSIS — J301 Allergic rhinitis due to pollen: Secondary | ICD-10-CM | POA: Diagnosis not present

## 2021-07-15 DIAGNOSIS — J3089 Other allergic rhinitis: Secondary | ICD-10-CM | POA: Diagnosis not present

## 2021-07-15 DIAGNOSIS — J3081 Allergic rhinitis due to animal (cat) (dog) hair and dander: Secondary | ICD-10-CM | POA: Diagnosis not present

## 2021-07-25 DIAGNOSIS — J3089 Other allergic rhinitis: Secondary | ICD-10-CM | POA: Diagnosis not present

## 2021-07-25 DIAGNOSIS — J3081 Allergic rhinitis due to animal (cat) (dog) hair and dander: Secondary | ICD-10-CM | POA: Diagnosis not present

## 2021-07-25 DIAGNOSIS — J301 Allergic rhinitis due to pollen: Secondary | ICD-10-CM | POA: Diagnosis not present

## 2021-07-26 NOTE — Progress Notes (Signed)
CARDIOLOGY CONSULT NOTE       Patient ID: Patricia Eaton MRN: [id number] DOB/AGE: [birth year] 71 y.o.  Admit date: (Not on file) Referring Physician: [name] Primary Physician: de [place]am, Raymond J, MD Primary Cardiologist: Johnsie Cancel  Reason for Consultation: Family history of CAD    HPI:  71 y.o. referred by Dr [name] for family history of CAD First seen by me 02/24/20 . She established care with him on 01/11/20. She has distant history of tick bite Rx doxycycline lyme testing equivocal. 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 Monitor 2019 no significant arrhythmiias. Rx with PRN atenolol Seemed to be stress induced She has GERD post gastric bypass. 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  No cardiac issues   ROS All other systems reviewed and negative except as noted above  Past Medical History:  Diagnosis Date   Allergy to alpha-gal    Angio-edema    Anxiety    Asthma    childhood asthma   Colonic mass    In [place], found inconclusive   Eczema    Environmental allergies    trees, grass, oak trees   GERD (gastroesophageal reflux disease)    Leg cramps 12/15/2019   Lumbar spinal stenosis 12/15/2019   L2-3 level   Lyme disease    Neuropathy    Numbness    Pulmonary embolism (HCC)    Seasonal allergies     Family History  Problem Relation Age of Onset   Colon cancer Maternal Aunt     Crohn's disease Grandchild    Crohn's disease Son    Cancer Mother        kidney or liver   Heart failure Father    Heart attack Father    Pancreatic cancer Sister    Stroke Maternal Grandmother    Allergic rhinitis Neg Hx    Asthma Neg Hx    Eczema Neg Hx    Urticaria Neg Hx    Esophageal cancer Neg Hx    Stomach cancer Neg Hx     Social History   Socioeconomic History   Marital status: Married    Spouse name: Not on file   Number of children: 3   Years of education: 12   Highest education level: High school graduate  Occupational History   Occupation: Retired from trucking  Tobacco Use   Smoking status: Former    Types: Cigarettes    Quit date: 05/29/1997    Years since quitting: 24.1   Smokeless tobacco: Never  Vaping Use   Vaping Use: Never used  Substance and Sexual Activity   Alcohol use: Yes    Comment: occasional   Drug use: Never   Sexual activity: Not on file  Other Topics Concern   Not on file  Social History Narrative   Lives at home with husband.  Married multiple times.  3 grown sons also grandchildren and step grandchildren.   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    Social Determinants of Health   Financial Resource Strain: Not on file  Food Insecurity: Not on file  Transportation Needs: Not on file  Physical Activity: Not on file  Stress: Not on file  Social Connections: Not on file  Intimate Partner Violence: Not on file    Past Surgical History:  Procedure Laterality Date   APPENDECTOMY     COLONOSCOPY  2018   [place]: diverticulosis   ESOPHAGOGASTRODUODENOSCOPY N/A 01/09/2018   normal esophagus, normal residual stomach status post gastric obesity procedure.   GASTRIC BYPASS  2003   LUMBAR FUSION  2016   PARTIAL HYSTERECTOMY  1980   TONSILLECTOMY     WRIST FRACTURE SURGERY  2013       Current Outpatient Medications:    Ascorbic Acid (VITAMIN C WITH ROSE HIPS) 1000 MG tablet, Take 1,000 mg by mouth daily., Disp: , Rfl:    aspirin EC 81 MG tablet, Take 81 mg by mouth daily., Disp: , Rfl:    atorvastatin (LIPITOR) 10 MG tablet, Take 1 tablet (10 mg total) by mouth daily., Disp: 90 tablet, Rfl: 3   CALCIUM PO, Take 500 mg by mouth daily. , Disp: , Rfl:    cholecalciferol (VITAMIN D) 1000 units tablet, Take 5,000 Units by mouth daily. , Disp: , Rfl:    CRANBERRY PO, Take 1 tablet by mouth daily., Disp: , Rfl:    cyanocobalamin (,VITAMIN B-12,) 1000 MCG/ML injection, Inject 1 mL (1,000 mcg total) into the skin once a week., Disp: 30 mL, Rfl: 3   EPINEPHrine 0.3 mg/0.3 mL IJ SOAJ injection, See admin instructions., Disp: , Rfl:    ergocalciferol (VITAMIN D2) 1.25 MG (50000 UT) capsule, Take 1 capsule (50,000 Units total) by mouth once a week., Disp: 12 capsule, Rfl: 3   ferrous sulfate 325 (65 FE) MG tablet, Take 325 mg by mouth daily with breakfast., Disp: , Rfl:    fluticasone (FLONASE) 50 MCG/ACT nasal spray, Place 2 sprays into both nostrils daily., Disp: , Rfl:    hydrochlorothiazide (MICROZIDE) 12.5 MG capsule, Take 1 capsule (12.5 mg total) by mouth daily as needed (for edema)., Disp: 90 capsule, Rfl: 3   MAGNESIUM PO, Take 1 tablet by mouth daily., Disp: , Rfl:    melatonin 3 MG TABS tablet, Take 6 mg by mouth at bedtime., Disp: , Rfl:    Misc Natural Products ([place]) CAPS, See admin instructions., Disp: , Rfl:    omeprazole (PRILOSEC) 40 MG capsule, Take 40 mg by mouth daily., Disp: , Rfl:    ondansetron (ZOFRAN) 4 MG tablet, Take 1 tablet (4 mg total) by mouth every 6 (six) hours as needed for nausea or vomiting., Disp: 30 tablet, Rfl: 1   polyethylene glycol powder (GLYCOLAX/MIRALAX) 17 GM/SCOOP powder, Take 17 g by mouth daily., Disp: , Rfl:    pregabalin (LYRICA) 100 MG capsule, Take 1 capsule (100 mg total) by mouth as directed. Take 100mg  in the morning and  200mg  at night, Disp: 270 capsule, Rfl: 1   PRESCRIPTION MEDICATION, Allergy shot three times weekly, Disp: , Rfl:    tiZANidine (ZANAFLEX) 4 MG tablet, Take 1 tablet (4 mg total) by mouth daily as needed., Disp: 30 tablet, Rfl: 1  trimethoprim (TRIMPEX) 100 MG tablet, Take 100 mg by mouth daily., Disp: , Rfl:    zinc gluconate 50 MG tablet, Take 50 mg by mouth daily., Disp: , Rfl:     Physical Exam: There were no vitals taken for this visit.   Affect appropriate Healthy:  appears stated age 46: normal Neck supple with no adenopathy JVP normal no bruits no thyromegaly Lungs clear with no wheezing and good diaphragmatic motion Heart:  S1/S2 no murmur, no rub, gallop or click PMI normal Abdomen: benighn, post bariatric surgery  Distal pulses intact with no bruits No edema Neuro non-focal Skin warm and dry No muscular weakness   Labs:   Lab Results  Component Value Date   WBC 5.6 07/05/2021   HGB 12.9 07/05/2021   HCT 38.9 07/05/2021   MCV 89 07/05/2021   PLT 231 07/05/2021   No results for input(s): NA, K, CL, CO2, BUN, CREATININE, CALCIUM, PROT, BILITOT, ALKPHOS, ALT, AST, GLUCOSE in the last 168 hours.  Invalid input(s): LABALBU No results found for: CKTOTAL, CKMB, CKMBINDEX, TROPONINI  Lab Results  Component Value Date   CHOL 134 05/26/2020   CHOL 192 02/27/2020   Lab Results  Component Value Date   HDL 63 05/26/2020   HDL 51 02/27/2020   Lab Results  Component Value Date   LDLCALC 52 05/26/2020   LDLCALC 111 (H) 02/27/2020   Lab Results  Component Value Date   TRIG 102 05/26/2020   TRIG 173 (H) 02/27/2020   Lab Results  Component Value Date   CHOLHDL 3.8 02/27/2020   No results found for: LDLDIRECT    Radiology: No results found.  EKG: 08/01/2021 SR rate 80 normal    ASSESSMENT AND PLAN:   1. CAD: non obstructive left dominant system with calcium score 62 isolated to LAD on CT 10/15/17  On ASA 81 mg Started on lipitor and LDL at target now  05/26/20 52   2. GERD: post gastric bypass on Prilosec   3. Ortho:  Lumbar and cervical spine issues post fusion and injection Rx f/u IR radiology and neuro continue Lyrica  4. Fatigue:  Functional rhythm and vitals fine exam fine EF 60-65% by TTE 03/01/20 no significant valve dx   F/U [place] in a year   Signed: Jenkins Rouge 07/26/2021, 12:51 PM

## 2021-08-01 ENCOUNTER — Encounter: Payer: Self-pay | Admitting: Cardiovascular Disease

## 2021-08-01 ENCOUNTER — Ambulatory Visit (INDEPENDENT_AMBULATORY_CARE_PROVIDER_SITE_OTHER): Payer: Medicare Other | Admitting: Cardiovascular Disease

## 2021-08-01 ENCOUNTER — Other Ambulatory Visit: Payer: Self-pay

## 2021-08-01 VITALS — BP 116/82 | HR 80 | Ht 67.0 in | Wt 202.4 lb

## 2021-08-01 DIAGNOSIS — I2510 Atherosclerotic heart disease of native coronary artery without angina pectoris: Secondary | ICD-10-CM | POA: Diagnosis not present

## 2021-08-01 NOTE — Patient Instructions (Signed)
Follow-Up: °Follow up in 1 year with Dr. Nishan ° °Any Other Special Instructions Will Be Listed Below (If Applicable). ° ° ° ° °If you need a refill on your cardiac medications before your next appointment, please call your pharmacy. ° °

## 2021-08-10 DIAGNOSIS — J301 Allergic rhinitis due to pollen: Secondary | ICD-10-CM | POA: Diagnosis not present

## 2021-08-10 DIAGNOSIS — J3089 Other allergic rhinitis: Secondary | ICD-10-CM | POA: Diagnosis not present

## 2021-08-10 DIAGNOSIS — J3081 Allergic rhinitis due to animal (cat) (dog) hair and dander: Secondary | ICD-10-CM | POA: Diagnosis not present

## 2021-08-15 NOTE — Assessment & Plan Note (Addendum)
Reports continued issues with intermittent nausea and vomiting.  Generally these episodes will last for a week or 2 and then resolve.  She has had evaluation with [facility], evaluation at that time was generally unremarkable.  She would like to proceed with evaluation with [facility] -referral placed today ?

## 2021-08-15 NOTE — Assessment & Plan Note (Signed)
Reports chronic issue with fatigue, feels that it has been worse over the past couple weeks.  Thinks it may be related to her sleep pattern.  She indicates that there will be times when she will get adequate consecutive hours of sleep, however she will wake up the following day and still feel extremely fatigued.  She is requesting laboratory evaluation in regards to this, will check CBC, CMP, thyroid function ?

## 2021-08-15 NOTE — Assessment & Plan Note (Addendum)
Patient currently manages with intermittent MiraLAX as needed ?As above, she has had evaluation with [facility] recently, would like to proceed with evaluation with [facility], referral placed ?

## 2021-08-17 ENCOUNTER — Telehealth (HOSPITAL_BASED_OUTPATIENT_CLINIC_OR_DEPARTMENT_OTHER): Payer: Self-pay | Admitting: Family Medicine

## 2021-08-17 NOTE — Telephone Encounter (Signed)
Patient calling with questions concerning her referral. Please call her back at [phone]. AS,CMA ?

## 2021-08-17 NOTE — Telephone Encounter (Signed)
Called pt regarding referral questions and she stated they had already reached out no issue. ?

## 2021-08-18 ENCOUNTER — Other Ambulatory Visit: Payer: Self-pay | Admitting: Cardiovascular Disease

## 2021-08-18 ENCOUNTER — Other Ambulatory Visit: Payer: Self-pay | Admitting: Neurology

## 2021-08-25 DIAGNOSIS — J3081 Allergic rhinitis due to animal (cat) (dog) hair and dander: Secondary | ICD-10-CM | POA: Diagnosis not present

## 2021-08-25 DIAGNOSIS — J301 Allergic rhinitis due to pollen: Secondary | ICD-10-CM | POA: Diagnosis not present

## 2021-08-25 DIAGNOSIS — J3089 Other allergic rhinitis: Secondary | ICD-10-CM | POA: Diagnosis not present

## 2021-09-21 DIAGNOSIS — J301 Allergic rhinitis due to pollen: Secondary | ICD-10-CM | POA: Diagnosis not present

## 2021-09-21 DIAGNOSIS — J3089 Other allergic rhinitis: Secondary | ICD-10-CM | POA: Diagnosis not present

## 2021-09-21 DIAGNOSIS — J3081 Allergic rhinitis due to animal (cat) (dog) hair and dander: Secondary | ICD-10-CM | POA: Diagnosis not present

## 2021-09-27 DIAGNOSIS — J3089 Other allergic rhinitis: Secondary | ICD-10-CM | POA: Diagnosis not present

## 2021-09-27 DIAGNOSIS — J301 Allergic rhinitis due to pollen: Secondary | ICD-10-CM | POA: Diagnosis not present

## 2021-09-27 DIAGNOSIS — J3081 Allergic rhinitis due to animal (cat) (dog) hair and dander: Secondary | ICD-10-CM | POA: Diagnosis not present

## 2021-09-30 DIAGNOSIS — J3081 Allergic rhinitis due to animal (cat) (dog) hair and dander: Secondary | ICD-10-CM | POA: Diagnosis not present

## 2021-09-30 DIAGNOSIS — J301 Allergic rhinitis due to pollen: Secondary | ICD-10-CM | POA: Diagnosis not present

## 2021-10-07 ENCOUNTER — Ambulatory Visit (INDEPENDENT_AMBULATORY_CARE_PROVIDER_SITE_OTHER): Payer: No Typology Code available for payment source

## 2021-10-07 ENCOUNTER — Encounter (HOSPITAL_BASED_OUTPATIENT_CLINIC_OR_DEPARTMENT_OTHER): Payer: Self-pay

## 2021-10-07 DIAGNOSIS — Z0000 Encounter for general adult medical examination without abnormal findings: Secondary | ICD-10-CM | POA: Diagnosis not present

## 2021-10-07 NOTE — Progress Notes (Signed)
? ?Subjective:  ? Patricia Eaton is a 71 y.o. female who presents for an Initial Medicare Annual Wellness Visit. ?I connected with  [name] on 10/07/21 by a audio enabled telemedicine application and verified that I am speaking with the correct person using two identifiers. ? ?Patient Location: Home ? ?Provider Location: Home Office ? ?I discussed the limitations of evaluation and management by telemedicine. The patient expressed understanding and agreed to proceed.  ?Objective:  ?  ?Today's Vitals  ? ?There is no height or weight on file to calculate BMI. ? ? ?  01/09/2018  ? 12:13 PM  ?Advanced Directives  ?Does Patient Have a Medical Advance Directive? Yes  ?Type of Estate agent of [place];Living will  ?Copy of Healthcare Power of Attorney in Chart? No - copy requested  ? ? ?Current Medications (verified) ?Outpatient Encounter Medications as of 10/07/2021  ?Medication Sig  ? Ascorbic Acid (VITAMIN C WITH ROSE HIPS) 1000 MG tablet Take 1,000 mg by mouth daily.  ? aspirin EC 81 MG tablet Take 81 mg by mouth daily.  ? atorvastatin (LIPITOR) 10 MG tablet TAKE ONE TABLET BY MOUTH EVERY DAY  ? CALCIUM PO Take 500 mg by mouth daily.   ? cholecalciferol (VITAMIN D) 1000 units tablet Take 5,000 Units by mouth daily.   ? CRANBERRY PO Take 1 tablet by mouth daily.  ? cyanocobalamin (,VITAMIN B-12,) 1000 MCG/ML injection Inject 1 mL (1,000 mcg total) into the skin once a week.  ? EPINEPHrine 0.3 mg/0.3 mL IJ SOAJ injection See admin instructions.  ? ferrous sulfate 325 (65 FE) MG tablet Take 325 mg by mouth daily with breakfast.  ? hydrochlorothiazide (MICROZIDE) 12.5 MG capsule Take 1 capsule (12.5 mg total) by mouth daily as needed (for edema).  ? MAGNESIUM PO Take 1 tablet by mouth daily.  ? melatonin 3 MG TABS tablet Take 6 mg by mouth at bedtime.  ? Misc Natural Products (NEURIVA) CAPS See admin instructions.  ? omeprazole (PRILOSEC) 40 MG capsule Take 40 mg by mouth  daily.  ? pregabalin (LYRICA) 100 MG capsule TAKE 1 CAPSULE BY MOUTH IN THE MORNING AND TAKE 2 CAPSULES BY MOUTH AT NIGHT  ? PRESCRIPTION MEDICATION Allergy shot three times weekly  ? tiZANidine (ZANAFLEX) 4 MG tablet Take 1 tablet (4 mg total) by mouth daily as needed.  ? trimethoprim (TRIMPEX) 100 MG tablet Take 100 mg by mouth daily.  ? zinc gluconate 50 MG tablet Take 50 mg by mouth daily.  ? ergocalciferol (VITAMIN D2) 1.25 MG (50000 UT) capsule Take 1 capsule (50,000 Units total) by mouth once a week. (Patient not taking: Reported on 10/07/2021)  ? fluticasone (FLONASE) 50 MCG/ACT nasal spray Place 2 sprays into both nostrils daily. (Patient not taking: Reported on 10/07/2021)  ? ondansetron (ZOFRAN) 4 MG tablet Take 1 tablet (4 mg total) by mouth every 6 (six) hours as needed for nausea or vomiting. (Patient not taking: Reported on 10/07/2021)  ? polyethylene glycol powder (GLYCOLAX/MIRALAX) 17 GM/SCOOP powder Take 17 g by mouth daily. (Patient not taking: Reported on 10/07/2021)  ? ?No facility-administered encounter medications on file as of 10/07/2021.  ? ? ?Allergies (verified) ?Molds & smuts and Other  ? ?History: ?Past Medical History:  ?Diagnosis Date  ? Allergy to alpha-gal   ? Angio-edema   ? Anxiety   ? Asthma   ? childhood asthma  ? Colonic mass   ? In [place], found inconclusive  ? Eczema   ? Environmental  allergies   ? trees, grass, oak trees  ? GERD (gastroesophageal reflux disease)   ? Leg cramps 12/15/2019  ? Lumbar spinal stenosis 12/15/2019  ? L2-3 level  ? Lyme disease   ? Neuropathy   ? Numbness   ? Pulmonary embolism (HCC)   ? Seasonal allergies   ? ?Past Surgical History:  ?Procedure Laterality Date  ? APPENDECTOMY    ? COLONOSCOPY  2018  ? [place]: diverticulosis  ? ESOPHAGOGASTRODUODENOSCOPY N/A 01/09/2018  ? normal esophagus, normal residual stomach status post gastric obesity procedure.  ? GASTRIC BYPASS  2003  ? LUMBAR FUSION  2016  ? PARTIAL HYSTERECTOMY  1980  ? TONSILLECTOMY    ? WRIST  FRACTURE SURGERY  2013  ? ?Family History  ?Problem Relation Age of Onset  ? Colon cancer Maternal Aunt   ? Crohn's disease Grandchild   ? Crohn's disease Son   ? Cancer Mother   ?     kidney or liver  ? Heart failure Father   ? Heart attack Father   ? Pancreatic cancer Sister   ? Stroke Maternal Grandmother   ? Allergic rhinitis Neg Hx   ? Asthma Neg Hx   ? Eczema Neg Hx   ? Urticaria Neg Hx   ? Esophageal cancer Neg Hx   ? Stomach cancer Neg Hx   ? ?Social History  ? ?Socioeconomic History  ? Marital status: Married  ?  Spouse name: Not on file  ? Number of children: 3  ? Years of education: 612  ? Highest education level: High school graduate  ?Occupational History  ? Occupation: Retired from trucking  ?Tobacco Use  ? Smoking status: Former  ?  Types: Cigarettes  ?  Quit date: 05/29/1997  ?  Years since quitting: 24.3  ? Smokeless tobacco: Never  ?Vaping Use  ? Vaping Use: Never used  ?Substance and Sexual Activity  ? Alcohol use: Yes  ?  Comment: occasional  ? Drug use: Never  ? Sexual activity: Not on file  ?Other Topics Concern  ? Not on file  ?Social History Narrative  ? Lives at home with husband.  Married multiple times.  3 grown sons also grandchildren and step grandchildren.  ? 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 Former smoker no alcohol no drug use no current tobacco  ? Right-handed  ? Caffeine: 2 cups per day.   ? ?Social Determinants of Health  ? ?Financial Resource Strain: Low Risk   ? Difficulty of Paying Living Expenses: Not very hard  ?Food Insecurity: No Food Insecurity  ? Worried About Programme researcher, broadcasting/film/videounning Out of Food in the Last Year: Never true  ? Ran Out of Food in the Last Year: Never true  ?Transportation Needs: No Transportation Needs  ? Lack of Transportation (Medical): No  ? Lack of Transportation (Non-Medical): No  ?Physical Activity: Inactive  ? Days of Exercise per Week: 0 days  ? Minutes of Exercise per Session: 0 min  ?Stress: No Stress Concern  Present  ? Feeling of Stress : Not at all  ?Social Connections: Socially Integrated  ? Frequency of Communication with Friends and Family: More than three times a week  ? Frequency of Social Gatherings with Friends and Family: Twice a week  ? Attends Religious Services: 1 to 4 times per year  ? Active Member of Clubs or Organizations: Yes  ? Attends BankerClub or Organization Meetings: 1 to 4 times per  year  ? Marital Status: Married  ? ? ?Tobacco Counseling ?Counseling given: Yes ? ? ?Clinical Intake: ? ?Pre-visit preparation completed: Yes ? ?Pain : No/denies pain ? ?  ? ?Diabetes: No ? ?How often do you need to have someone help you when you read instructions, pamphlets, or other written materials from your doctor or pharmacy?: 1 - Never ?What is the last grade level you completed in school?: 12th grade ? ?Diabetic?no  ? ?Interpreter Needed?: No ? ?  ? ? ?Activities of Daily Living ? ?  10/07/2021  ?  1:20 PM  ?In your present state of health, do you have any difficulty performing the following activities:  ?Hearing? 0  ?Vision? 0  ?Difficulty concentrating or making decisions? 1  ?Walking or climbing stairs? 0  ?Dressing or bathing? 0  ?Doing errands, shopping? 0  ? ? ?Patient Care Team: ?[place], Buren Kos, MD as PCP - General (Family Medicine) ?Wendall Stade, MD as PCP - Cardiology (Cardiology) ?Rourk, Gerrit Friends, MD as Consulting Physician (Gastroenterology) ?Cammy Copa, MD as Consulting Physician (Orthopedic Surgery) ?Donnetta Hail, MD as Consulting Physician (Rheumatology) ? ?Indicate any recent Medical Services you may have received from other than [facility] providers in the past year (date may be approximate). ? ?   ?Assessment:  ? This is a routine wellness examination for Kristol. ? ?Hearing/Vision screen ?No results found. ? ?Dietary issues and exercise activities discussed: ?  ? ? Goals Addressed   ?None ?  ?Depression Screen ? ?  10/07/2021  ?  1:14 PM 03/04/2021  ?  9:03 AM  ?PHQ 2/9 Scores  ?PHQ - 2  Score 0 0  ?PHQ- 9 Score  3  ?  ?Fall Risk ? ?  10/07/2021  ?  1:19 PM 03/04/2021  ?  9:01 AM  ?Fall Risk   ?Falls in the past year? 1 0  ?Number falls in past yr: 0 0  ?Injury with Fall? 1 0  ?Risk for fall due

## 2021-10-07 NOTE — Patient Instructions (Signed)

## 2021-10-14 DIAGNOSIS — J3089 Other allergic rhinitis: Secondary | ICD-10-CM | POA: Diagnosis not present

## 2021-10-14 DIAGNOSIS — J3081 Allergic rhinitis due to animal (cat) (dog) hair and dander: Secondary | ICD-10-CM | POA: Diagnosis not present

## 2021-10-14 DIAGNOSIS — J301 Allergic rhinitis due to pollen: Secondary | ICD-10-CM | POA: Diagnosis not present

## 2021-10-25 DIAGNOSIS — Z9884 Bariatric surgery status: Secondary | ICD-10-CM | POA: Diagnosis not present

## 2021-10-25 DIAGNOSIS — K219 Gastro-esophageal reflux disease without esophagitis: Secondary | ICD-10-CM | POA: Diagnosis not present

## 2021-10-25 DIAGNOSIS — R112 Nausea with vomiting, unspecified: Secondary | ICD-10-CM | POA: Diagnosis not present

## 2021-10-25 DIAGNOSIS — Z9049 Acquired absence of other specified parts of digestive tract: Secondary | ICD-10-CM | POA: Diagnosis not present

## 2021-10-28 DIAGNOSIS — J3081 Allergic rhinitis due to animal (cat) (dog) hair and dander: Secondary | ICD-10-CM | POA: Diagnosis not present

## 2021-10-28 DIAGNOSIS — J3089 Other allergic rhinitis: Secondary | ICD-10-CM | POA: Diagnosis not present

## 2021-10-28 DIAGNOSIS — J301 Allergic rhinitis due to pollen: Secondary | ICD-10-CM | POA: Diagnosis not present

## 2021-11-02 ENCOUNTER — Encounter (HOSPITAL_BASED_OUTPATIENT_CLINIC_OR_DEPARTMENT_OTHER): Payer: Self-pay | Admitting: Family Medicine

## 2021-11-02 ENCOUNTER — Ambulatory Visit (INDEPENDENT_AMBULATORY_CARE_PROVIDER_SITE_OTHER): Payer: Medicare Other | Admitting: Family Medicine

## 2021-11-02 VITALS — BP 112/73 | HR 76 | Temp 97.6°F | Ht 67.0 in | Wt 205.5 lb

## 2021-11-02 DIAGNOSIS — R635 Abnormal weight gain: Secondary | ICD-10-CM

## 2021-11-02 DIAGNOSIS — J3081 Allergic rhinitis due to animal (cat) (dog) hair and dander: Secondary | ICD-10-CM | POA: Diagnosis not present

## 2021-11-02 DIAGNOSIS — Z9884 Bariatric surgery status: Secondary | ICD-10-CM

## 2021-11-02 DIAGNOSIS — K2100 Gastro-esophageal reflux disease with esophagitis, without bleeding: Secondary | ICD-10-CM

## 2021-11-02 DIAGNOSIS — R748 Abnormal levels of other serum enzymes: Secondary | ICD-10-CM

## 2021-11-02 DIAGNOSIS — J301 Allergic rhinitis due to pollen: Secondary | ICD-10-CM | POA: Diagnosis not present

## 2021-11-02 DIAGNOSIS — R400 Somnolence: Secondary | ICD-10-CM

## 2021-11-02 DIAGNOSIS — J3089 Other allergic rhinitis: Secondary | ICD-10-CM | POA: Diagnosis not present

## 2021-11-02 MED ORDER — KETOCONAZOLE 2 % EX SHAM: MEDICATED_SHAMPOO | CUTANEOUS | 1 refills | Status: DC

## 2021-11-02 NOTE — Progress Notes (Signed)
Procedures performed today:    None.  Independent interpretation of notes and tests performed by another provider:   None.  Brief History, Exam, Impression, and Recommendations:    BP 112/73   Pulse 76   Temp 97.6 F (36.4 C) (Oral)   Ht 5\' 7"  (1.702 m)   Wt 205 lb 8 oz (93.2 kg)   SpO2 97%   BMI 32.19 kg/m   GERD (gastroesophageal reflux disease) Patient did recently establish with new GI provider, Dr. Derrek Monaco.  She reports that this recent evaluation went well. Review of chart indicates that recommendations are for patient to contact their office should she have any acute episodes of nausea/vomiting at which time stat imaging will be completed or patient will be recommended to go to local ED.  They are also planning to complete upper BM colonoscopy for evaluation of her abdominal pain.  She was also recommended to begin pantoprazole once daily with a trial of Bentyl. Currently patient feels that she is doing well.  Does have follow-up arranged with new GI provider.  Hopefully this continued evaluation Result and improvement in symptoms for patient  Weight gain following gastric bypass surgery Patient continues to note some gradual weight gain, does have questions regarding potential weight loss options available.  She is aware that her BMI is elevated and falls within "obese" range.  Discussed options with patient and she would be interested in referral to healthy weight and wellness clinic, referral placed  Elevated alkaline phosphatase level Found to be slightly elevated on recent labs.  Discussed potential causes for this.  We will plan to repeat labs and will also check GGT to allow for better characterization on whether source of elevation is related to gallbladder or alternative etiology  Daytime somnolence She continues to note fatigue.  Feels that fatigue is fairly constant throughout the day.  Indicates that she feels that she gets an adequate number of hours of  sleep, however tends to not feel rested.  She does not note any snoring, is not aware of any nighttime awakenings.  Recent labs have generally been reassuring with no underlying thyroid disease, normal vitamin D, elevated B12 level. She indicates having had sleep study in the past, prior to weight loss related to bypass surgery.  She has had some weight gain since the initial weight loss in the past.  Discussed possibility of sleep apnea, further evaluation with sleep medicine specialist.  She feels that she is unlikely to have sleep apnea at present due to not being aware of any nighttime awakenings.  Did discuss that it is possible to have sleep affected by sleep apnea which leads to impairment normal sleep cycles without being aware of nighttime awakenings. At this time, we will continue to monitor weight management as above and continued evaluation of GI issues  She also reports some scalp flaking, itching.  She has been using over-the-counter shampoos which initially did provide some relief, however symptoms have persisted.  She reports that the shampoo she was using was head and shoulders.  On exam, she does have some flaking of the scalp, some excoriations We will proceed initially with use of ketoconazole shampoo, instructed on proper use.  If continuing symptoms, consider utilization of topical steroid  Spent 45 minutes on this patient encounter, including preparation, chart review, face-to-face counseling with patient and coordination of care, and documentation of encounter  Return in about 3 months (around 02/02/2022) for follow-up.   ___________________________________________ Arva Slaugh [place], MD, ABFM,  [facility] [place]

## 2021-11-03 LAB — COMPREHENSIVE METABOLIC PANEL
ALT: 14 IU/L (ref 0–32)
AST: 19 IU/L (ref 0–40)
Albumin/Globulin Ratio: 2 (ref 1.2–2.2)
Albumin: 4.4 g/dL (ref 3.8–4.8)
Alkaline Phosphatase: 146 IU/L — ABNORMAL HIGH (ref 44–121)
BUN/Creatinine Ratio: 26 (ref 12–28)
BUN: 20 mg/dL (ref 8–27)
Bilirubin Total: 0.3 mg/dL (ref 0.0–1.2)
CO2: 24 mmol/L (ref 20–29)
Calcium: 9 mg/dL (ref 8.7–10.3)
Chloride: 101 mmol/L (ref 96–106)
Creatinine, Ser: 0.76 mg/dL (ref 0.57–1.00)
Globulin, Total: 2.2 g/dL (ref 1.5–4.5)
Glucose: 68 mg/dL — ABNORMAL LOW (ref 70–99)
Potassium: 4.9 mmol/L (ref 3.5–5.2)
Sodium: 142 mmol/L (ref 134–144)
Total Protein: 6.6 g/dL (ref 6.0–8.5)
eGFR: 84 mL/min/{1.73_m2} (ref >59)

## 2021-11-03 LAB — GAMMA GT: GGT: 12 IU/L (ref 0–60)

## 2021-11-07 NOTE — Assessment & Plan Note (Signed)
Patient did recently establish with new GI provider, Dr. Shawn Stall.  She reports that this recent evaluation went well. Review of chart indicates that recommendations are for patient to contact their office should she have any acute episodes of nausea/vomiting at which time stat imaging will be completed or patient will be recommended to go to local ED.  They are also planning to complete upper BM colonoscopy for evaluation of her abdominal pain.  She was also recommended to begin pantoprazole once daily with a trial of Bentyl. Currently patient feels that she is doing well.  Does have follow-up arranged with new GI provider.  Hopefully this continued evaluation Result and improvement in symptoms for patient

## 2021-11-07 NOTE — Assessment & Plan Note (Signed)
Found to be slightly elevated on recent labs.  Discussed potential causes for this.  We will plan to repeat labs and will also check GGT to allow for better characterization on whether source of elevation is related to gallbladder or alternative etiology

## 2021-11-07 NOTE — Assessment & Plan Note (Signed)
Patient continues to note some gradual weight gain, does have questions regarding potential weight loss options available.  She is aware that her BMI is elevated and falls within "obese" range.  Discussed options with patient and she would be interested in referral to healthy weight and wellness clinic, referral placed

## 2021-11-07 NOTE — Assessment & Plan Note (Signed)
She continues to note fatigue.  Feels that fatigue is fairly constant throughout the day.  Indicates that she feels that she gets an adequate number of hours of sleep, however tends to not feel rested.  She does not note any snoring, is not aware of any nighttime awakenings.  Recent labs have generally been reassuring with no underlying thyroid disease, normal vitamin D, elevated B12 level. She indicates having had sleep study in the past, prior to weight loss related to bypass surgery.  She has had some weight gain since the initial weight loss in the past.  Discussed possibility of sleep apnea, further evaluation with sleep medicine specialist.  She feels that she is unlikely to have sleep apnea at present due to not being aware of any nighttime awakenings.  Did discuss that it is possible to have sleep affected by sleep apnea which leads to impairment normal sleep cycles without being aware of nighttime awakenings. At this time, we will continue to monitor weight management as above and continued evaluation of GI issues

## 2021-11-09 DIAGNOSIS — J3089 Other allergic rhinitis: Secondary | ICD-10-CM | POA: Diagnosis not present

## 2021-11-09 DIAGNOSIS — J3081 Allergic rhinitis due to animal (cat) (dog) hair and dander: Secondary | ICD-10-CM | POA: Diagnosis not present

## 2021-11-09 DIAGNOSIS — J301 Allergic rhinitis due to pollen: Secondary | ICD-10-CM | POA: Diagnosis not present

## 2021-11-15 NOTE — Progress Notes (Unsigned)
PATIENT: Patricia Eaton DOB: 1950/07/13  REASON FOR VISIT: Follow up HISTORY FROM: Patient Primary Neurologist: Dr. Anne Hahn   Today 11/15/21 Patricia Eaton   Update 11/16/20 SS: Patricia Eaton is a 71 year old female with history of chronic low back pain.  Has moderate to severe spinal stenosis at L2-3 level with facet joint arthritis and neuroforaminal narrowing at this level.  Has not wished to consider surgery. Dr. Anne Hahn orders ESIs.  She receives Lyrica from this office 100/200 mg daily. Also has Lyme's disease, gets tired easily. Is very active. Lyrica helps the pain and cramps. Takes tizanidine, if she wakes during the night with cramps, once every 2 weeks. If extremely active knows she will have to take it. Sees orthopedics Dr. August Saucer had ESI lumbar, didn't work for her. Here today alone, currently happy with treatment plan.  HISTORY Update 12/15/2019 Dr. Anne Hahn: Patricia Eaton is a 71 year old right-handed white female with a history of chronic low back pain, she has had documented moderate to severe spinal stenosis at the L2-3 level with facet joint arthritis and neuroforaminal narrowing at this level as well.  The patient reports ongoing problems with low back pain and some discomfort into the hips and thighs, she will have muscle cramps that affect the thighs, left greater than right lower extremity.  She may have episodes twice a week that may occur during the day or during the night.  She has had an epidural steroid injection in January 2021 which resulted in some benefit for about a month.  The patient has tizanidine that she may take at night if she has muscle cramps.  She tries to stay active, she swims on a regular basis.  REVIEW OF SYSTEMS: Out of a complete 14 system review of symptoms, the patient complains only of the following symptoms, and all other reviewed systems are negative.  See HPI  ALLERGIES: Allergies  Allergen Reactions    Molds & Smuts Cough    And grass; cause sneezing and watery eyes, too   Other Rash    Oak causes rash    HOME MEDICATIONS: Outpatient Medications Prior to Visit  Medication Sig Dispense Refill   Ascorbic Acid (VITAMIN C WITH ROSE HIPS) 1000 MG tablet Take 1,000 mg by mouth daily.     aspirin EC 81 MG tablet Take 81 mg by mouth daily.     atorvastatin (LIPITOR) 10 MG tablet TAKE ONE TABLET BY MOUTH EVERY DAY 90 tablet 3   CALCIUM PO Take 500 mg by mouth daily.      cholecalciferol (VITAMIN D) 1000 units tablet Take 5,000 Units by mouth daily.      CRANBERRY PO Take 1 tablet by mouth daily.     cyanocobalamin (,VITAMIN B-12,) 1000 MCG/ML injection Inject 1 mL (1,000 mcg total) into the skin once a week. 30 mL 3   EPINEPHrine 0.3 mg/0.3 mL IJ SOAJ injection See admin instructions. (Patient not taking: Reported on 11/02/2021)     ergocalciferol (VITAMIN D2) 1.25 MG (50000 UT) capsule Take 1 capsule (50,000 Units total) by mouth once a week. (Patient not taking: Reported on 10/07/2021) 12 capsule 3   ferrous sulfate 325 (65 FE) MG tablet Take 325 mg by mouth daily with breakfast.     fluticasone (FLONASE) 50 MCG/ACT nasal spray Place 2 sprays into both nostrils daily. (Patient not taking: Reported on 10/07/2021)     hydrochlorothiazide (MICROZIDE) 12.5 MG capsule Take 1 capsule (12.5 mg total) by mouth daily as needed (for  edema). 90 capsule 3   ketoconazole (NIZORAL) 2 % shampoo Apply to scalp twice a week for 8 weeks and then weekly thereafter 120 mL 1   MAGNESIUM PO Take 1 tablet by mouth daily.     melatonin 3 MG TABS tablet Take 6 mg by mouth at bedtime.     Misc Natural Products (NEURIVA) CAPS See admin instructions.     omeprazole (PRILOSEC) 40 MG capsule Take 40 mg by mouth daily. (Patient not taking: Reported on 11/02/2021)     ondansetron (ZOFRAN) 4 MG tablet Take 1 tablet (4 mg total) by mouth every 6 (six) hours as needed for nausea or vomiting. (Patient not taking: Reported on 10/07/2021)  30 tablet 1   polyethylene glycol powder (GLYCOLAX/MIRALAX) 17 GM/SCOOP powder Take 17 g by mouth daily. (Patient not taking: Reported on 10/07/2021)     pregabalin (LYRICA) 100 MG capsule TAKE 1 CAPSULE BY MOUTH IN THE MORNING AND TAKE 2 CAPSULES BY MOUTH AT NIGHT 270 capsule 1   PRESCRIPTION MEDICATION Allergy shot three times weekly     tiZANidine (ZANAFLEX) 4 MG tablet Take 1 tablet (4 mg total) by mouth daily as needed. 30 tablet 1   trimethoprim (TRIMPEX) 100 MG tablet Take 100 mg by mouth daily.     zinc gluconate 50 MG tablet Take 50 mg by mouth daily.     No facility-administered medications prior to visit.    PAST MEDICAL HISTORY: Past Medical History:  Diagnosis Date   Allergy to alpha-gal    Angio-edema    Anxiety    Asthma    childhood asthma   Colonic mass    In [place], found inconclusive   Eczema    Environmental allergies    trees, grass, oak trees   GERD (gastroesophageal reflux disease)    Leg cramps 12/15/2019   Lumbar spinal stenosis 12/15/2019   L2-3 level   Lyme disease    Neuropathy    Numbness    Pulmonary embolism (HCC)    Seasonal allergies     PAST SURGICAL HISTORY: Past Surgical History:  Procedure Laterality Date   APPENDECTOMY     COLONOSCOPY  2018   [place]: diverticulosis   ESOPHAGOGASTRODUODENOSCOPY N/A 01/09/2018   normal esophagus, normal residual stomach status post gastric obesity procedure.   GASTRIC BYPASS  2003   LUMBAR FUSION  2016   PARTIAL HYSTERECTOMY  1980   TONSILLECTOMY     WRIST FRACTURE SURGERY  2013    FAMILY HISTORY: Family History  Problem Relation Age of Onset   Colon cancer Maternal Aunt    Crohn's disease Grandchild    Crohn's disease Son    Cancer Mother        kidney or liver   Heart failure Father    Heart attack Father    Pancreatic cancer Sister    Stroke Maternal Grandmother    Allergic rhinitis Neg Hx    Asthma Neg Hx    Eczema Neg Hx    Urticaria Neg Hx    Esophageal cancer Neg Hx    Stomach  cancer Neg Hx     SOCIAL HISTORY: Social History   Socioeconomic History   Marital status: Married    Spouse name: Not on file   Number of children: 3   Years of education: 12   Highest education level: High school graduate  Occupational History   Occupation: Retired from trucking  Tobacco Use   Smoking status: Former    Types: Cigarettes  Quit date: 05/29/1997    Years since quitting: 24.4   Smokeless tobacco: Never  Vaping Use   Vaping Use: Never used  Substance and Sexual Activity   Alcohol use: Yes    Comment: occasional   Drug use: Never   Sexual activity: Not on file  Other Topics Concern   Not on file  Social History Narrative   Lives at home with husband.  Married multiple times.  3 grown sons also grandchildren and step grandchildren.   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    Social Determinants of Health   Financial Resource Strain: Low Risk  (10/07/2021)   Overall Financial Resource Strain (CARDIA)    Difficulty of Paying Living Expenses: Not very hard  Food Insecurity: No Food Insecurity (10/07/2021)   Hunger Vital Sign    Worried About Running Out of Food in the Last Year: Never true    Ran Out of Food in the Last Year: Never true  Transportation Needs: No Transportation Needs (10/07/2021)   PRAPARE - Administrator, Civil Service (Medical): No    Lack of Transportation (Non-Medical): No  Physical Activity: Inactive (10/07/2021)   Exercise Vital Sign    Days of Exercise per Week: 0 days    Minutes of Exercise per Session: 0 min  Stress: No Stress Concern Present (10/07/2021)   [employer] of Occupational Health - Occupational Stress Questionnaire    Feeling of Stress : Not at all  Social Connections: Socially Integrated (10/07/2021)   Social Connection and Isolation Panel [NHANES]     Frequency of Communication with Friends and Family: More than three times a week    Frequency of Social Gatherings with Friends and Family: Twice a week    Attends Religious Services: 1 to 4 times per year    Active Member of [employer] or Organizations: Yes    Attends Banker Meetings: 1 to 4 times per year    Marital Status: Married  Catering manager Violence: Not At Risk (10/07/2021)   Humiliation, Afraid, Rape, and Kick questionnaire    Fear of Current or Ex-Partner: No    Emotionally Abused: No    Physically Abused: No    Sexually Abused: No   PHYSICAL EXAM  There were no vitals filed for this visit.  There is no height or weight on file to calculate BMI.  Generalized: Well developed, in no acute distress   Neurological examination  Mentation: Alert oriented to time, place, history taking. Follows all commands speech and language fluent Cranial nerve II-XII: Pupils were equal round reactive to light. Extraocular movements were full, visual field were full on confrontational test. Facial sensation and strength were normal. Head turning and shoulder shrug  were normal and symmetric. Motor: The motor testing reveals 5 over 5 strength of all 4 extremities. Good symmetric motor tone is noted throughout.  Sensory: Sensory testing is intact to soft touch on all 4 extremities. No evidence of extinction is noted.  Coordination: Cerebellar testing reveals good finger-nose-finger and heel-to-shin bilaterally.  Gait and station: Gait is normal. Tandem gait is normal.  Reflexes: Deep tendon reflexes are symmetric and normal bilaterally.   DIAGNOSTIC DATA (LABS, IMAGING, TESTING) - I reviewed patient records, labs, notes, testing and imaging myself where available.  Lab Results  Component Value Date  WBC 5.6 07/05/2021   HGB 12.9 07/05/2021   HCT 38.9 07/05/2021   MCV 89 07/05/2021   PLT 231 07/05/2021      Component Value Date/Time   NA 142 11/02/2021 0927   K 4.9  11/02/2021 0927   CL 101 11/02/2021 0927   CO2 24 11/02/2021 0927   GLUCOSE 68 (L) 11/02/2021 0927   GLUCOSE 90 01/11/2021 1050   BUN 20 11/02/2021 0927   CREATININE 0.76 11/02/2021 0927   CREATININE 0.74 01/11/2021 1050   CALCIUM 9.0 11/02/2021 0927   PROT 6.6 11/02/2021 0927   ALBUMIN 4.4 11/02/2021 0927   AST 19 11/02/2021 0927   ALT 14 11/02/2021 0927   ALKPHOS 146 (H) 11/02/2021 0927   BILITOT 0.3 11/02/2021 0927   GFRNONAA >60 02/15/2018 0940   GFRAA >60 02/15/2018 0940   Lab Results  Component Value Date   CHOL 134 05/26/2020   HDL 63 05/26/2020   LDLCALC 52 05/26/2020   TRIG 102 05/26/2020   CHOLHDL 3.8 02/27/2020   No results found for: "HGBA1C" Lab Results  Component Value Date   VITAMINB12 1,585 (H) 06/30/2021   Lab Results  Component Value Date   TSH 1.570 07/05/2021    ASSESSMENT AND PLAN 71 y.o. year old female   1.  Lumbar spinal stenosis, L2-3 level 2.  Leg cramps  -Continue Lyrica 100/200 mg daily, last fill was 09/29/20 90-day supply, takes tizanidine as needed -Encouraged to continue activity -Has not found ESI to be helpful, is not interested in surgical opinion, symptoms are stable for now, discussed red flag symptoms to watch for -Follow-up in 1 year or sooner if needed  Margie Ege, Edrick Oh, DNP 11/15/2021, 2:58 PM [facility] [address], [address] [place], [place] [address] [phone]

## 2021-11-16 ENCOUNTER — Encounter: Payer: Self-pay | Admitting: Neurology

## 2021-11-16 ENCOUNTER — Telehealth: Payer: Self-pay | Admitting: Neurology

## 2021-11-16 ENCOUNTER — Ambulatory Visit (INDEPENDENT_AMBULATORY_CARE_PROVIDER_SITE_OTHER): Payer: Medicare Other | Admitting: Neurology

## 2021-11-16 VITALS — BP 117/78 | HR 71 | Ht 67.0 in | Wt 204.0 lb

## 2021-11-16 DIAGNOSIS — M5442 Lumbago with sciatica, left side: Secondary | ICD-10-CM | POA: Diagnosis not present

## 2021-11-16 DIAGNOSIS — G8929 Other chronic pain: Secondary | ICD-10-CM

## 2021-11-16 DIAGNOSIS — M5416 Radiculopathy, lumbar region: Secondary | ICD-10-CM

## 2021-11-16 DIAGNOSIS — M48062 Spinal stenosis, lumbar region with neurogenic claudication: Secondary | ICD-10-CM

## 2021-11-16 NOTE — Telephone Encounter (Signed)
Referral for Neurosurgery sent to [facility] [phone]. 

## 2021-11-16 NOTE — Patient Instructions (Addendum)
Orders Placed This Encounter  Procedures   Ambulatory referral to Neurosurgery      

## 2021-11-22 DIAGNOSIS — J3081 Allergic rhinitis due to animal (cat) (dog) hair and dander: Secondary | ICD-10-CM | POA: Diagnosis not present

## 2021-11-22 DIAGNOSIS — J301 Allergic rhinitis due to pollen: Secondary | ICD-10-CM | POA: Diagnosis not present

## 2021-11-22 DIAGNOSIS — J3089 Other allergic rhinitis: Secondary | ICD-10-CM | POA: Diagnosis not present

## 2021-11-24 DIAGNOSIS — K6389 Other specified diseases of intestine: Secondary | ICD-10-CM | POA: Diagnosis not present

## 2021-11-24 DIAGNOSIS — K5730 Diverticulosis of large intestine without perforation or abscess without bleeding: Secondary | ICD-10-CM | POA: Diagnosis not present

## 2021-11-24 DIAGNOSIS — Z9884 Bariatric surgery status: Secondary | ICD-10-CM | POA: Diagnosis not present

## 2021-11-24 DIAGNOSIS — K648 Other hemorrhoids: Secondary | ICD-10-CM | POA: Diagnosis not present

## 2021-11-24 DIAGNOSIS — K219 Gastro-esophageal reflux disease without esophagitis: Secondary | ICD-10-CM | POA: Diagnosis not present

## 2021-11-24 DIAGNOSIS — Z9049 Acquired absence of other specified parts of digestive tract: Secondary | ICD-10-CM | POA: Diagnosis not present

## 2021-11-24 DIAGNOSIS — K449 Diaphragmatic hernia without obstruction or gangrene: Secondary | ICD-10-CM | POA: Diagnosis not present

## 2021-11-24 DIAGNOSIS — K649 Unspecified hemorrhoids: Secondary | ICD-10-CM | POA: Diagnosis not present

## 2021-11-24 DIAGNOSIS — K639 Disease of intestine, unspecified: Secondary | ICD-10-CM | POA: Diagnosis not present

## 2021-11-30 DIAGNOSIS — M545 Low back pain, unspecified: Secondary | ICD-10-CM | POA: Diagnosis not present

## 2021-11-30 DIAGNOSIS — Z6831 Body mass index (BMI) 31.0-31.9, adult: Secondary | ICD-10-CM | POA: Diagnosis not present

## 2021-11-30 DIAGNOSIS — G8929 Other chronic pain: Secondary | ICD-10-CM | POA: Diagnosis not present

## 2021-12-06 ENCOUNTER — Other Ambulatory Visit (HOSPITAL_BASED_OUTPATIENT_CLINIC_OR_DEPARTMENT_OTHER): Payer: Self-pay

## 2021-12-06 DIAGNOSIS — E559 Vitamin D deficiency, unspecified: Secondary | ICD-10-CM

## 2021-12-06 MED ORDER — ERGOCALCIFEROL 1.25 MG (50000 UT) PO CAPS: 50000.0000 [IU] | ORAL_CAPSULE | ORAL | 3 refills | Status: AC

## 2021-12-12 DIAGNOSIS — J3089 Other allergic rhinitis: Secondary | ICD-10-CM | POA: Diagnosis not present

## 2021-12-12 DIAGNOSIS — J301 Allergic rhinitis due to pollen: Secondary | ICD-10-CM | POA: Diagnosis not present

## 2021-12-12 DIAGNOSIS — J3081 Allergic rhinitis due to animal (cat) (dog) hair and dander: Secondary | ICD-10-CM | POA: Diagnosis not present

## 2021-12-12 DIAGNOSIS — N3020 Other chronic cystitis without hematuria: Secondary | ICD-10-CM | POA: Diagnosis not present

## 2021-12-26 ENCOUNTER — Telehealth: Payer: Self-pay | Admitting: Cardiovascular Disease

## 2021-12-26 NOTE — Telephone Encounter (Signed)
Pt c/o of Chest Pain: STAT if CP now or developed within 24 hours  1. Are you having CP right now? no  2. Are you experiencing any other symptoms (ex. SOB, nausea, vomiting, sweating)? Fatigue and stomach aches  3. How long have you been experiencing CP? Since last Friday on and off   4. Is your CP continuous or coming and going? Coming and going   5. Have you taken Nitroglycerin? no   Pt called complaining of chest pain, stomach ache, and fatigue. Pt states it has been going on since last Friday and it comes and goes. Pt made an appt with  Bhagat 12/28/21 at 8:00

## 2021-12-26 NOTE — Telephone Encounter (Addendum)
Pt c/o of chest pain that comes and goes since Friday.Rates pain 5/10. States that she is more fatigue than normal since Friday. Does not c/o increased nausea and vomiting. Pt does c/o some SOB and dizziness. She states that the pain is not associated with movement. Has an appt on 12/28/21 with Bhagat. Please advise.

## 2021-12-27 NOTE — Telephone Encounter (Signed)
Just seen    Has appt tomorrow   12/28/21

## 2021-12-28 ENCOUNTER — Telehealth (HOSPITAL_COMMUNITY): Payer: Self-pay | Admitting: *Deleted

## 2021-12-28 ENCOUNTER — Encounter: Payer: Self-pay | Admitting: Physician Assistant

## 2021-12-28 ENCOUNTER — Ambulatory Visit (INDEPENDENT_AMBULATORY_CARE_PROVIDER_SITE_OTHER): Payer: Medicare Other

## 2021-12-28 ENCOUNTER — Ambulatory Visit (INDEPENDENT_AMBULATORY_CARE_PROVIDER_SITE_OTHER): Payer: Medicare Other | Admitting: Physician Assistant

## 2021-12-28 VITALS — BP 124/60 | HR 67 | Ht 67.0 in | Wt 205.0 lb

## 2021-12-28 DIAGNOSIS — I2510 Atherosclerotic heart disease of native coronary artery without angina pectoris: Secondary | ICD-10-CM | POA: Diagnosis not present

## 2021-12-28 DIAGNOSIS — R079 Chest pain, unspecified: Secondary | ICD-10-CM | POA: Diagnosis not present

## 2021-12-28 DIAGNOSIS — R002 Palpitations: Secondary | ICD-10-CM | POA: Diagnosis not present

## 2021-12-28 DIAGNOSIS — J301 Allergic rhinitis due to pollen: Secondary | ICD-10-CM | POA: Diagnosis not present

## 2021-12-28 DIAGNOSIS — R072 Precordial pain: Secondary | ICD-10-CM

## 2021-12-28 DIAGNOSIS — J3089 Other allergic rhinitis: Secondary | ICD-10-CM | POA: Diagnosis not present

## 2021-12-28 DIAGNOSIS — J3081 Allergic rhinitis due to animal (cat) (dog) hair and dander: Secondary | ICD-10-CM | POA: Diagnosis not present

## 2021-12-28 NOTE — Telephone Encounter (Signed)
Patient given detailed instructions per Myocardial Perfusion Study Information Sheet for the test on 01/02/22 at 0730. Patient notified to arrive 15 minutes early and that it is imperative to arrive on time for appointment to keep from having the test rescheduled.  If you need to cancel or reschedule your appointment, please call the office within 24 hours of your appointment. . Patient verbalized understanding.Patricia Eaton, Adelene Idler

## 2021-12-28 NOTE — Patient Instructions (Addendum)
Medication Instructions:  Your physician recommends that you continue on your current medications as directed. Please refer to the Current Medication list given to you today. *If you need a refill on your cardiac medications before your next appointment, please call your pharmacy*   Lab Work: None Ordered If you have labs (blood work) drawn today and your tests are completely normal, you will receive your results only by: MyChart Message (if you have MyChart) OR A paper copy in the mail If you have any lab test that is abnormal or we need to change your treatment, we will call you to review the results.   Testing/Procedures: Your physician has requested that you have a lexiscan myoview. For further information please visit [email or web address] Please follow instruction sheet, as given.  ZIO XT- Long Term Monitor Instructions  Your physician has requested you wear a ZIO patch monitor for 14 days.  This is a single patch monitor. [employer] supplies one patch monitor per enrollment. Additional stickers are not available. Please do not apply patch if you will be having a Nuclear Stress Test,  Echocardiogram, Cardiac CT, MRI, or Chest Xray during the period you would be wearing the  monitor. The patch cannot be worn during these tests. You cannot remove and re-apply the  ZIO XT patch monitor.  Your ZIO patch monitor will be mailed 3 day USPS to your address on file. It may take 3-5 days  to receive your monitor after you have been enrolled.  Once you have received your monitor, please review the enclosed instructions. Your monitor  has already been registered assigning a specific monitor serial # to you.  Billing and Patient Assistance Program Information  We have supplied [employer] with any of your insurance information on file for billing purposes. [employer] offers a sliding scale Patient Assistance Program for patients that do not have  insurance, or whose insurance does not completely cover  the cost of the ZIO monitor.  You must apply for the Patient Assistance Program to qualify for this discounted rate.  To apply, please call [employer] at [phone], select option 4, select option 2, ask to apply for  Patient Assistance Program. Meredeth Ide will ask your household income, and how many people  are in your household. They will quote your out-of-pocket cost based on that information.  [employer] will also be able to set up a 84-month, interest-free payment plan if needed.  Applying the monitor   Shave hair from upper left chest.  Hold abrader disc by orange tab. Rub abrader in 40 strokes over the upper left chest as  indicated in your monitor instructions.  Clean area with 4 enclosed alcohol pads. Let dry.  Apply patch as indicated in monitor instructions. Patch will be placed under collarbone on left  side of chest with arrow pointing upward.  Rub patch adhesive wings for 2 minutes. Remove white label marked "1". Remove the white  label marked "2". Rub patch adhesive wings for 2 additional minutes.  While looking in a mirror, press and release button in center of patch. A small green light will  flash 3-4 times. This will be your only indicator that the monitor has been turned on.  Do not shower for the first 24 hours. You may shower after the first 24 hours.  Press the button if you feel a symptom. You will hear a small click. Record Date, Time and  Symptom in the Patient Logbook.  When you are ready to remove the patch, follow instructions on  the last 2 pages of Patient  Logbook. Stick patch monitor onto the last page of Patient Logbook.  Place Patient Logbook in the blue and white box. Use locking tab on box and tape box closed  securely. The blue and white box has prepaid postage on it. Please place it in the mailbox as  soon as possible. Your physician should have your test results approximately 7 days after the  monitor has been mailed back to [facility].  Call [facility] Customer Care at [phone] if you have questions regarding  your ZIO XT patch monitor. Call them immediately if you see an orange light blinking on your  monitor.  If your monitor falls off in less than 4 days, contact our Monitor department at [phone].  If your monitor becomes loose or falls off after 4 days call [employer] at [phone] for  suggestions on securing your monitor    Follow-Up: At [facility], you and your health needs are our priority.  As part of our continuing mission to provide you with exceptional heart care, we have created designated Provider Care Teams.  These Care Teams include your primary Cardiologist (physician) and Advanced Practice Providers (APPs -  Physician Assistants and Nurse Practitioners) who all work together to provide you with the care you need, when you need it.  We recommend signing up for the patient portal called "MyChart".  Sign up information is provided on this After Visit Summary.  MyChart is used to connect with patients for Virtual Visits (Telemedicine).  Patients are able to view lab/test results, encounter notes, upcoming appointments, etc.  Non-urgent messages can be sent to your provider as well.   To learn more about what you can do with MyChart, go to [email or web address].    Your next appointment:   4 week(s)  The format for your next appointment:   In Person  Provider:   Charlton Haws, MD  or Chelsea Aus, PA-C   Other Instructions   Important Information About Sugar

## 2021-12-28 NOTE — Progress Notes (Signed)
Cardiology Office Note:    Date:  12/28/2021   ID:  Patricia Eaton, DOB July 25, 1950, MRN [id number]  PCP:  de [place], Raymond J, MD  [facility] Cardiologist:  Jenkins Rouge, MD  [place] Electrophysiologist:  None   Chief Complaint: chest pain   History of Present Illness:    Patricia Eaton is a 71 y.o. female with a hx of gastric bypass surgery, GERD, remote pulmonary embolism, lumbar spinal stenosis with prior surgery, prior smoker (quit in 1999), family hx of CAD and Lyme disease added to schedule for chest pain evaluation.  Cardiac CTA 10/15/17 with calcium score 62 which was 74 th percentile no obstructive CAD isolated to LAD left dominant system. On ASA and Lipitor.   TTE 03/01/20 normal EF 60-65% no significant valve disease   Last seen by Dr. Johnsie Cancel 07/2021.  Patient is added to my schedule for chest pain evaluation.  She had intermittent stabbing left-sided chest pain underneath her breast.  Lasting for few minutes with some shortness of breath.  No radiation.  Denies associated diaphoresis.  Her symptoms started on Friday and since then it has improved.  Last episode this morning.  Also reported some palpitation but not associated with chest pain.  Noted heart rate jumping to 120 -130s on her Fitbit by myself.  Recently underwent colonoscopy and EGD as a routine follow-up which was reassuring per patient.  She denies orthopnea, PND, syncope or lower extremity edema.  Reports fatigue which is chronic.  No regular exercise but trying to be active.  She has no exertional chest pain.      Past Medical History:  Diagnosis Date   Allergy to alpha-gal    Angio-edema    Anxiety    Asthma    childhood asthma   Colonic mass    In [place], found inconclusive   Eczema    Environmental allergies    trees, grass, oak trees   GERD (gastroesophageal reflux disease)    Leg cramps 12/15/2019   Lumbar spinal stenosis 12/15/2019   L2-3 level   Lyme disease     Neuropathy    Numbness    Pulmonary embolism (HCC)    Seasonal allergies     Past Surgical History:  Procedure Laterality Date   APPENDECTOMY     COLONOSCOPY  2018   [place]: diverticulosis   ESOPHAGOGASTRODUODENOSCOPY N/A 01/09/2018   normal esophagus, normal residual stomach status post gastric obesity procedure.   GASTRIC BYPASS  2003   LUMBAR FUSION  2016   PARTIAL HYSTERECTOMY  1980   TONSILLECTOMY     WRIST FRACTURE SURGERY  2013    Current Medications: Current Meds  Medication Sig   Ascorbic Acid (VITAMIN C WITH ROSE HIPS) 1000 MG tablet Take 1,000 mg by mouth daily.   aspirin EC 81 MG tablet Take 81 mg by mouth daily.   atorvastatin (LIPITOR) 10 MG tablet TAKE ONE TABLET BY MOUTH EVERY DAY   CALCIUM PO Take 500 mg by mouth daily.    cephALEXin (KEFLEX) 250 MG capsule Take 250 mg by mouth daily.   CRANBERRY PO Take 1 tablet by mouth daily.   dicyclomine (BENTYL) 10 MG capsule Take 10 mg by mouth every 6 (six) hours as needed.   EPINEPHrine 0.3 mg/0.3 mL IJ SOAJ injection See admin instructions.   ergocalciferol (VITAMIN D2) 1.25 MG (50000 UT) capsule Take 1 capsule (50,000 Units total) by mouth once a week.   ferrous sulfate 325 (65 FE) MG tablet  Take 325 mg by mouth daily with breakfast.   hydrochlorothiazide (MICROZIDE) 12.5 MG capsule Take 1 capsule (12.5 mg total) by mouth daily as needed (for edema).   ketoconazole (NIZORAL) 2 % shampoo Apply to scalp twice a week for 8 weeks and then weekly thereafter   MAGNESIUM PO Take 1 tablet by mouth daily.   melatonin 3 MG TABS tablet Take 6 mg by mouth at bedtime.   Misc Natural Products (NEURIVA) CAPS See admin instructions.   ondansetron (ZOFRAN) 4 MG tablet Take 1 tablet (4 mg total) by mouth every 6 (six) hours as needed for nausea or vomiting.   pantoprazole (PROTONIX) 40 MG tablet Take 1 tablet by mouth daily.   polyethylene glycol (MIRALAX / GLYCOLAX) 17 g packet Take 17 g by mouth as needed for mild constipation  or moderate constipation.   pregabalin (LYRICA) 100 MG capsule TAKE 1 CAPSULE BY MOUTH IN THE MORNING AND TAKE 2 CAPSULES BY MOUTH AT NIGHT   PRESCRIPTION MEDICATION Allergy shot three times weekly   tiZANidine (ZANAFLEX) 4 MG tablet Take 1 tablet (4 mg total) by mouth daily as needed.     Allergies:   Molds & smuts and Other   Social History   Socioeconomic History   Marital status: Married    Spouse name: Not on file   Number of children: 3   Years of education: 12   Highest education level: High school graduate  Occupational History   Occupation: Retired from trucking  Tobacco Use   Smoking status: Former    Types: Cigarettes    Quit date: 05/29/1997    Years since quitting: 24.6   Smokeless tobacco: Never  Vaping Use   Vaping Use: Never used  Substance and Sexual Activity   Alcohol use: Yes    Comment: occasional   Drug use: Never   Sexual activity: Not on file  Other Topics Concern   Not on file  Social History Narrative   Lives at home with husband.  Married multiple times.  3 grown sons also grandchildren and step grandchildren.   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    Social Determinants of Health   Financial Resource Strain: Low Risk  (10/07/2021)   Overall Financial Resource Strain (CARDIA)    Difficulty of Paying Living Expenses: Not very hard  Food Insecurity: No Food Insecurity (10/07/2021)   Hunger Vital Sign    Worried About Running Out of Food in the Last Year: Never true    Ran Out of Food in the Last Year: Never true  Transportation Needs: No Transportation Needs (10/07/2021)   PRAPARE - Administrator, Civil Service (Medical): No    Lack of Transportation (Non-Medical): No  Physical Activity: Inactive (10/07/2021)   Exercise Vital Sign    Days of Exercise per Week: 0 days    Minutes of  Exercise per Session: 0 min  Stress: No Stress Concern Present (10/07/2021)   [employer] of Occupational Health - Occupational Stress Questionnaire    Feeling of Stress : Not at all  Social Connections: Socially Integrated (10/07/2021)   Social Connection and Isolation Panel [NHANES]    Frequency of Communication with Friends and Family: More than three times a week    Frequency of Social Gatherings with Friends and Family: Twice a week  Attends Religious Services: 1 to 4 times per year    Active Member of Clubs or Organizations: Yes    Attends Banker Meetings: 1 to 4 times per year    Marital Status: Married     Family History: The patient's family history includes Cancer in her mother; Colon cancer in her maternal aunt; Crohn's disease in her grandchild and son; Heart attack in her father; Heart failure in her father; Pancreatic cancer in her sister; Stroke in her maternal grandmother. There is no history of Allergic rhinitis, Asthma, Eczema, Urticaria, Esophageal cancer, or Stomach cancer.    ROS:   Please see the history of present illness.    All other systems reviewed and are negative.   EKGs/Labs/Other Studies Reviewed:    The following studies were reviewed today:  Echo 02/2020  1. Left ventricular ejection fraction, by estimation, is 60 to 65%. The  left ventricle has normal function. The left ventricle has no regional  wall motion abnormalities. Left ventricular diastolic parameters are  consistent with Grade I diastolic  dysfunction (impaired relaxation).   2. Right ventricular systolic function is normal. The right ventricular  size is normal.   3. The mitral valve is normal in structure. No evidence of mitral valve  regurgitation. No evidence of mitral stenosis.   4. The aortic valve has an indeterminant number of cusps. Aortic valve  regurgitation is not visualized. No aortic stenosis is present.   5. The inferior vena cava is dilated in size  with >50% respiratory  variability, suggesting right atrial pressure of 8 mmHg.   Coronary CT 09/2017 FINDINGS: Non-cardiac: See separate report from [facility].   Calcium Score: 62 Agatston units.   Coronary Arteries: Left dominant with no anomalies   LM: No plaque or stenosis.   LAD system: Calcified plaque proximal LAD, mild stenosis. Calcified plaque with mild stenosis mid LAD near take-off of D1. Medium-sized D1 without significant disease.   Circumflex system: Large vessel, provides left PDA. No plaque or stenosis.   RCA system: Small, nondominant vessel. Calcified plaque at the ostium, mild stenosis.   IMPRESSION: 1. Coronary artery calcium score 62 Agatston units. This places the patient in the 74th percentile for age and gender, suggesting intermediate risk for future cardiac events.   2.  Nonobstructive coronary disease noted.   Dalton Mclean  EKG:  EKG is  ordered today.  The ekg ordered today demonstrates normal sinus rhythm  Recent Labs: 07/05/2021: Hemoglobin 12.9; Platelets 231; TSH 1.570 11/02/2021: ALT 14; BUN 20; Creatinine, Ser 0.76; Potassium 4.9; Sodium 142  Recent Lipid Panel    Component Value Date/Time   CHOL 134 05/26/2020 0853   TRIG 102 05/26/2020 0853   HDL 63 05/26/2020 0853   CHOLHDL 3.8 02/27/2020 0000   LDLCALC 52 05/26/2020 0853     Physical Exam:    VS:  BP 124/60   Pulse 67   Ht 5\' 7"  (1.702 m)   Wt 205 lb (93 kg)   SpO2 95%   BMI 32.11 kg/m     Wt Readings from Last 3 Encounters:  12/28/21 205 lb (93 kg)  11/16/21 204 lb (92.5 kg)  11/02/21 205 lb 8 oz (93.2 kg)     GEN:  Well nourished, well developed in no acute distress HEENT: Normal NECK: No JVD; No carotid bruits LYMPHATICS: No lymphadenopathy CARDIAC: RRR, no murmurs, rubs, gallops RESPIRATORY:  Clear to auscultation without rales, wheezing or rhonchi  ABDOMEN: Soft, non-tender, non-distended MUSCULOSKELETAL:  No edema; No deformity  SKIN: Warm and  dry NEUROLOGIC:  Alert and oriented x 3 PSYCHIATRIC:  Normal affect   ASSESSMENT AND PLAN:    Chest pain Improving.  Sounds atypical.  She has tender spot underneath left axilla by palpation however this is different than chest pain episodes.  She is worried given her father died of MI.  After long discussion mutual agreement to perform stress test. Coronary CT in 2019 as above. Continue ASA and statin.   2.  Palpitation Not associated with chest pain episode.  Heart rate occasionally in the 120s to 130s on Fitbit reading.  Get ZIO monitor.  Shared Decision Making/Informed Consent The risks [chest pain, shortness of breath, cardiac arrhythmias, dizziness, blood pressure fluctuations, myocardial infarction, stroke/transient ischemic attack, nausea, vomiting, allergic reaction, radiation exposure, metallic taste sensation and life-threatening complications (estimated to be 1 in 10,000)], benefits (risk stratification, diagnosing coronary artery disease, treatment guidance) and alternatives of a nuclear stress test were discussed in detail with Patricia Eaton and she agrees to proceed.    Medication Adjustments/Labs and Tests Ordered: Current medicines are reviewed at length with the patient today.  Concerns regarding medicines are outlined above.  Orders Placed This Encounter  Procedures   LONG TERM MONITOR (3-14 DAYS)   MYOCARDIAL PERFUSION IMAGING   EKG 12-Lead   No orders of the defined types were placed in this encounter.   Patient Instructions  Medication Instructions:  Your physician recommends that you continue on your current medications as directed. Please refer to the Current Medication list given to you today. *If you need a refill on your cardiac medications before your next appointment, please call your pharmacy*   Lab Work: None Ordered If you have labs (blood work) drawn today and your tests are completely normal, you will receive your results only by: MyChart  Message (if you have MyChart) OR A paper copy in the mail If you have any lab test that is abnormal or we need to change your treatment, we will call you to review the results.   Testing/Procedures: Your physician has requested that you have a lexiscan myoview. For further information please visit [email or web address] Please follow instruction sheet, as given.  ZIO XT- Long Term Monitor Instructions  Your physician has requested you wear a ZIO patch monitor for 14 days.  This is a single patch monitor. [employer] supplies one patch monitor per enrollment. Additional stickers are not available. Please do not apply patch if you will be having a Nuclear Stress Test,  Echocardiogram, Cardiac CT, MRI, or Chest Xray during the period you would be wearing the  monitor. The patch cannot be worn during these tests. You cannot remove and re-apply the  ZIO XT patch monitor.  Your ZIO patch monitor will be mailed 3 day USPS to your address on file. It may take 3-5 days  to receive your monitor after you have been enrolled.  Once you have received your monitor, please review the enclosed instructions. Your monitor  has already been registered assigning a specific monitor serial # to you.  Billing and Patient Assistance Program Information  We have supplied [employer] with any of your insurance information on file for billing purposes. [employer] offers a sliding scale Patient Assistance Program for patients that do not have  insurance, or whose insurance does not completely cover the cost of the ZIO monitor.  You must apply for the Patient Assistance Program to qualify for this discounted rate.  To apply, please call [employer]  at [phone], select option 4, select option 2, ask to apply for  Patient Assistance Program. Theodore Demark will ask your household income, and how many people  are in your household. They will quote your out-of-pocket cost based on that information.  [employer] will also be able to set up a  38-month, interest-free payment plan if needed.  Applying the monitor   Shave hair from upper left chest.  Hold abrader disc by orange tab. Rub abrader in 40 strokes over the upper left chest as  indicated in your monitor instructions.  Clean area with 4 enclosed alcohol pads. Let dry.  Apply patch as indicated in monitor instructions. Patch will be placed under collarbone on left  side of chest with arrow pointing upward.  Rub patch adhesive wings for 2 minutes. Remove white label marked "1". Remove the white  label marked "2". Rub patch adhesive wings for 2 additional minutes.  While looking in a mirror, press and release button in center of patch. A small green light will  flash 3-4 times. This will be your only indicator that the monitor has been turned on.  Do not shower for the first 24 hours. You may shower after the first 24 hours.  Press the button if you feel a symptom. You will hear a small click. Record Date, Time and  Symptom in the Patient Logbook.  When you are ready to remove the patch, follow instructions on the last 2 pages of Patient  Logbook. Stick patch monitor onto the last page of Patient Logbook.  Place Patient Logbook in the blue and white box. Use locking tab on box and tape box closed  securely. The blue and white box has prepaid postage on it. Please place it in the mailbox as  soon as possible. Your physician should have your test results approximately 7 days after the  monitor has been mailed back to [facility].  Call [facility] at [phone] if you have questions regarding  your ZIO XT patch monitor. Call them immediately if you see an orange light blinking on your  monitor.  If your monitor falls off in less than 4 days, contact our Monitor department at [phone].  If your monitor becomes loose or falls off after 4 days call [employer] at [phone] for  suggestions on securing your monitor    Follow-Up: At [facility],  you and your health needs are our priority.  As part of our continuing mission to provide you with exceptional heart care, we have created designated Provider Care Teams.  These Care Teams include your primary Cardiologist (physician) and Advanced Practice Providers (APPs -  Physician Assistants and Nurse Practitioners) who all work together to provide you with the care you need, when you need it.  We recommend signing up for the patient portal called "MyChart".  Sign up information is provided on this After Visit Summary.  MyChart is used to connect with patients for Virtual Visits (Telemedicine).  Patients are able to view lab/test results, encounter notes, upcoming appointments, etc.  Non-urgent messages can be sent to your provider as well.   To learn more about what you can do with MyChart, go to [email or web address].    Your next appointment:   4 week(s)  The format for your next appointment:   In Person  Provider:   Jenkins Rouge, MD  or Robbie Lis, PA-C   Other Instructions   Important Information About Sugar         Signed, Leanor Kail,  PA  12/28/2021 9:02 AM    [facility]

## 2022-01-02 ENCOUNTER — Ambulatory Visit (HOSPITAL_COMMUNITY): Payer: Medicare Other | Attending: Physician Assistant

## 2022-01-02 DIAGNOSIS — R002 Palpitations: Secondary | ICD-10-CM

## 2022-01-02 DIAGNOSIS — R072 Precordial pain: Secondary | ICD-10-CM | POA: Insufficient documentation

## 2022-01-02 DIAGNOSIS — R079 Chest pain, unspecified: Secondary | ICD-10-CM | POA: Insufficient documentation

## 2022-01-02 LAB — MYOCARDIAL PERFUSION IMAGING
LV dias vol: 56 mL (ref 46–106)
LV sys vol: 15 mL
Nuc Stress EF: 73 %
Peak HR: 93 {beats}/min
Rest HR: 67 {beats}/min
Rest Nuclear Isotope Dose: 10.7 mCi
SDS: 0
SRS: 0
SSS: 0
ST Depression (mm): 0 mm
Stress Nuclear Isotope Dose: 30.5 mCi
TID: 0.89

## 2022-01-02 MED ORDER — TECHNETIUM TC 99M TETROFOSMIN IV KIT
30.5000 | PACK | Freq: Once | INTRAVENOUS | Status: AC | PRN
  Administered 2022-01-02: 30.5 via INTRAVENOUS

## 2022-01-02 MED ORDER — TECHNETIUM TC 99M TETROFOSMIN IV KIT
722.0000 | PACK | Freq: Once | INTRAVENOUS | Status: AC | PRN
  Administered 2022-01-02: 10.7 via INTRAVENOUS

## 2022-01-02 MED ORDER — REGADENOSON 0.4 MG/5ML IV SOLN
0.4000 mg | Freq: Once | INTRAVENOUS | Status: AC
  Administered 2022-01-02: 0.4 mg via INTRAVENOUS

## 2022-01-09 DIAGNOSIS — R350 Frequency of micturition: Secondary | ICD-10-CM | POA: Diagnosis not present

## 2022-01-09 DIAGNOSIS — J3089 Other allergic rhinitis: Secondary | ICD-10-CM | POA: Diagnosis not present

## 2022-01-09 DIAGNOSIS — J3081 Allergic rhinitis due to animal (cat) (dog) hair and dander: Secondary | ICD-10-CM | POA: Diagnosis not present

## 2022-01-09 DIAGNOSIS — J301 Allergic rhinitis due to pollen: Secondary | ICD-10-CM | POA: Diagnosis not present

## 2022-01-24 ENCOUNTER — Telehealth: Payer: Self-pay | Admitting: Neurology

## 2022-01-24 NOTE — Addendum Note (Signed)
Addended by: Ann Maki on: 01/24/2022 10:05 AM   Modules accepted: Orders

## 2022-01-24 NOTE — Telephone Encounter (Signed)
Pt is calling and requesting Maralyn Sago send her a referral to a Infectious disease clinic.

## 2022-01-24 NOTE — Addendum Note (Signed)
Addended by: Ann Maki on: 01/24/2022 01:10 PM   Modules accepted: Orders

## 2022-01-24 NOTE — Progress Notes (Deleted)
Office Visit    Patient Name: Patricia Eaton Date of Encounter: 01/24/2022  Primary Care Provider:  [place], Buren Kos, MD Primary Cardiologist:  Charlton Haws, MD Primary Electrophysiologist: None  Chief Complaint    Patricia Eaton is a 71 y.o. female with PMH of gastric bypass, GERD, pulmonary embolism, spinal stenosis, family history of CAD, Lyme disease who presents today for chest pain 4-week follow-up. Past Medical History    Past Medical History:  Diagnosis Date   Allergy to alpha-gal    Angio-edema    Anxiety    Asthma    childhood asthma   Colonic mass    In [place], found inconclusive   Eczema    Environmental allergies    trees, grass, oak trees   GERD (gastroesophageal reflux disease)    Leg cramps 12/15/2019   Lumbar spinal stenosis 12/15/2019   L2-3 level   Lyme disease    Neuropathy    Numbness    Pulmonary embolism (HCC)    Seasonal allergies    Past Surgical History:  Procedure Laterality Date   APPENDECTOMY     COLONOSCOPY  2018   [place]: diverticulosis   ESOPHAGOGASTRODUODENOSCOPY N/A 01/09/2018   normal esophagus, normal residual stomach status post gastric obesity procedure.   GASTRIC BYPASS  2003   LUMBAR FUSION  2016   PARTIAL HYSTERECTOMY  1980   TONSILLECTOMY     WRIST FRACTURE SURGERY  2013    Allergies  Allergies  Allergen Reactions   Molds & Smuts Cough    And grass; cause sneezing and watery eyes, too   Other Rash    Oak causes rash    History of Present Illness    Patricia Eaton is a 71 year old female with the above-mentioned past medical history who presents today for 4-week follow-up of chest pain.  She was recently seen by Chelsea Aus, PA on 12/28/2021 for complaint of chest pain.  She had previous cardiac CTA completed in 09/2017 with calcium score of 62 which showed no obstructive CAD and isolated LAD left dominance.  2D echo was completed 02/2020 with normal EF and no significant valve  disease.  Patient described intermittent stabbing left-sided chest pain underneath her breast.  She also endorsed some shortness of breath but stated no radiation of pain or diaphoresis.  She was provided stress test that revealed low risk with no ST deviation or changes from previous study.  She was also given ZIO monitor to quantify accelerated heart rate in the 120s and 30s that showed...     Since last being seen in the office patient reports***.  Patient denies chest pain, palpitations, dyspnea, PND, orthopnea, nausea, vomiting, dizziness, syncope, edema, weight gain, or early satiety.     ***Notes: Need to review ZIO monitor results not completed yet Home Medications    Current Outpatient Medications  Medication Sig Dispense Refill   Ascorbic Acid (VITAMIN C WITH ROSE HIPS) 1000 MG tablet Take 1,000 mg by mouth daily.     aspirin EC 81 MG tablet Take 81 mg by mouth daily.     atorvastatin (LIPITOR) 10 MG tablet TAKE ONE TABLET BY MOUTH EVERY DAY 90 tablet 3   CALCIUM PO Take 500 mg by mouth daily.      cephALEXin (KEFLEX) 250 MG capsule Take 250 mg by mouth daily.     CRANBERRY PO Take 1 tablet by mouth daily.     dicyclomine (BENTYL) 10 MG capsule Take 10 mg by  mouth every 6 (six) hours as needed.     EPINEPHrine 0.3 mg/0.3 mL IJ SOAJ injection See admin instructions.     ergocalciferol (VITAMIN D2) 1.25 MG (50000 UT) capsule Take 1 capsule (50,000 Units total) by mouth once a week. 12 capsule 3   ferrous sulfate 325 (65 FE) MG tablet Take 325 mg by mouth daily with breakfast.     hydrochlorothiazide (MICROZIDE) 12.5 MG capsule Take 1 capsule (12.5 mg total) by mouth daily as needed (for edema). 90 capsule 3   ketoconazole (NIZORAL) 2 % shampoo Apply to scalp twice a week for 8 weeks and then weekly thereafter 120 mL 1   MAGNESIUM PO Take 1 tablet by mouth daily.     melatonin 3 MG TABS tablet Take 6 mg by mouth at bedtime.     Misc Natural Products (NEURIVA) CAPS See admin  instructions.     ondansetron (ZOFRAN) 4 MG tablet Take 1 tablet (4 mg total) by mouth every 6 (six) hours as needed for nausea or vomiting. 30 tablet 1   pantoprazole (PROTONIX) 40 MG tablet Take 1 tablet by mouth daily.     polyethylene glycol (MIRALAX / GLYCOLAX) 17 g packet Take 17 g by mouth as needed for mild constipation or moderate constipation.     pregabalin (LYRICA) 100 MG capsule TAKE 1 CAPSULE BY MOUTH IN THE MORNING AND TAKE 2 CAPSULES BY MOUTH AT NIGHT 270 capsule 1   PRESCRIPTION MEDICATION Allergy shot three times weekly     tiZANidine (ZANAFLEX) 4 MG tablet Take 1 tablet (4 mg total) by mouth daily as needed. 30 tablet 1   No current facility-administered medications for this visit.     Review of Systems  Please see the history of present illness.    (+)*** (+)***  All other systems reviewed and are otherwise negative except as noted above.  Physical Exam    Wt Readings from Last 3 Encounters:  01/02/22 205 lb (93 kg)  12/28/21 205 lb (93 kg)  11/16/21 204 lb (92.5 kg)   SW:NIOEV were no vitals filed for this visit.,There is no height or weight on file to calculate BMI.  Constitutional:      Appearance: Healthy appearance. Not in distress.  Neck:     Vascular: JVD normal.  Pulmonary:     Effort: Pulmonary effort is normal.     Breath sounds: No wheezing. No rales. Diminished in the bases Cardiovascular:     Normal rate. Regular rhythm. Normal S1. Normal S2.      Murmurs: There is no murmur.  Edema:    Peripheral edema absent.  Abdominal:     Palpations: Abdomen is soft non tender. There is no hepatomegaly.  Skin:    General: Skin is warm and dry.  Neurological:     General: No focal deficit present.     Mental Status: Alert and oriented to person, place and time.     Cranial Nerves: Cranial nerves are intact.  EKG/LABS/Other Studies Reviewed    ECG personally reviewed by me today - ***  Risk Assessment/Calculations:   {Does this patient have  ATRIAL FIBRILLATION?:[phone]}        Lab Results  Component Value Date   WBC 5.6 07/05/2021   HGB 12.9 07/05/2021   HCT 38.9 07/05/2021   MCV 89 07/05/2021   PLT 231 07/05/2021   Lab Results  Component Value Date   CREATININE 0.76 11/02/2021   BUN 20 11/02/2021   NA 142 11/02/2021  K 4.9 11/02/2021   CL 101 11/02/2021   CO2 24 11/02/2021   Lab Results  Component Value Date   ALT 14 11/02/2021   AST 19 11/02/2021   GGT 12 11/02/2021   ALKPHOS 146 (H) 11/02/2021   BILITOT 0.3 11/02/2021   Lab Results  Component Value Date   CHOL 134 05/26/2020   HDL 63 05/26/2020   LDLCALC 52 05/26/2020   TRIG 102 05/26/2020   CHOLHDL 3.8 02/27/2020    No results found for: "HGBA1C"  Assessment & Plan    1.  History of nonobstructive CAD: -cardiac CTA completed in 09/2017 with calcium score of 62 which showed no obstructive CAD  2.  Chest pain: -Patient reports***  3.  Palpitations: -14 day Zio monitor completed with results pending  4.***      Disposition: Follow-up with Charlton Haws, MD or APP in *** months {Are you ordering a CV Procedure (e.g. stress test, cath, DCCV, TEE, etc)?   Press F2        :[id number]}   Medication Adjustments/Labs and Tests Ordered: Current medicines are reviewed at length with the patient today.  Concerns regarding medicines are outlined above.   Signed, Napoleon Form, Leodis Rains, NP 01/24/2022, 4:10 PM [facility]

## 2022-01-24 NOTE — Telephone Encounter (Signed)
Pt notified of this.

## 2022-01-24 NOTE — Telephone Encounter (Addendum)
I called the pt. She sts at last visit she discussed with Maralyn Sago options on being referred to Rheumatology or Infections Disease. She has called diffrent clinics and would like to proceed with the infections disease [facility] in [place] # [phone].  Will send to NP as FYI and signature.

## 2022-01-27 ENCOUNTER — Ambulatory Visit: Payer: Medicare Other | Admitting: Nurse Practitioner

## 2022-01-27 DIAGNOSIS — R072 Precordial pain: Secondary | ICD-10-CM | POA: Diagnosis not present

## 2022-01-27 DIAGNOSIS — R002 Palpitations: Secondary | ICD-10-CM | POA: Diagnosis not present

## 2022-02-01 ENCOUNTER — Other Ambulatory Visit: Payer: Self-pay

## 2022-02-02 ENCOUNTER — Ambulatory Visit (HOSPITAL_BASED_OUTPATIENT_CLINIC_OR_DEPARTMENT_OTHER): Payer: Medicare Other | Admitting: Family Medicine

## 2022-02-02 ENCOUNTER — Telehealth (HOSPITAL_BASED_OUTPATIENT_CLINIC_OR_DEPARTMENT_OTHER): Payer: Self-pay | Admitting: Family Medicine

## 2022-02-02 ENCOUNTER — Other Ambulatory Visit: Payer: Self-pay

## 2022-02-02 DIAGNOSIS — A6920 Lyme disease, unspecified: Secondary | ICD-10-CM

## 2022-02-02 MED ORDER — METOPROLOL TARTRATE 25 MG PO TABS: 12.5000 mg | ORAL_TABLET | Freq: Two times a day (BID) | ORAL | 3 refills | Status: DC

## 2022-02-02 NOTE — Telephone Encounter (Signed)
Pt is calling wanting a referral to infectious diseases with [facility]

## 2022-02-06 NOTE — Progress Notes (Unsigned)
Cardiology Office Note:    Date:  02/07/2022   ID:  ERMEL VANBEEK, DOB 1950-12-09, MRN [id number]  PCP:  de [place], Raymond J, MD  [facility] Cardiologist:  Jenkins Rouge, MD  [facility] Electrophysiologist:  None   Chief Complaint: 6 weeks follow up   History of Present Illness:    Patricia Eaton is a 71 y.o. female with a hx of bypass surgery, GERD, remote pulmonary embolism, lumbar spinal stenosis with prior surgery, prior smoker (quit in 1999), family hx of CAD and Lyme disease seen for follow up.   Cardiac CTA 10/15/17 with calcium score 62 which was 74 th percentile no obstructive CAD isolated to LAD left dominant system. On ASA and Lipitor.    TTE 03/01/20 normal EF 60-65% no significant valve disease   Seen by me 12/28/21 for chest pain. Felt atypical. TTP underneath left axilla. Some palpitations. Follow up stress test was low risk. Monitor with mostly sinus rhythm. Rate SVT/Atachy. Trial of low dose BB.   Seen for follow up.  No recurrent chest pain.  Not much improved fluttering sensation despite beta-blocker.  Feels lack of energy.  No typical dyspnea on exertion.  No syncope, orthopnea, PND, lower extremity edema or melena.   Past Medical History:  Diagnosis Date   Allergy to alpha-gal    Angio-edema    Anxiety    Asthma    childhood asthma   Colonic mass    In [place], found inconclusive   Eczema    Environmental allergies    trees, grass, oak trees   GERD (gastroesophageal reflux disease)    Leg cramps 12/15/2019   Lumbar spinal stenosis 12/15/2019   L2-3 level   Lyme disease    Neuropathy    Numbness    Pulmonary embolism (HCC)    Seasonal allergies     Past Surgical History:  Procedure Laterality Date   APPENDECTOMY     COLONOSCOPY  2018   [place]: diverticulosis   ESOPHAGOGASTRODUODENOSCOPY N/A 01/09/2018   normal esophagus, normal residual stomach status post gastric obesity procedure.   GASTRIC BYPASS  2003   LUMBAR  FUSION  2016   PARTIAL HYSTERECTOMY  1980   TONSILLECTOMY     WRIST FRACTURE SURGERY  2013    Current Medications: Current Meds  Medication Sig   Ascorbic Acid (VITAMIN C WITH ROSE HIPS) 1000 MG tablet Take 1,000 mg by mouth daily.   aspirin EC 81 MG tablet Take 81 mg by mouth daily.   atorvastatin (LIPITOR) 10 MG tablet TAKE ONE TABLET BY MOUTH EVERY DAY   CALCIUM PO Take 500 mg by mouth daily.    cephALEXin (KEFLEX) 250 MG capsule Take 250 mg by mouth daily.   CRANBERRY PO Take 1 tablet by mouth daily.   dicyclomine (BENTYL) 10 MG capsule Take 10 mg by mouth every 6 (six) hours as needed.   EPINEPHrine 0.3 mg/0.3 mL IJ SOAJ injection See admin instructions.   ergocalciferol (VITAMIN D2) 1.25 MG (50000 UT) capsule Take 1 capsule (50,000 Units total) by mouth once a week.   ferrous sulfate 325 (65 FE) MG tablet Take 325 mg by mouth daily with breakfast.   hydrochlorothiazide (MICROZIDE) 12.5 MG capsule Take 1 capsule (12.5 mg total) by mouth daily as needed (for edema).   ketoconazole (NIZORAL) 2 % shampoo Apply to scalp twice a week for 8 weeks and then weekly thereafter   MAGNESIUM PO Take 1 tablet by mouth daily.   melatonin 3  MG TABS tablet Take 6 mg by mouth at bedtime.   metoprolol tartrate (LOPRESSOR) 25 MG tablet Take 0.5 tablets (12.5 mg total) by mouth 2 (two) times daily.   Misc Natural Products (NEURIVA) CAPS See admin instructions.   ondansetron (ZOFRAN) 4 MG tablet Take 1 tablet (4 mg total) by mouth every 6 (six) hours as needed for nausea or vomiting.   pantoprazole (PROTONIX) 40 MG tablet Take 1 tablet by mouth daily.   polyethylene glycol (MIRALAX / GLYCOLAX) 17 g packet Take 17 g by mouth as needed for mild constipation or moderate constipation.   pregabalin (LYRICA) 100 MG capsule TAKE 1 CAPSULE BY MOUTH IN THE MORNING AND TAKE 2 CAPSULES BY MOUTH AT NIGHT   PRESCRIPTION MEDICATION Allergy shot three times weekly   tiZANidine (ZANAFLEX) 4 MG tablet Take 1 tablet (4  mg total) by mouth daily as needed.     Allergies:   Molds & smuts and Other   Social History   Socioeconomic History   Marital status: Married    Spouse name: Not on file   Number of children: 3   Years of education: 12   Highest education level: High school graduate  Occupational History   Occupation: Retired from trucking  Tobacco Use   Smoking status: Former    Types: Cigarettes    Quit date: 05/29/1997    Years since quitting: 24.7   Smokeless tobacco: Never  Vaping Use   Vaping Use: Never used  Substance and Sexual Activity   Alcohol use: Yes    Comment: occasional   Drug use: Never   Sexual activity: Not on file  Other Topics Concern   Not on file  Social History Narrative   Lives at home with husband.  Married multiple times.  3 grown sons also grandchildren and step grandchildren.   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    Social Determinants of Health   Financial Resource Strain: Low Risk  (10/07/2021)   Overall Financial Resource Strain (CARDIA)    Difficulty of Paying Living Expenses: Not very hard  Food Insecurity: No Food Insecurity (10/07/2021)   Hunger Vital Sign    Worried About Running Out of Food in the Last Year: Never true    Ran Out of Food in the Last Year: Never true  Transportation Needs: No Transportation Needs (10/07/2021)   PRAPARE - Administrator, Civil Service (Medical): No    Lack of Transportation (Non-Medical): No  Physical Activity: Inactive (10/07/2021)   Exercise Vital Sign    Days of Exercise per Week: 0 days    Minutes of Exercise per Session: 0 min  Stress: No Stress Concern Present (10/07/2021)   [employer] of Occupational Health - Occupational Stress Questionnaire    Feeling of Stress : Not at all  Social Connections: Socially Integrated (10/07/2021)   Social  Connection and Isolation Panel [NHANES]    Frequency of Communication with Friends and Family: More than three times a week    Frequency of Social Gatherings with Friends and Family: Twice a week    Attends Religious Services: 1 to 4 times per year    Active Member of [employer] or Organizations: Yes    Attends Banker Meetings: 1 to 4 times per year    Marital Status: Married  Family History: The patient's family history includes Cancer in her mother; Colon cancer in her maternal aunt; Crohn's disease in her grandchild and son; Heart attack in her father; Heart failure in her father; Pancreatic cancer in her sister; Stroke in her maternal grandmother. There is no history of Allergic rhinitis, Asthma, Eczema, Urticaria, Esophageal cancer, or Stomach cancer.    ROS:   Please see the history of present illness.    All other systems reviewed and are negative.   EKGs/Labs/Other Studies Reviewed:    The following studies were reviewed today:  Monitor 01/31/2022 Patch Wear Time:  12 days and 3 hours (2023-08-07T10:17:47-0400 to 2023-08-19T13:56:26-0400)   Patient had a min HR of 52 bpm, max HR of 157 bpm, and avg HR of 81 bpm. Predominant underlying rhythm was Sinus Rhythm. 5 Supraventricular Tachycardia runs occurred, the run with the fastest interval lasting 4 beats with a max rate of 143 bpm, the  longest lasting 8 beats with an avg rate of 130 bpm. Some episodes of Supraventricular Tachycardia may be possible Atrial Tachycardia with variable block. Isolated SVEs were rare (<1.0%), SVE Couplets were rare (<1.0%), and SVE Triplets were rare  (<1.0%). Isolated VEs were rare (<1.0%, 288), VE Triplets were rare (<1.0%, 1), and no VE Couplets were present. Ventricular Bigeminy was present.   Charlton Haws MD [facility]  Stress test 12/2021 The study is normal. The study is low risk.   No ST deviation was noted.   Left ventricular function is normal. Nuclear stress EF: 73 %. The left  ventricular ejection fraction is hyperdynamic (>65%). End diastolic cavity size is normal. End systolic cavity size is normal.   Prior study not available for comparison.   IMPRESSIONS Negative for stress induced arrhythmias.  Stress ECG nondiagnostic due to pharmacologic protocol. Normal left ventricular function and size. No perfusion defects.    CONCLUSIONS Negative stress test. Low risk.  EKG:  EKG is not  ordered today.   Recent Labs: 07/05/2021: Hemoglobin 12.9; Platelets 231; TSH 1.570 11/02/2021: ALT 14; BUN 20; Creatinine, Ser 0.76; Potassium 4.9; Sodium 142  Recent Lipid Panel    Component Value Date/Time   CHOL 134 05/26/2020 0853   TRIG 102 05/26/2020 0853   HDL 63 05/26/2020 0853   CHOLHDL 3.8 02/27/2020 0000   LDLCALC 52 05/26/2020 0853   Physical Exam:    VS:  BP 100/62   Pulse 63   Ht 5\' 7"  (1.702 m)   Wt 208 lb (94.3 kg)   SpO2 97%   BMI 32.58 kg/m     Wt Readings from Last 3 Encounters:  02/07/22 208 lb (94.3 kg)  01/02/22 205 lb (93 kg)  12/28/21 205 lb (93 kg)     GEN:  Well nourished, well developed in no acute distress HEENT: Normal NECK: No JVD; No carotid bruits LYMPHATICS: No lymphadenopathy CARDIAC: RRR, no murmurs, rubs, gallops RESPIRATORY:  Clear to auscultation without rales, wheezing or rhonchi  ABDOMEN: Soft, non-tender, non-distended MUSCULOSKELETAL:  No edema; No deformity  SKIN: Warm and dry NEUROLOGIC:  Alert and oriented x 3 PSYCHIATRIC:  Normal affect   ASSESSMENT AND PLAN:    Generalized fatigue/weakness No clear cardiac etiology.  Stress test reassuring.  Monitor without significant finding.  Felt likely due to deconditioning and underlying Lyme disease and severe back issue.  Discussed gradual exercise build up.  2.  Palpitations/atrial tachycardia -Monitor result as above.  No much improvement on beta-blocker.  She will continue to take for few weeks to see  response.  If no improvement, she will  discontinue.  Medication Adjustments/Labs and Tests Ordered: Current medicines are reviewed at length with the patient today.  Concerns regarding medicines are outlined above.  No orders of the defined types were placed in this encounter.  No orders of the defined types were placed in this encounter.   Patient Instructions  Medication Instructions:  Your physician recommends that you continue on your current medications as directed. Please refer to the Current Medication list given to you today. *If you need a refill on your cardiac medications before your next appointment, please call your pharmacy*   Lab Work: None Ordered   Testing/Procedures: None Ordered   Follow-Up: At [facility], you and your health needs are our priority.  As part of our continuing mission to provide you with exceptional heart care, we have created designated Provider Care Teams.  These Care Teams include your primary Cardiologist (physician) and Advanced Practice Providers (APPs -  Physician Assistants and Nurse Practitioners) who all work together to provide you with the care you need, when you need it.  We recommend signing up for the patient portal called "MyChart".  Sign up information is provided on this After Visit Summary.  MyChart is used to connect with patients for Virtual Visits (Telemedicine).  Patients are able to view lab/test results, encounter notes, upcoming appointments, etc.  Non-urgent messages can be sent to your provider as well.   To learn more about what you can do with MyChart, go to [email or web address].    Your next appointment:   4 month(s)  The format for your next appointment:   In Person  Provider:   Charlton Haws, MD     Other Instructions   Important Information About Sugar         Lorelei Pont, [place]  02/07/2022 9:26 AM     [facility]

## 2022-02-07 ENCOUNTER — Ambulatory Visit: Payer: Medicare Other | Attending: Nurse Practitioner | Admitting: Physician Assistant

## 2022-02-07 ENCOUNTER — Encounter: Payer: Self-pay | Admitting: Physician Assistant

## 2022-02-07 VITALS — BP 100/62 | HR 63 | Ht 67.0 in | Wt 208.0 lb

## 2022-02-07 DIAGNOSIS — E782 Mixed hyperlipidemia: Secondary | ICD-10-CM | POA: Diagnosis not present

## 2022-02-07 DIAGNOSIS — I2510 Atherosclerotic heart disease of native coronary artery without angina pectoris: Secondary | ICD-10-CM | POA: Diagnosis not present

## 2022-02-07 DIAGNOSIS — R002 Palpitations: Secondary | ICD-10-CM | POA: Insufficient documentation

## 2022-02-07 NOTE — Patient Instructions (Signed)
Medication Instructions:  Your physician recommends that you continue on your current medications as directed. Please refer to the Current Medication list given to you today. *If you need a refill on your cardiac medications before your next appointment, please call your pharmacy*   Lab Work: None Ordered   Testing/Procedures: None Ordered   Follow-Up: At [facility], you and your health needs are our priority.  As part of our continuing mission to provide you with exceptional heart care, we have created designated Provider Care Teams.  These Care Teams include your primary Cardiologist (physician) and Advanced Practice Providers (APPs -  Physician Assistants and Nurse Practitioners) who all work together to provide you with the care you need, when you need it.  We recommend signing up for the patient portal called "MyChart".  Sign up information is provided on this After Visit Summary.  MyChart is used to connect with patients for Virtual Visits (Telemedicine).  Patients are able to view lab/test results, encounter notes, upcoming appointments, etc.  Non-urgent messages can be sent to your provider as well.   To learn more about what you can do with MyChart, go to [email or web address].    Your next appointment:   4 month(s)  The format for your next appointment:   In Person  Provider:   Peter Nishan, MD     Other Instructions   Important Information About Sugar       

## 2022-02-15 ENCOUNTER — Encounter (HOSPITAL_BASED_OUTPATIENT_CLINIC_OR_DEPARTMENT_OTHER): Payer: Self-pay | Admitting: Family Medicine

## 2022-02-15 ENCOUNTER — Ambulatory Visit (INDEPENDENT_AMBULATORY_CARE_PROVIDER_SITE_OTHER): Payer: Medicare Other

## 2022-02-15 ENCOUNTER — Ambulatory Visit (INDEPENDENT_AMBULATORY_CARE_PROVIDER_SITE_OTHER): Payer: Medicare Other | Admitting: Family Medicine

## 2022-02-15 VITALS — BP 103/74 | HR 60 | Ht 67.0 in | Wt 203.8 lb

## 2022-02-15 DIAGNOSIS — M79645 Pain in left finger(s): Secondary | ICD-10-CM

## 2022-02-15 DIAGNOSIS — A6920 Lyme disease, unspecified: Secondary | ICD-10-CM

## 2022-02-15 NOTE — Patient Instructions (Signed)
  Medication Instructions:  Your physician recommends that you continue on your current medications as directed. Please refer to the Current Medication list given to you today. --If you need a refill on any your medications before your next appointment, please call your pharmacy first. If no refills are authorized on file call the office.-- Lab Work: Your physician has recommended that you have lab work today: Yes If you have labs (blood work) drawn today and your tests are completely normal, you will receive your results via MyChart message OR a phone call from our staff.  Please ensure you check your voicemail in the event that you authorized detailed messages to be left on a delegated number. If you have any lab test that is abnormal or we need to change your treatment, we will call you to review the results.  Referrals/Procedures/Imaging: No  Follow-Up: Your next appointment:   Your physician recommends that you schedule a follow-up appointment in: 3-4 months with Dr. de [place].  You will receive a text message or e-mail with a link to a survey about your care and experience with us today! We would greatly appreciate your feedback!   Thanks for letting us be apart of your health journey!!  Primary Care and Sports Medicine   Dr. Raymond de [place]   We encourage you to activate your patient portal called "MyChart".  Sign up information is provided on this After Visit Summary.  MyChart is used to connect with patients for Virtual Visits (Telemedicine).  Patients are able to view lab/test results, encounter notes, upcoming appointments, etc.  Non-urgent messages can be sent to your provider as well. To learn more about what you can do with MyChart, please visit --  [email or web address].    

## 2022-02-15 NOTE — Assessment & Plan Note (Signed)
Patient reports that recently, she woke up and noticed that she had some pain and catching.  This primarily was near base of thumb.  She thinks current issue may have been brought on following shooting guns recently.  She will occasionally have some catching, no restriction in range of motion reported We will proceed with initial imaging for further evaluation, she will complete this today

## 2022-02-15 NOTE — Progress Notes (Signed)
    Procedures performed today:    None.  Independent interpretation of notes and tests performed by another provider:   None.  Brief History, Exam, Impression, and Recommendations:    BP 103/74   Pulse 60   Ht 5\' 7"  (1.702 m)   Wt 203 lb 12.8 oz (92.4 kg)   SpO2 100%   BMI 31.92 kg/m   Pain of left thumb Patient reports that recently, she woke up and noticed that she had some pain and catching.  This primarily was near base of thumb.  She thinks current issue may have been brought on following shooting guns recently.  She will occasionally have some catching, no restriction in range of motion reported We will proceed with initial imaging for further evaluation, she will complete this today  Lyme disease Patient with history of Lyme disease, she has received treatment in the past, both through antibiotics with doxycycline as well as with alternative therapies.  She has been having continued issues with fatigue and other nonspecific complaints and was wondering about further evaluation for chronic Lyme disease.  We have placed referral to infectious disease provider, however they declined the referral due to duration of time since last Lyme disease testing and treatment with antibiotics in the interim.  Due to this, they were advising at updated labs should be completed and evaluation can be arranged should these return positive We will complete labs today and follow-up on results.  If testing is positive, would proceed with evaluation with infectious disease provider  Return in about 4 months (around 06/17/2022).   ___________________________________________ Archer Moist de [place], MD, ABFM, CAQSM Primary [facility] and [employer]

## 2022-02-15 NOTE — Assessment & Plan Note (Signed)
Patient with history of Lyme disease, she has received treatment in the past, both through antibiotics with doxycycline as well as with alternative therapies.  She has been having continued issues with fatigue and other nonspecific complaints and was wondering about further evaluation for chronic Lyme disease.  We have placed referral to infectious disease provider, however they declined the referral due to duration of time since last Lyme disease testing and treatment with antibiotics in the interim.  Due to this, they were advising at updated labs should be completed and evaluation can be arranged should these return positive We will complete labs today and follow-up on results.  If testing is positive, would proceed with evaluation with infectious disease provider

## 2022-02-16 LAB — LYME DISEASE SEROLOGY W/REFLEX: Lyme Total Antibody EIA: NEGATIVE

## 2022-03-10 ENCOUNTER — Other Ambulatory Visit: Payer: Self-pay | Admitting: Neurology

## 2022-04-12 DIAGNOSIS — Z961 Presence of intraocular lens: Secondary | ICD-10-CM | POA: Diagnosis not present

## 2022-04-12 DIAGNOSIS — H0288A Meibomian gland dysfunction right eye, upper and lower eyelids: Secondary | ICD-10-CM | POA: Diagnosis not present

## 2022-04-12 DIAGNOSIS — H0288B Meibomian gland dysfunction left eye, upper and lower eyelids: Secondary | ICD-10-CM | POA: Diagnosis not present

## 2022-05-01 DIAGNOSIS — K5909 Other constipation: Secondary | ICD-10-CM | POA: Diagnosis not present

## 2022-05-01 DIAGNOSIS — E782 Mixed hyperlipidemia: Secondary | ICD-10-CM | POA: Diagnosis not present

## 2022-05-01 DIAGNOSIS — R5382 Chronic fatigue, unspecified: Secondary | ICD-10-CM | POA: Diagnosis not present

## 2022-05-01 DIAGNOSIS — Z1231 Encounter for screening mammogram for malignant neoplasm of breast: Secondary | ICD-10-CM | POA: Diagnosis not present

## 2022-05-01 DIAGNOSIS — M791 Myalgia, unspecified site: Secondary | ICD-10-CM | POA: Diagnosis not present

## 2022-05-01 DIAGNOSIS — E559 Vitamin D deficiency, unspecified: Secondary | ICD-10-CM | POA: Diagnosis not present

## 2022-05-01 DIAGNOSIS — E538 Deficiency of other specified B group vitamins: Secondary | ICD-10-CM | POA: Diagnosis not present

## 2022-05-01 DIAGNOSIS — R635 Abnormal weight gain: Secondary | ICD-10-CM | POA: Diagnosis not present

## 2022-05-10 ENCOUNTER — Other Ambulatory Visit (HOSPITAL_COMMUNITY): Payer: Self-pay | Admitting: Adult Health Nurse Practitioner

## 2022-05-10 ENCOUNTER — Ambulatory Visit (HOSPITAL_COMMUNITY)
Admission: RE | Admit: 2022-05-10 | Discharge: 2022-05-10 | Disposition: A | Discharge status: Home / Self Care | Payer: Medicare Other | Source: Ambulatory Visit | Attending: Adult Health Nurse Practitioner | Admitting: Adult Health Nurse Practitioner

## 2022-05-10 DIAGNOSIS — M79671 Pain in right foot: Secondary | ICD-10-CM | POA: Insufficient documentation

## 2022-05-16 ENCOUNTER — Other Ambulatory Visit (HOSPITAL_COMMUNITY): Payer: Self-pay | Admitting: Adult Health Nurse Practitioner

## 2022-05-16 DIAGNOSIS — M8970 Major osseous defect, unspecified site: Secondary | ICD-10-CM

## 2022-05-16 DIAGNOSIS — M810 Age-related osteoporosis without current pathological fracture: Secondary | ICD-10-CM

## 2022-05-17 ENCOUNTER — Ambulatory Visit (HOSPITAL_BASED_OUTPATIENT_CLINIC_OR_DEPARTMENT_OTHER): Payer: Medicare Other | Admitting: Family Medicine

## 2022-05-25 DIAGNOSIS — Z1231 Encounter for screening mammogram for malignant neoplasm of breast: Secondary | ICD-10-CM | POA: Diagnosis not present

## 2022-05-25 DIAGNOSIS — R92323 Mammographic fibroglandular density, bilateral breasts: Secondary | ICD-10-CM | POA: Diagnosis not present

## 2022-05-30 ENCOUNTER — Other Ambulatory Visit (HOSPITAL_COMMUNITY): Payer: Self-pay | Admitting: Adult Health Nurse Practitioner

## 2022-05-30 DIAGNOSIS — R928 Other abnormal and inconclusive findings on diagnostic imaging of breast: Secondary | ICD-10-CM

## 2022-05-31 ENCOUNTER — Inpatient Hospital Stay
Admission: RE | Admit: 2022-05-31 | Discharge: 2022-05-31 | Disposition: A | Discharge status: Home / Self Care | Payer: Self-pay | Source: Ambulatory Visit | Attending: Adult Health Nurse Practitioner | Admitting: Adult Health Nurse Practitioner

## 2022-05-31 ENCOUNTER — Other Ambulatory Visit (HOSPITAL_COMMUNITY): Payer: PRIVATE HEALTH INSURANCE

## 2022-05-31 ENCOUNTER — Other Ambulatory Visit (HOSPITAL_COMMUNITY): Payer: Self-pay | Admitting: Adult Health Nurse Practitioner

## 2022-05-31 DIAGNOSIS — R928 Other abnormal and inconclusive findings on diagnostic imaging of breast: Secondary | ICD-10-CM

## 2022-06-05 NOTE — Progress Notes (Signed)
CARDIOLOGY CONSULT NOTE       Patient ID: MANHATTAN MCCUEN MRN: [id number] DOB/AGE: 1950/07/18 72 y.o.  Admit date: (Not on file) Referring Physician: [name] Primary Physician: Pablo Lawrence, NP Primary Cardiologist: Johnsie Cancel  Reason for Consultation: Family history of CAD    HPI:  72 y.o. referred by Dr [name] for family history of CAD First seen by me 02/24/20 . She established care with him on 01/11/20. She has distant history of tick bite Rx doxycycline lyme testing equivocal. 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 Monitor 2019 no significant arrhythmiias. Rx with PRN atenolol Seemed to be stress induced She has GERD post gastric bypass. Also distant history of PE in 2016    Former smoker quit in 1999  TTE 03/01/20 normal EF 60-65% no significant valve disease   Cardiac CTA 10/15/17 with calcium score 62 which was 74 th percentile no obstructive CAD isolated to LAD left dominant system  Some cervical /lumbar spine issues previous L4S1 fusion Last injection cervical spine done 12/23/19   Started on lipitor 20 mg 03/05/20 for LDL 111 with LDL now at goal   She and her husband use to ride motorcycles   Seen by PA August 2023 with palpitations and atypical chest pain  Myovue 01/02/22 normal no ischemia EF 73%  Monitor NSR no significant arrhythmias short run atrial tachycardia 8 beats or less started on lopressor 12.5 mg bid  Stays busy with some [employer] in town. Has 3 boys in [place]. One who is a stage hand Has had issues with depression/suicide ideology and is living with the youngest boy now  She has had some skin lesions on scalp Fatigue ? Lupus and previous Lyme dx but all blood  Tests negative   ROS All other systems reviewed and negative except as noted above  Past Medical History:  Diagnosis Date   Allergy to alpha-gal     Angio-edema    Anxiety    Asthma    childhood asthma   Colonic mass    In [place], found inconclusive   Eczema    Environmental allergies    trees, grass, oak trees   GERD (gastroesophageal reflux disease)    Leg cramps 12/15/2019   Lumbar spinal stenosis 12/15/2019   L2-3 level   Lyme disease    Neuropathy    Numbness    Pulmonary embolism (HCC)    Seasonal allergies     Family History  Problem Relation Age of Onset   Colon cancer Maternal Aunt    Crohn's disease Grandchild    Crohn's disease Son    Cancer Mother        kidney or liver   Heart failure Father    Heart attack Father    Pancreatic cancer Sister    Stroke Maternal Grandmother    Allergic rhinitis Neg Hx    Asthma Neg Hx    Eczema Neg Hx    Urticaria Neg Hx    Esophageal cancer Neg Hx    Stomach cancer Neg Hx     Social History   Socioeconomic History   Marital status: Married    Spouse name: Not on file   Number of children: 3   Years of education: 12   Highest education level: High school graduate  Occupational History   Occupation: Retired from trucking  Tobacco Use   Smoking  status: Former    Types: Cigarettes    Quit date: 05/29/1997    Years since quitting: 25.0   Smokeless tobacco: Never  Vaping Use   Vaping Use: Never used  Substance and Sexual Activity   Alcohol use: Yes    Comment: occasional   Drug use: Never   Sexual activity: Not on file  Other Topics Concern   Not on file  Social History Narrative   Lives at home with husband.  Married multiple times.  3 grown sons also grandchildren and step grandchildren.   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    Social Determinants of Health   Financial Resource Strain: Low Risk  (10/07/2021)   Overall Financial Resource Strain (CARDIA)    Difficulty of Paying Living Expenses: Not very hard   Food Insecurity: No Food Insecurity (10/07/2021)   Hunger Vital Sign    Worried About Running Out of Food in the Last Year: Never true    Ran Out of Food in the Last Year: Never true  Transportation Needs: No Transportation Needs (10/07/2021)   PRAPARE - Administrator, Civil Service (Medical): No    Lack of Transportation (Non-Medical): No  Physical Activity: Inactive (10/07/2021)   Exercise Vital Sign    Days of Exercise per Week: 0 days    Minutes of Exercise per Session: 0 min  Stress: No Stress Concern Present (10/07/2021)   [employer] of Occupational Health - Occupational Stress Questionnaire    Feeling of Stress : Not at all  Social Connections: Socially Integrated (10/07/2021)   Social Connection and Isolation Panel [NHANES]    Frequency of Communication with Friends and Family: More than three times a week    Frequency of Social Gatherings with Friends and Family: Twice a week    Attends Religious Services: 1 to 4 times per year    Active Member of [employer] or Organizations: Yes    Attends Banker Meetings: 1 to 4 times per year    Marital Status: Married  Catering manager Violence: Not At Risk (10/07/2021)   Humiliation, Afraid, Rape, and Kick questionnaire    Fear of Current or Ex-Partner: No    Emotionally Abused: No    Physically Abused: No    Sexually Abused: No    Past Surgical History:  Procedure Laterality Date   APPENDECTOMY     COLONOSCOPY  2018   [place]: diverticulosis   ESOPHAGOGASTRODUODENOSCOPY N/A 01/09/2018   normal esophagus, normal residual stomach status post gastric obesity procedure.   GASTRIC BYPASS  2003   LUMBAR FUSION  2016   PARTIAL HYSTERECTOMY  1980   TONSILLECTOMY     WRIST FRACTURE SURGERY  2013      Current Outpatient Medications:    Ascorbic Acid (VITAMIN C WITH ROSE HIPS) 1000 MG tablet, Take 1,000 mg by mouth daily., Disp: , Rfl:    aspirin EC 81 MG tablet, Take 81 mg by mouth daily., Disp: , Rfl:     atorvastatin (LIPITOR) 10 MG tablet, TAKE ONE TABLET BY MOUTH EVERY DAY, Disp: 90 tablet, Rfl: 3   CALCIUM PO, Take 500 mg by mouth daily. , Disp: , Rfl:    cephALEXin (KEFLEX) 250 MG capsule, Take 250 mg by mouth daily., Disp: , Rfl:    CRANBERRY PO, Take 1  tablet by mouth daily., Disp: , Rfl:    dicyclomine (BENTYL) 10 MG capsule, Take 10 mg by mouth every 6 (six) hours as needed., Disp: , Rfl:    EPINEPHrine 0.3 mg/0.3 mL IJ SOAJ injection, See admin instructions., Disp: , Rfl:    ergocalciferol (VITAMIN D2) 1.25 MG (50000 UT) capsule, Take 1 capsule (50,000 Units total) by mouth once a week., Disp: 12 capsule, Rfl: 3   ferrous sulfate 325 (65 FE) MG tablet, Take 325 mg by mouth daily with breakfast., Disp: , Rfl:    hydrochlorothiazide (MICROZIDE) 12.5 MG capsule, Take 1 capsule (12.5 mg total) by mouth daily as needed (for edema)., Disp: 90 capsule, Rfl: 3   ketoconazole (NIZORAL) 2 % shampoo, Apply to scalp twice a week for 8 weeks and then weekly thereafter, Disp: 120 mL, Rfl: 1   MAGNESIUM PO, Take 1 tablet by mouth daily., Disp: , Rfl:    melatonin 3 MG TABS tablet, Take 6 mg by mouth at bedtime., Disp: , Rfl:    metoprolol tartrate (LOPRESSOR) 25 MG tablet, Take 0.5 tablets (12.5 mg total) by mouth 2 (two) times daily., Disp: 30 tablet, Rfl: 3   Misc Natural Products (NEURIVA) CAPS, See admin instructions., Disp: , Rfl:    ondansetron (ZOFRAN) 4 MG tablet, Take 1 tablet (4 mg total) by mouth every 6 (six) hours as needed for nausea or vomiting., Disp: 30 tablet, Rfl: 1   pantoprazole (PROTONIX) 40 MG tablet, Take 1 tablet by mouth daily., Disp: , Rfl:    polyethylene glycol (MIRALAX / GLYCOLAX) 17 g packet, Take 17 g by mouth as needed for mild constipation or moderate constipation., Disp: , Rfl:    pregabalin (LYRICA) 100 MG capsule, TAKE 1 CAPSULE BY MOUTH IN THE MORNING AND TAKE 2 CAPSULES BY MOUTH AT NIGHT, Disp: 270 capsule, Rfl: 1   PRESCRIPTION MEDICATION, Allergy shot three times  weekly, Disp: , Rfl:    tiZANidine (ZANAFLEX) 4 MG tablet, Take 1 tablet (4 mg total) by mouth daily as needed., Disp: 30 tablet, Rfl: 1    Physical Exam: There were no vitals taken for this visit.   Affect appropriate Healthy:  appears stated age HEENT: normal Neck supple with no adenopathy JVP normal no bruits no thyromegaly Lungs clear with no wheezing and good diaphragmatic motion Heart:  S1/S2 no murmur, no rub, gallop or click PMI normal Abdomen: benighn, post bariatric surgery  Distal pulses intact with no bruits No edema Neuro non-focal Skin warm and dry No muscular weakness   Labs:   Lab Results  Component Value Date   WBC 5.6 07/05/2021   HGB 12.9 07/05/2021   HCT 38.9 07/05/2021   MCV 89 07/05/2021   PLT 231 07/05/2021   No results for input(s): "NA", "K", "CL", "CO2", "BUN", "CREATININE", "CALCIUM", "PROT", "BILITOT", "ALKPHOS", "ALT", "AST", "GLUCOSE" in the last 168 hours.  Invalid input(s): "LABALBU" No results found for: "CKTOTAL", "CKMB", "CKMBINDEX", "TROPONINI"  Lab Results  Component Value Date   CHOL 134 05/26/2020   CHOL 192 02/27/2020   Lab Results  Component Value Date   HDL 63 05/26/2020   HDL 51 02/27/2020   Lab Results  Component Value Date   LDLCALC 52 05/26/2020   LDLCALC 111 (H) 02/27/2020   Lab Results  Component Value Date   TRIG 102 05/26/2020   TRIG 173 (H) 02/27/2020   Lab Results  Component Value Date   CHOLHDL 3.8 02/27/2020   No results found for: "LDLDIRECT"  Radiology: MM Outside Films Mammo  Result Date: 05/31/2022 This examination belongs to an outside facility and is stored here for comparison purposes only.  Contact the originating outside institution for any associated report or interpretation.  DG Foot Complete Right  Result Date: 05/11/2022 CLINICAL DATA:  RIGHT foot pain EXAM: RIGHT FOOT COMPLETE - 3+ VIEW COMPARISON:  None Available. FINDINGS: Osseous mineralization low normal. Joint spaces  preserved. No fracture, dislocation, or bone destruction. IMPRESSION: No osseous abnormalities. Electronically Signed   By: Ulyses Southward M.D.   On: 05/11/2022 16:58    EKG: 06/05/2022 SR rate 80 normal    ASSESSMENT AND PLAN:   1. CAD: non obstructive left dominant system with calcium score 62 isolated to LAD on CT 10/15/17  On ASA 81 mg Started on lipitor and LDL at target now Myovue 12/2021 non ischemic continue risk factor modification   2. GERD: post gastric bypass on Prilosec   3. Ortho:  Lumbar and cervical spine issues post fusion and injection Rx f/u IR radiology and neuro continue Lyrica  4. Fatigue:  Functional rhythm and vitals fine exam fine EF 60-65% by TTE 03/01/20 no significant valve dx and normal EF on myovue 01/02/22   5. Palpitations: benign monitor 01/31/22 continue low dose beta blocker   6. Lyme Dx: ? Distant repeat testing 02/15/22 negative    F/U in a year   Signed: Charlton Haws 06/05/2022, 1:36 PM

## 2022-06-06 ENCOUNTER — Ambulatory Visit (HOSPITAL_COMMUNITY)
Admission: RE | Admit: 2022-06-06 | Discharge: 2022-06-06 | Disposition: A | Discharge status: Home / Self Care | Payer: Medicare Other | Source: Ambulatory Visit | Attending: Adult Health Nurse Practitioner | Admitting: Adult Health Nurse Practitioner

## 2022-06-06 ENCOUNTER — Other Ambulatory Visit (HOSPITAL_COMMUNITY): Payer: PRIVATE HEALTH INSURANCE

## 2022-06-06 DIAGNOSIS — M8970 Major osseous defect, unspecified site: Secondary | ICD-10-CM | POA: Diagnosis not present

## 2022-06-06 DIAGNOSIS — R928 Other abnormal and inconclusive findings on diagnostic imaging of breast: Secondary | ICD-10-CM

## 2022-06-06 DIAGNOSIS — M810 Age-related osteoporosis without current pathological fracture: Secondary | ICD-10-CM | POA: Insufficient documentation

## 2022-06-06 DIAGNOSIS — N6489 Other specified disorders of breast: Secondary | ICD-10-CM | POA: Diagnosis not present

## 2022-06-09 DIAGNOSIS — N3020 Other chronic cystitis without hematuria: Secondary | ICD-10-CM | POA: Diagnosis not present

## 2022-06-09 DIAGNOSIS — R350 Frequency of micturition: Secondary | ICD-10-CM | POA: Diagnosis not present

## 2022-06-12 ENCOUNTER — Ambulatory Visit: Payer: Medicare Other | Attending: Cardiovascular Disease | Admitting: Cardiovascular Disease

## 2022-06-12 ENCOUNTER — Other Ambulatory Visit: Payer: Self-pay

## 2022-06-12 ENCOUNTER — Encounter: Payer: Self-pay | Admitting: Cardiovascular Disease

## 2022-06-12 VITALS — BP 124/62 | HR 71 | Ht 67.0 in | Wt 208.0 lb

## 2022-06-12 DIAGNOSIS — R072 Precordial pain: Secondary | ICD-10-CM

## 2022-06-12 DIAGNOSIS — R002 Palpitations: Secondary | ICD-10-CM

## 2022-06-12 DIAGNOSIS — E782 Mixed hyperlipidemia: Secondary | ICD-10-CM

## 2022-06-12 MED ORDER — ROSUVASTATIN CALCIUM 5 MG PO TABS: 5.0000 mg | ORAL_TABLET | Freq: Every day | ORAL | 3 refills | Status: DC

## 2022-06-12 NOTE — Patient Instructions (Addendum)
Medication Instructions:  Your physician has recommended you make the following change in your medication:  1-STOP Lipitor 2-START Crestor 5 mg by mouth daily.    *If you need a refill on your cardiac medications before your next appointment, please call your pharmacy*  Lab Work: Your physician recommends that you return for lab work in: 3 month for lipid and liver panel.   If you have labs (blood work) drawn today and your tests are completely normal, you will receive your results only by: [facility] (if you have MyChart) OR A paper copy in the mail If you have any lab test that is abnormal or we need to change your treatment, we will call you to review the results.  Testing/Procedures: None ordered today.  Follow-Up: At [facility], you and your health needs are our priority.  As part of our continuing mission to provide you with exceptional heart care, we have created designated Provider Care Teams.  These Care Teams include your primary Cardiologist (physician) and Advanced Practice Providers (APPs -  Physician Assistants and Nurse Practitioners) who all work together to provide you with the care you need, when you need it.  We recommend signing up for the patient portal called "MyChart".  Sign up information is provided on this After Visit Summary.  MyChart is used to connect with patients for Virtual Visits (Telemedicine).  Patients are able to view lab/test results, encounter notes, upcoming appointments, etc.  Non-urgent messages can be sent to your provider as well.   To learn more about what you can do with MyChart, go to [email or web address].    Your next appointment:   1 year(s)  Provider:   Jenkins Rouge, MD

## 2022-06-12 NOTE — Addendum Note (Signed)
Addended by: Aris Georgia, Aleksandar Duve L on: 06/12/2022 08:57 AM   Modules accepted: Orders

## 2022-06-15 ENCOUNTER — Other Ambulatory Visit: Payer: Self-pay

## 2022-06-15 DIAGNOSIS — M810 Age-related osteoporosis without current pathological fracture: Secondary | ICD-10-CM | POA: Insufficient documentation

## 2022-06-21 ENCOUNTER — Other Ambulatory Visit: Payer: Self-pay

## 2022-06-22 ENCOUNTER — Other Ambulatory Visit: Payer: Self-pay

## 2022-06-27 ENCOUNTER — Telehealth: Payer: Self-pay | Admitting: Pharmacy Technician

## 2022-06-27 NOTE — Telephone Encounter (Addendum)
Auth Submission: NO AUTH NEEDED Payer: [employer] Medication & CPT/J Code(s) submitted: Prolia (Denosumab) [id number] Route of submission (phone, fax, portal):  Phone # [phone] Fax # Auth type: Buy/Bill Units/visits requested: 60mg  q62mo Reference number: [id number] Approval from: 06/27/22 to 05/29/23   Once $300 deductible is met prolia will be covered at 100%. Patient has met $0 of $300 deductible.  2nd verify: @AP  REF: [id number]

## 2022-06-29 ENCOUNTER — Encounter (HOSPITAL_COMMUNITY): Payer: Self-pay | Admitting: Adult Health Nurse Practitioner

## 2022-06-29 ENCOUNTER — Encounter (HOSPITAL_COMMUNITY)
Admission: RE | Admit: 2022-06-29 | Discharge: 2022-06-29 | Disposition: A | Discharge status: Home / Self Care | Payer: Medicare Other | Source: Ambulatory Visit | Attending: Adult Health Nurse Practitioner | Admitting: Adult Health Nurse Practitioner

## 2022-06-29 VITALS — BP 124/86 | HR 65 | Temp 98.1°F | Resp 16

## 2022-06-29 DIAGNOSIS — M810 Age-related osteoporosis without current pathological fracture: Secondary | ICD-10-CM | POA: Diagnosis not present

## 2022-06-29 MED ORDER — DENOSUMAB 60 MG/ML ~~LOC~~ SOSY
60.0000 mg | PREFILLED_SYRINGE | Freq: Once | SUBCUTANEOUS | Status: AC
  Administered 2022-06-29: 60 mg via SUBCUTANEOUS

## 2022-06-29 NOTE — Progress Notes (Signed)
Diagnosis: Osteoporosis  Provider:  Courtney Keatts NP  Procedure: Injection  Prolia (Denosumab), Dose: 60 mg, Site: subcutaneous, Number of injections: 1  Post Care: Observation period completed  Discharge: Condition: Good, Destination: Home . AVS provided to patient.   Performed by:  Kingsley Farace E, RN        

## 2022-07-04 DIAGNOSIS — E782 Mixed hyperlipidemia: Secondary | ICD-10-CM | POA: Diagnosis not present

## 2022-07-04 DIAGNOSIS — E6609 Other obesity due to excess calories: Secondary | ICD-10-CM | POA: Diagnosis not present

## 2022-07-04 DIAGNOSIS — L219 Seborrheic dermatitis, unspecified: Secondary | ICD-10-CM | POA: Diagnosis not present

## 2022-07-04 DIAGNOSIS — Z1330 Encounter for screening examination for mental health and behavioral disorders, unspecified: Secondary | ICD-10-CM | POA: Diagnosis not present

## 2022-07-04 DIAGNOSIS — Z6833 Body mass index (BMI) 33.0-33.9, adult: Secondary | ICD-10-CM | POA: Diagnosis not present

## 2022-08-14 IMAGING — CT CT SHOULDER*L* W/CM
1 series · 12 of 14 positions shown, 15 images · non-contrast
Comparison: Plain films left shoulder 08/13/2020. Image from
contrast injection reviewed.

CLINICAL DATA: Posterior left shoulder pain for 6 years, worse over
the past 2 months. No known injury.

EXAM:
CT ARTHROGRAPHY OF THE LEFT SHOULDER
TECHNIQUE: Multidetector CT imaging was performed following the standard
protocol after injection of dilute contrast into the joint.

[Series 3: soft tissue · axial · 0.50mm/px · z∈[-223,-57]mm · 12 of 99 slices shown, 15 images]
[im 8/99  soft-tissue]
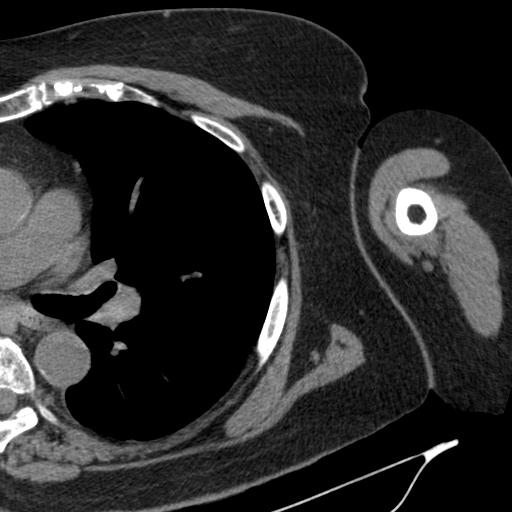
[im 8/99  bone]
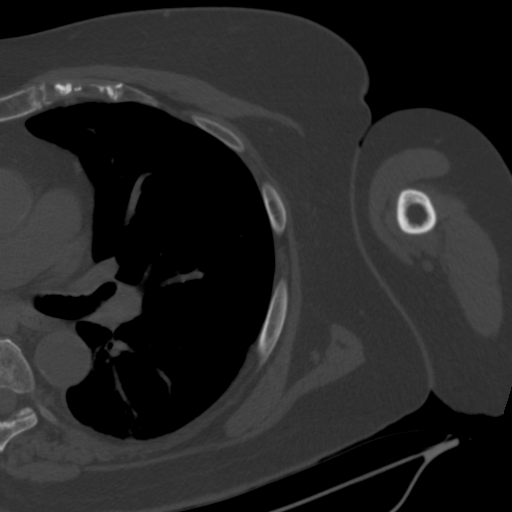
[im 16/99  bone]
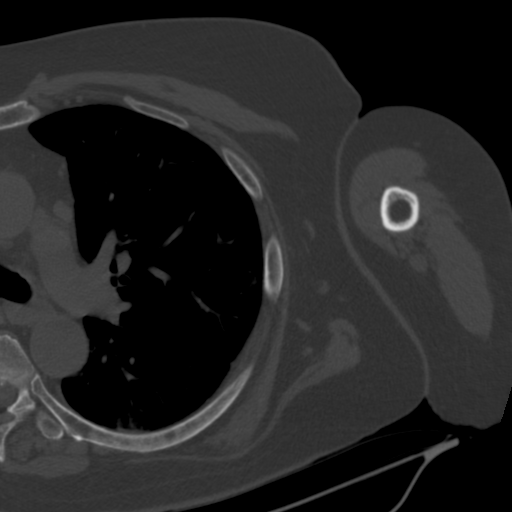
[im 23/99  bone]
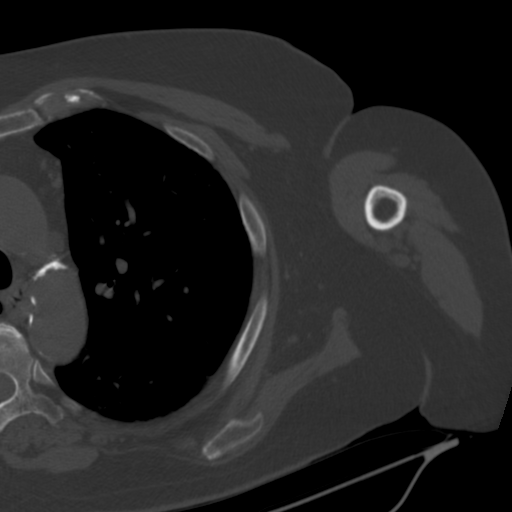
[im 31/99  bone]
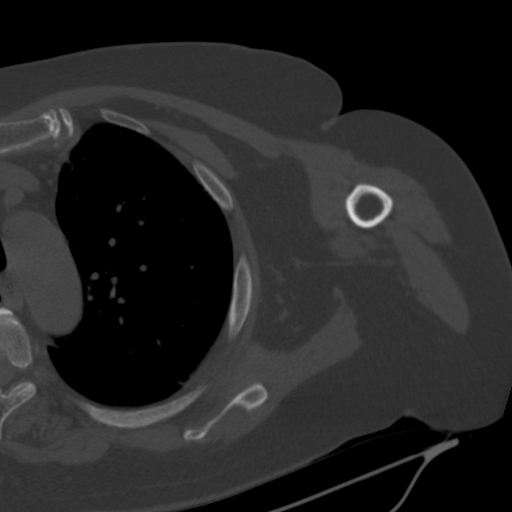
[im 38/99  soft-tissue]
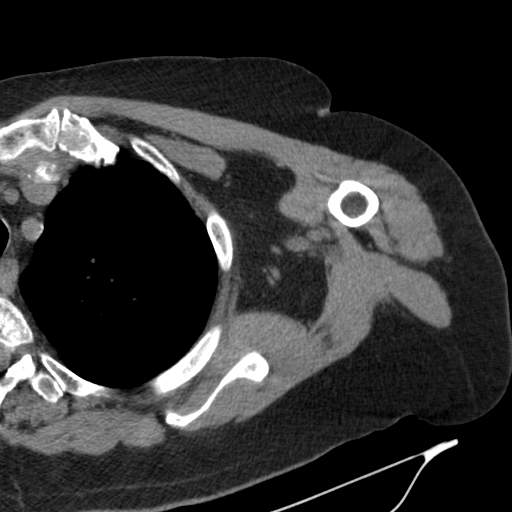
[im 38/99  bone]
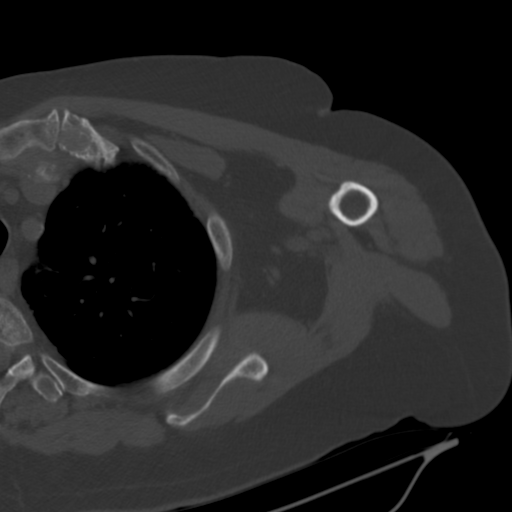
[im 46/99  bone]
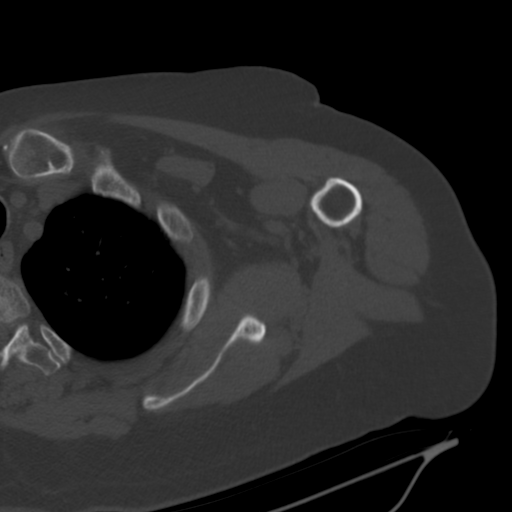
[im 53/99  bone]
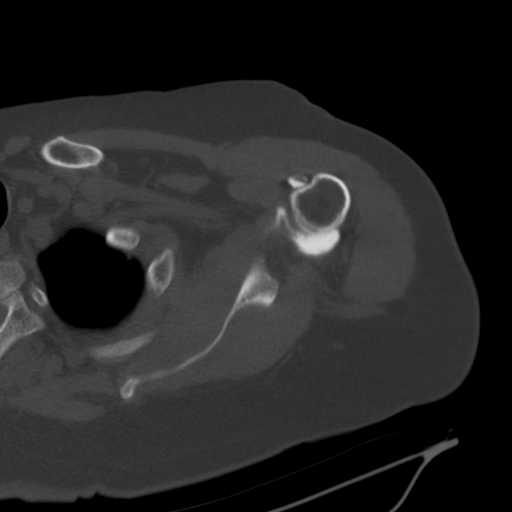
[im 61/99  bone]
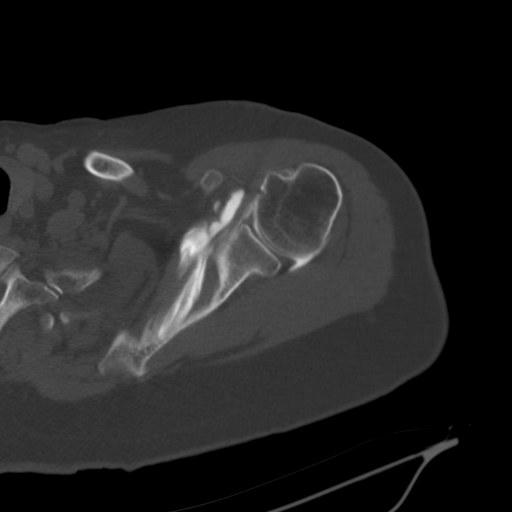
[im 68/99  soft-tissue]
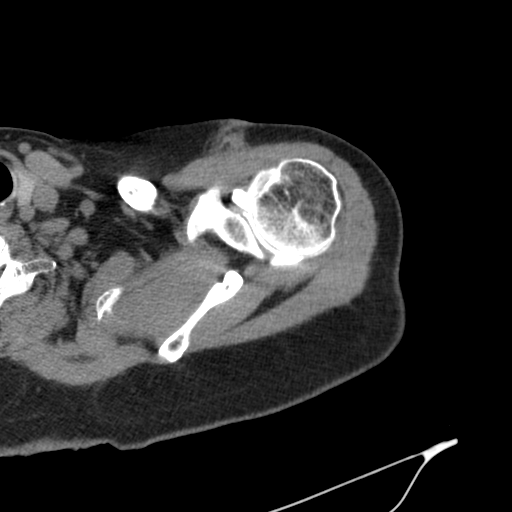
[im 68/99  bone]
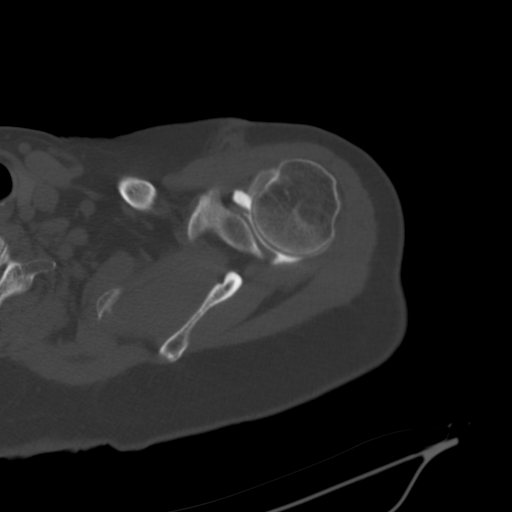
[im 76/99  bone]
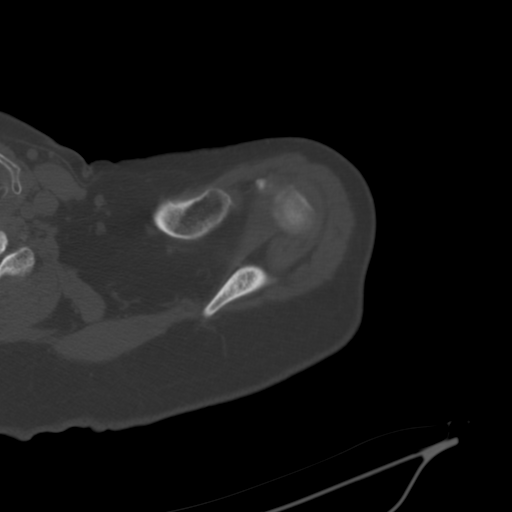
[im 83/99  bone]
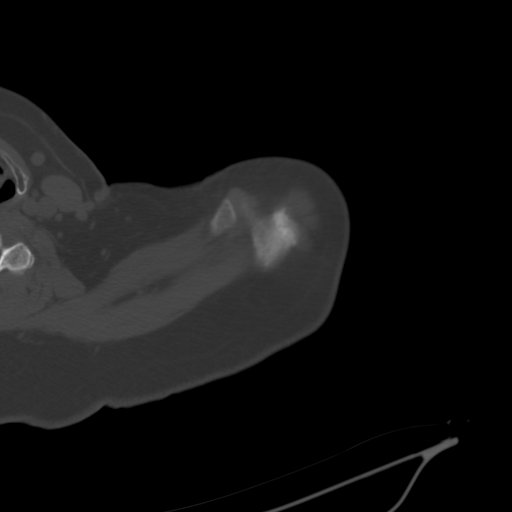
[im 91/99  bone]
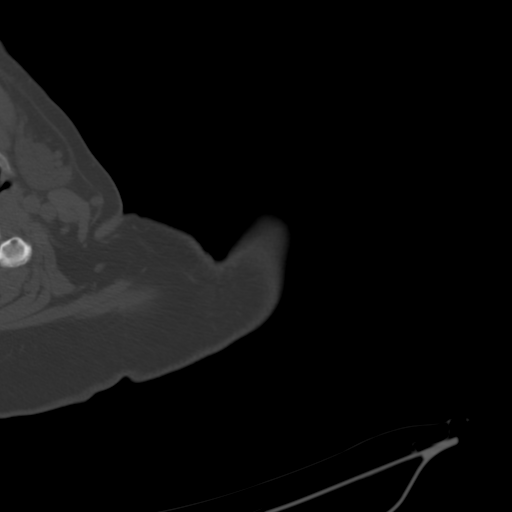

[12 of 14 positions shown; findings below may reference images not displayed]

FINDINGS: Rotator cuff: Intact. Mild appearing supraspinatus tendinopathy
noted.

Muscles:  Normal without atrophy or focal lesion.

Biceps long head:  Intact.

Acromioclavicular Joint: Very mild appearing osteoarthritis. Type 2
acromion. No contrast or fluid is seen in the subacromial/subdeltoid
bursa.

Glenohumeral Joint: Appears normal.

Labrum:  Intact.

Bones:  No fracture or focal lesion.

Other: Imaged lung parenchyma demonstrates small, patchy areas of
ground-glass attenuation. Aortic atherosclerosis is noted.
IMPRESSION: Mild appearing supraspinatus tendinopathy without tear.

Very mild appearing acromioclavicular osteoarthritis.

Small, patchy areas of ground-glass attenuation in the imaged right
lung are nonspecific and may be due to some prior infectious or
inflammatory process. A acute bronchopneumonia could create a
similar appearance.

## 2022-08-14 IMAGING — XA DG FLUORO GUIDE NDL PLC/BX
2 series · 2 of 2 positions shown · IV contrast (omnipaque)
Comparison: none

CLINICAL DATA: 70-year-old female with chronic left shoulder pain.

EXAM:
LEFT SHOULDER INJECTION UNDER FLUOROSCOPY
TECHNIQUE: An appropriate skin entrance site was determined. The site was
marked, prepped with Betadine, draped in the usual sterile fashion,
and infiltrated locally with buffered Lidocaine. 22 gauge spinal
needle was advanced to the superomedial margin of the humeral head
under intermittent fluoroscopy. 1 ml of Lidocaine injected easily. A
total of 10 ml of Omnipaque 300 was then used to opacify the left
shoulder capsule. No immediate complication.
FLUOROSCOPY TIME:  Fluoroscopy Time:  38 seconds
Radiation Exposure Index (if provided by the fluoroscopic device):
1.4 mGy
Number of Acquired Spot Images: 1

[Series 1: ortho adipose · 1 of 1 slices shown (1 of 2)]
[im 1/1]
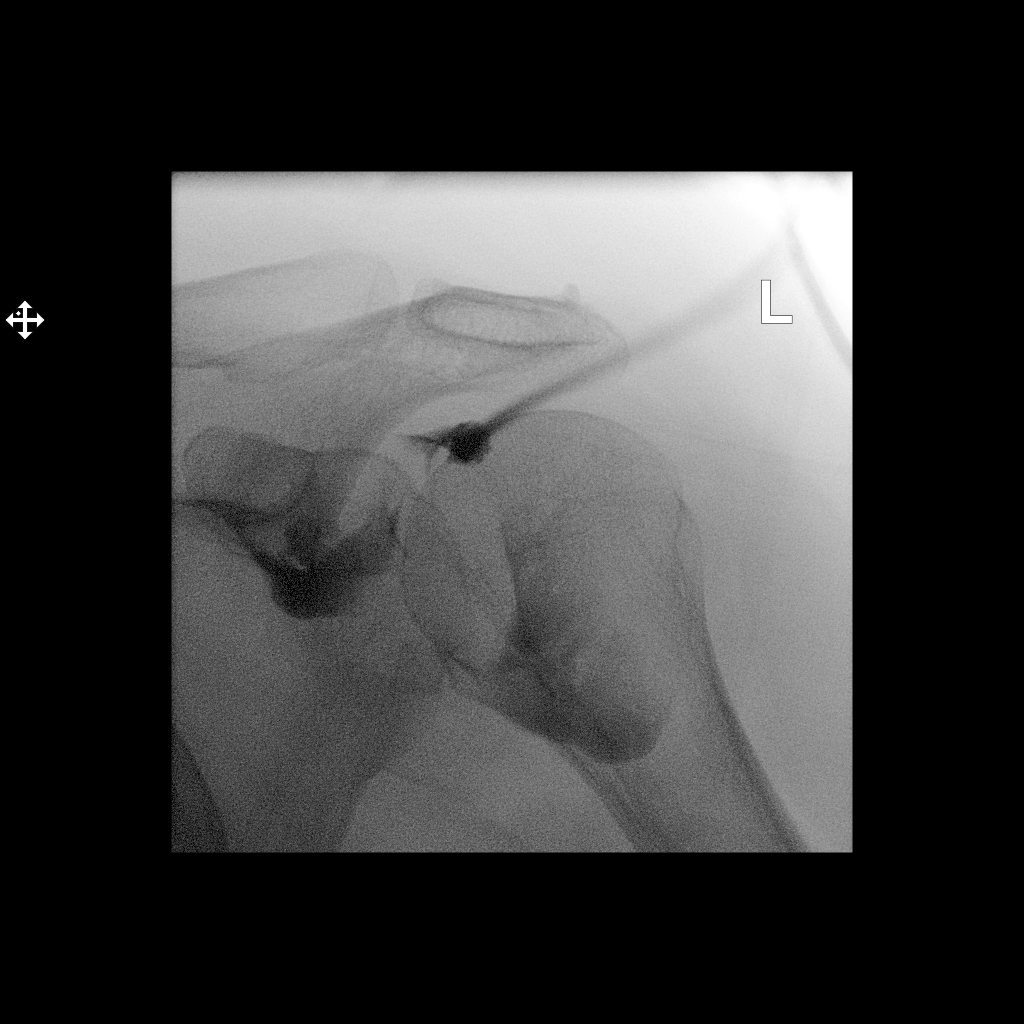

[Series 2: ortho adipose · 1 of 1 slices shown (2 of 2)]
[im 1/1]
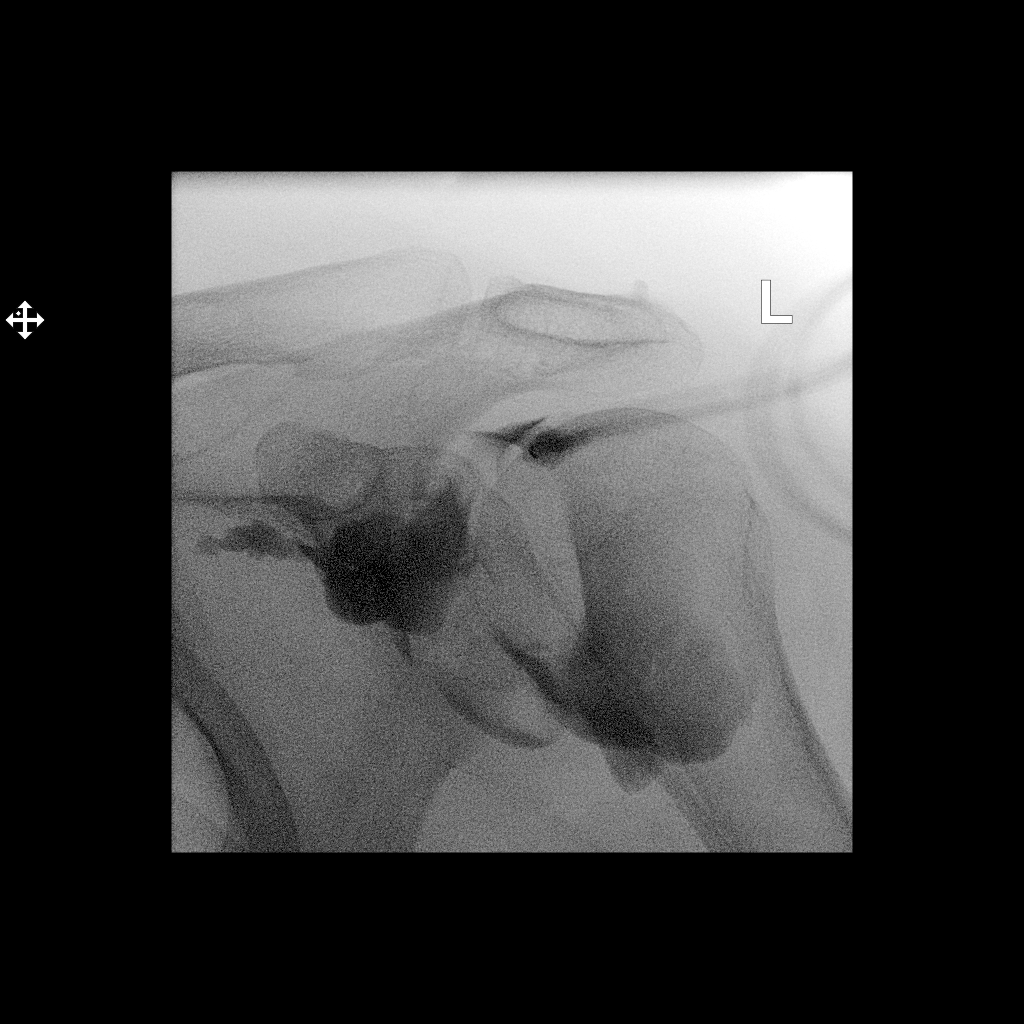

[2 of 2 positions shown; findings below may reference images not displayed]

FINDINGS: Technically successful left shoulder joint injection.
IMPRESSION: Technically successful left shoulder injection for CT arthrogram.

## 2022-09-11 ENCOUNTER — Other Ambulatory Visit: Payer: PRIVATE HEALTH INSURANCE

## 2022-09-11 DIAGNOSIS — E782 Mixed hyperlipidemia: Secondary | ICD-10-CM | POA: Diagnosis not present

## 2022-09-12 LAB — HEPATIC FUNCTION PANEL
ALT: 19 IU/L (ref 0–32)
AST: 14 IU/L (ref 0–40)
Albumin: 4.2 g/dL (ref 3.8–4.8)
Alkaline Phosphatase: 98 IU/L (ref 44–121)
Bilirubin Total: 0.3 mg/dL (ref 0.0–1.2)
Bilirubin, Direct: 0.1 mg/dL (ref 0.00–0.40)
Total Protein: 6.5 g/dL (ref 6.0–8.5)

## 2022-09-12 LAB — LIPID PANEL
Chol/HDL Ratio: 2.6 ratio (ref 0.0–4.4)
Cholesterol, Total: 121 mg/dL (ref 100–199)
HDL: 47 mg/dL (ref >39)
LDL Chol Calc (NIH): 54 mg/dL (ref 0–99)
Triglycerides: 107 mg/dL (ref 0–149)
VLDL Cholesterol Cal: 20 mg/dL (ref 5–40)

## 2022-09-23 DIAGNOSIS — Z6830 Body mass index (BMI) 30.0-30.9, adult: Secondary | ICD-10-CM | POA: Diagnosis not present

## 2022-09-23 DIAGNOSIS — J209 Acute bronchitis, unspecified: Secondary | ICD-10-CM | POA: Diagnosis not present

## 2022-09-23 DIAGNOSIS — E669 Obesity, unspecified: Secondary | ICD-10-CM | POA: Diagnosis not present

## 2022-09-23 DIAGNOSIS — R030 Elevated blood-pressure reading, without diagnosis of hypertension: Secondary | ICD-10-CM | POA: Diagnosis not present

## 2022-09-25 DIAGNOSIS — J069 Acute upper respiratory infection, unspecified: Secondary | ICD-10-CM | POA: Diagnosis not present

## 2022-09-25 DIAGNOSIS — J0100 Acute maxillary sinusitis, unspecified: Secondary | ICD-10-CM | POA: Diagnosis not present

## 2022-09-25 DIAGNOSIS — J029 Acute pharyngitis, unspecified: Secondary | ICD-10-CM | POA: Diagnosis not present

## 2022-11-28 DIAGNOSIS — R350 Frequency of micturition: Secondary | ICD-10-CM | POA: Diagnosis not present

## 2022-11-28 DIAGNOSIS — B961 Klebsiella pneumoniae [K. pneumoniae] as the cause of diseases classified elsewhere: Secondary | ICD-10-CM | POA: Diagnosis not present

## 2022-11-28 DIAGNOSIS — N390 Urinary tract infection, site not specified: Secondary | ICD-10-CM | POA: Diagnosis not present

## 2022-12-28 ENCOUNTER — Encounter (HOSPITAL_COMMUNITY): Admission: RE | Admit: 2022-12-28 | Payer: Medicare Other | Source: Ambulatory Visit

## 2022-12-28 ENCOUNTER — Encounter: Payer: Medicare Other | Attending: Adult Health Nurse Practitioner

## 2022-12-28 VITALS — BP 136/84 | HR 88 | Temp 98.5°F | Resp 18

## 2022-12-28 DIAGNOSIS — M810 Age-related osteoporosis without current pathological fracture: Secondary | ICD-10-CM | POA: Diagnosis not present

## 2022-12-28 MED ORDER — DENOSUMAB 60 MG/ML ~~LOC~~ SOSY
60.0000 mg | PREFILLED_SYRINGE | Freq: Once | SUBCUTANEOUS | Status: AC
  Administered 2022-12-28: 60 mg via SUBCUTANEOUS

## 2022-12-28 NOTE — Progress Notes (Signed)
Diagnosis: Osteoporosis  Provider:  Roe Rutherford NP  Procedure: Injection  Prolia (Denosumab), Dose: 60 mg, Site: subcutaneous, Number of injections: 1 Calcium 9.4 on 05/01/22  Post Care: Patient declined observation  Discharge: Condition: Good, Destination: Home . AVS Provided  Performed by:  Arrie Senate, RN

## 2022-12-29 ENCOUNTER — Encounter: Payer: Self-pay | Admitting: Adult Health Nurse Practitioner

## 2023-01-01 NOTE — Progress Notes (Signed)
Diagnosis: Osteoporosis  Provider:  Roe Rutherford NP  Procedure: Injection  Prolia (Denosumab), Dose: 60 mg, Site: subcutaneous, Number of injections: 1 Calcium 9.4 on 05/01/22  Post Care: Patient declined observation  Discharge: Condition: Good, Destination: Home . AVS Provided  Performed by:  Marin Shutter, RN

## 2023-03-23 ENCOUNTER — Emergency Department (HOSPITAL_COMMUNITY): Payer: Medicare Other

## 2023-03-23 ENCOUNTER — Telehealth: Payer: Self-pay | Admitting: Cardiovascular Disease

## 2023-03-23 ENCOUNTER — Other Ambulatory Visit: Payer: Self-pay

## 2023-03-23 ENCOUNTER — Emergency Department (HOSPITAL_COMMUNITY)
Admission: EM | Admit: 2023-03-23 | Discharge: 2023-03-23 | Disposition: A | Discharge status: Home / Self Care | Payer: Medicare Other | Attending: Emergency Medicine | Admitting: Emergency Medicine

## 2023-03-23 ENCOUNTER — Encounter (HOSPITAL_COMMUNITY): Payer: Self-pay

## 2023-03-23 DIAGNOSIS — R079 Chest pain, unspecified: Secondary | ICD-10-CM

## 2023-03-23 DIAGNOSIS — R531 Weakness: Secondary | ICD-10-CM | POA: Diagnosis not present

## 2023-03-23 DIAGNOSIS — Z7982 Long term (current) use of aspirin: Secondary | ICD-10-CM | POA: Insufficient documentation

## 2023-03-23 DIAGNOSIS — R110 Nausea: Secondary | ICD-10-CM | POA: Diagnosis not present

## 2023-03-23 DIAGNOSIS — R5383 Other fatigue: Secondary | ICD-10-CM | POA: Insufficient documentation

## 2023-03-23 DIAGNOSIS — Q676 Pectus excavatum: Secondary | ICD-10-CM | POA: Diagnosis not present

## 2023-03-23 DIAGNOSIS — J029 Acute pharyngitis, unspecified: Secondary | ICD-10-CM | POA: Insufficient documentation

## 2023-03-23 DIAGNOSIS — R0789 Other chest pain: Secondary | ICD-10-CM | POA: Diagnosis not present

## 2023-03-23 LAB — CBC
HCT: 38.6 % (ref 36.0–46.0)
Hemoglobin: 12.1 g/dL (ref 12.0–15.0)
MCH: 28.4 pg (ref 26.0–34.0)
MCHC: 31.3 g/dL (ref 30.0–36.0)
MCV: 90.6 fL (ref 80.0–100.0)
Platelets: 199 10*3/uL (ref 150–400)
RBC: 4.26 MIL/uL (ref 3.87–5.11)
RDW: 13.5 % (ref 11.5–15.5)
WBC: 6.4 10*3/uL (ref 4.0–10.5)
nRBC: 0 % (ref 0.0–0.2)

## 2023-03-23 LAB — BASIC METABOLIC PANEL
Anion gap: 11 (ref 5–15)
BUN: 19 mg/dL (ref 8–23)
CO2: 27 mmol/L (ref 22–32)
Calcium: 9.3 mg/dL (ref 8.9–10.3)
Chloride: 105 mmol/L (ref 98–111)
Creatinine, Ser: 0.71 mg/dL (ref 0.44–1.00)
GFR, Estimated: >60 mL/min (ref >60)
Glucose, Bld: 95 mg/dL (ref 70–99)
Potassium: 4.4 mmol/L (ref 3.5–5.1)
Sodium: 143 mmol/L (ref 135–145)

## 2023-03-23 LAB — LIPASE, BLOOD: Lipase: 37 U/L (ref 11–51)

## 2023-03-23 LAB — TROPONIN I (HIGH SENSITIVITY)
Troponin I (High Sensitivity): <2 ng/L (ref <18)
Troponin I (High Sensitivity): <2 ng/L (ref <18)

## 2023-03-23 NOTE — Telephone Encounter (Signed)
Spoke with pt who complains of active chest pain.  Pt states she has been having intermittent chest pain x 1 week.  It has been accompanied at times by nausea and some stomach discomfort.  It is not associated with eating.  She also complains of severe fatigue.  She states she has to stop and rest if she is out shopping which is new for her.  Pt advised due to active symptoms pt should seek treatment in the ED now.  She should call EMS or have someone drive her to the nearest ED.  Pt verbalizes understanding and agrees with current plan.

## 2023-03-23 NOTE — ED Provider Notes (Signed)
[place] EMERGENCY DEPARTMENT AT [facility] Provider Note   CSN: [id number] Arrival date & time: 03/23/23  1242     History {Add pertinent medical, surgical, social history, OB history to HPI:1} Chief Complaint  Patient presents with   Chest Pain    Patricia Eaton is a 72 y.o. female.   Chest Pain      Home Medications Prior to Admission medications   Medication Sig Start Date End Date Taking? Authorizing Provider  Ascorbic Acid (VITAMIN C WITH ROSE HIPS) 1000 MG tablet Take 1,000 mg by mouth daily. Patient not taking: Reported on 06/12/2022    [provider]  aspirin EC 81 MG tablet Take 81 mg by mouth daily.    [provider]  CALCIUM PO Take 500 mg by mouth daily.     [provider]  cephALEXin (KEFLEX) 250 MG capsule Take 250 mg by mouth daily. 09/21/21   [provider]  CRANBERRY PO Take 1 tablet by mouth daily.    [provider]  diclofenac (VOLTAREN) 75 MG EC tablet Take 75 mg by mouth 2 (two) times daily.    [provider]  dicyclomine (BENTYL) 10 MG capsule Take 10 mg by mouth every 6 (six) hours as needed. Patient not taking: Reported on 06/12/2022 10/25/21   [provider]  doxycycline (MONODOX) 100 MG capsule Take 100 mg by mouth daily.    [provider]  EPINEPHrine 0.3 mg/0.3 mL IJ SOAJ injection See admin instructions. Patient not taking: Reported on 06/12/2022    [provider]  ergocalciferol (VITAMIN D2) 1.25 MG (50000 UT) capsule Take 1 capsule (50,000 Units total) by mouth once a week. 12/06/21   [place], Buren Kos, MD  ferrous sulfate 325 (65 FE) MG tablet Take 325 mg by mouth daily with breakfast.    [provider]  hydrochlorothiazide (MICROZIDE) 12.5 MG capsule Take 1 capsule (12.5 mg total) by mouth daily as needed (for edema). 02/24/20   Wendall Stade, MD  ketoconazole (NIZORAL) 2 % shampoo Apply to scalp twice a week for 8 weeks  and then weekly thereafter Patient not taking: Reported on 06/12/2022 11/02/21   [place], Buren Kos, MD  MAGNESIUM PO Take 1 tablet by mouth daily.    [provider]  melatonin 3 MG TABS tablet Take 6 mg by mouth at bedtime.    [provider]  metoprolol tartrate (LOPRESSOR) 25 MG tablet Take 0.5 tablets (12.5 mg total) by mouth 2 (two) times daily. Patient not taking: Reported on 06/12/2022 02/02/22   Manson Passey, PA  Misc Natural Products (NEURIVA) CAPS See admin instructions.    [provider]  ondansetron (ZOFRAN) 4 MG tablet Take 1 tablet (4 mg total) by mouth every 6 (six) hours as needed for nausea or vomiting. Patient not taking: Reported on 06/12/2022 06/03/21   Iva Boop, MD  pantoprazole (PROTONIX) 40 MG tablet Take 1 tablet by mouth daily. 10/25/21   [provider]  polyethylene glycol (MIRALAX / GLYCOLAX) 17 g packet Take 17 g by mouth as needed for mild constipation or moderate constipation.    [provider]  pregabalin (LYRICA) 100 MG capsule TAKE 1 CAPSULE BY MOUTH IN THE MORNING AND TAKE 2 CAPSULES BY MOUTH AT NIGHT 08/18/21   Glean Salvo, NP  PRESCRIPTION MEDICATION Allergy shot three times weekly    [provider]  rosuvastatin (CRESTOR) 5 MG tablet Take 1 tablet (5 mg total) by  mouth daily. 06/12/22   Wendall Stade, MD  tiZANidine (ZANAFLEX) 4 MG tablet Take 1 tablet (4 mg total) by mouth daily as needed. 07/05/21   [place], Buren Kos, MD      Allergies    Molds & smuts and Other    Review of Systems   Review of Systems  Cardiovascular:  Positive for chest pain.    Physical Exam Updated Vital Signs BP (!) 164/80 (BP Location: Left Arm)   Pulse 81   Temp 98.8 F (37.1 C) (Oral)   Resp 14   Ht 5\' 7"  (1.702 m)   Wt 86.6 kg   SpO2 100%   BMI 29.91 kg/m  Physical Exam  ED Results / Procedures / Treatments   Labs (all labs ordered are listed, but only abnormal results are displayed) Labs Reviewed   BASIC METABOLIC PANEL  CBC  LIPASE, BLOOD  TROPONIN I (HIGH SENSITIVITY)  TROPONIN I (HIGH SENSITIVITY)    EKG EKG Interpretation Date/Time:  Friday March 23 2023 12:33:59 EDT Ventricular Rate:  69 PR Interval:  158 QRS Duration:  78 QT Interval:  408 QTC Calculation: 437 R Axis:   75  Text Interpretation: Normal sinus rhythm Normal ECG No previous ECGs available Confirmed by Alona Bene [phone]) on 03/23/2023 8:50:10 PM  Radiology DG Chest 2 View  Result Date: 03/23/2023 CLINICAL DATA:  Chest pain.  Weakness EXAM: CHEST - 2 VIEW COMPARISON:  None Available. FINDINGS: No consolidation, pneumothorax or effusion. No edema. Normal cardiopericardial silhouette. Degenerative changes of the spine. Slight pectus excavatum. IMPRESSION: No acute cardiopulmonary disease. Electronically Signed   By: Karen Kays M.D.   On: 03/23/2023 14:21    Procedures Procedures  {Document cardiac monitor, telemetry assessment procedure when appropriate:1}  Medications Ordered in ED Medications - No data to display  ED Course/ Medical Decision Making/ A&P   {   Click here for ABCD2, HEART and other calculatorsREFRESH Note before signing :1}                              Medical Decision Making Amount and/or Complexity of Data Reviewed Labs: ordered. Radiology: ordered.   ***  {Document critical care time when appropriate:1} {Document review of labs and clinical decision tools ie heart score, Chads2Vasc2 etc:1}  {Document your independent review of radiology images, and any outside records:1} {Document your discussion with family members, caretakers, and with consultants:1} {Document social determinants of health affecting pt's care:1} {Document your decision making why or why not admission, treatments were needed:1} Final Clinical Impression(s) / ED Diagnoses Final diagnoses:  Nonspecific chest pain    Rx / DC Orders ED Discharge Orders          Ordered    Ambulatory referral to  Cardiology       Comments: If you have not heard from the Cardiology office within the next 72 hours please call [phone].   03/23/23 2107

## 2023-03-23 NOTE — Telephone Encounter (Signed)
   Pt c/o of Chest Pain: STAT if active CP, including tightness, pressure, jaw pain, radiating pain to shoulder/upper arm/back, CP unrelieved by Nitro. Symptoms reported of SOB, nausea, vomiting, sweating.  1. Are you having CP right now? yes    2. Are you experiencing any other symptoms (ex. SOB, nausea, vomiting, sweating)? Nausea sometimes, stomach pain   3. Is your CP continuous or coming and going? Coming and Going    4. Have you taken Nitroglycerin? No    5. How long have you been experiencing CP? 1 Week    6. If NO CP at time of call then end call with telling Pt to call back or call 911 if Chest pain returns prior to return call from triage team.

## 2023-03-23 NOTE — Discharge Instructions (Addendum)
You are seen in the emergency department today with concerns of chest pain.  Your troponin levels, EKG, chest x-ray are all unremarkable.  There does not appear to be a cardiac specific cause to your pain however I do think you would benefit from cardiology follow-up.  Please reach out to your cardiology office for repeat assessment.  If symptoms are worsening, return to the emergency department.

## 2023-03-23 NOTE — ED Triage Notes (Addendum)
Pt c/o chest and abdominal pain x 1 week. Pt states she feels more fatigue than earlier. Pt has had nausea and sore throat. Pt denies shortness of breath.

## 2023-04-09 ENCOUNTER — Telehealth: Payer: Self-pay | Admitting: Cardiovascular Disease

## 2023-04-09 NOTE — Telephone Encounter (Signed)
Call to patient to discuss her concerns. Patient states she went to ED on 03/23/23 for chest pain, she states her EKG and troponins were negative. She does not have chest pain today but does not feel she received appropriate work up in the ED and is upset that "something could happen to me if I don't get some answers". She states she has her annual visit with Dr. Eden Emms in January but wants to come in sooner. She does not track her BP/HR at home. I reviewed ED instructions with her and asked if she wanted to go to PCP first to have work up for GERD as suggested by ED, patient says no. Patient also refuses appt with APP now for hospital follow up and then to keep January appt with Dr. Eden Emms. Patient states "I want to see only Dr. Eden Emms". Changed January appt to DOD appt w/ Dr. Eden Emms on 04/17/23.

## 2023-04-09 NOTE — Telephone Encounter (Signed)
Patient is calling because she was in the hospital and was told that they could not find anything. Patient is scheduled in January but would like to know what Dr. Eden Emms can do to help her. Please advise.

## 2023-04-10 NOTE — Progress Notes (Signed)
CARDIOLOGY CONSULT NOTE       Patient ID: Patricia Eaton MRN: [id number] DOB/AGE: [birth year] 72 y.o.  Admit date: (Not on file) Referring Physician: [name] Primary Physician: Roe Rutherford, NP Primary Cardiologist: Eden Emms  Reason for Consultation: Family history of CAD    HPI:  72 y.o. referred by Dr Prince Rome for family history of CAD First seen by me 02/24/20 . She established care with him on 01/11/20. She has distant history of tick bite Rx doxycycline lyme testing equivocal. 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 Monitor 2019 no significant arrhythmiias. Rx with PRN atenolol Seemed to be stress induced She has GERD post gastric bypass. Also distant history of PE in 2016    Former smoker quit in 1999  TTE 03/01/20 normal EF 60-65% no significant valve disease   Cardiac CTA 10/15/17 with calcium score 62 which was 74 th percentile no obstructive CAD isolated to LAD left dominant system  Some cervical /lumbar spine issues previous L4S1 fusion Last injection cervical spine done 12/23/19   Started on lipitor 20 mg 03/05/20 for LDL 111 with LDL now at goal   She and her husband use to ride motorcycles   Seen by PA August 2023 with palpitations and atypical chest pain  Myovue 01/02/22 normal no ischemia EF 73%  Monitor NSR no significant arrhythmias short run atrial tachycardia 8 beats or less started on lopressor 12.5 mg bid  Stays busy with some [employer] in town. Has 3 boys in [place]. One who is a stage hand Has had issues with depression/suicide ideology and is living with the youngest boy now  Seen in ED 03/27/23.  Chest pain R/O negative troponin x 2 Endorses epigastric tenderness as well as lower chest pain off and on for the last week or so. Also endorsing some increasing fatigue in the last month. Has had some associated nausea but denies  any vomiting or diaphoresis. Sore throat but denies any fevers, cough, headache, runny nose  ECG normal CXR NAD ? GERD Trial of PPI suggested   She is very skeptical about our ability to detect life threatening CAD. Her dad died of "massive" MI after he was told his heart was good a month earlier She has a nephew who drives a truck and had an MI on the road  Discussed further testing with cardiac CTA  ROS All other systems reviewed and negative except as noted above  Past Medical History:  Diagnosis Date   Allergy to alpha-gal    Angio-edema    Anxiety    Asthma    childhood asthma   Colonic mass    In [place], found inconclusive   Eczema    Environmental allergies    trees, grass, oak trees   GERD (gastroesophageal reflux disease)    Leg cramps 12/15/2019   Lumbar spinal stenosis 12/15/2019   L2-3 level   Lyme disease    Neuropathy    Numbness    Pulmonary embolism (HCC)    Seasonal allergies     Family History  Problem Relation Age of Onset   Colon cancer Maternal Aunt    Crohn's disease Grandchild    Crohn's disease Son    Cancer Mother        kidney or liver   Heart failure Father    Heart attack Father    Pancreatic cancer Sister  Stroke Maternal Grandmother    Allergic rhinitis Neg Hx    Asthma Neg Hx    Eczema Neg Hx    Urticaria Neg Hx    Esophageal cancer Neg Hx    Stomach cancer Neg Hx     Social History   Socioeconomic History   Marital status: Married    Spouse name: Not on file   Number of children: 3   Years of education: 12   Highest education level: High school graduate  Occupational History   Occupation: Retired from trucking  Tobacco Use   Smoking status: Former    Current packs/day: 0.00    Types: Cigarettes    Quit date: 05/29/1997    Years since quitting: 25.9   Smokeless tobacco: Never  Vaping Use   Vaping status: Never Used  Substance and Sexual Activity   Alcohol use: Yes    Comment: occasional   Drug use: Never   Sexual  activity: Not on file  Other Topics Concern   Not on file  Social History Narrative   Lives at home with husband.  Married multiple times.  3 grown sons also grandchildren and step grandchildren.   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    Social Determinants of Health   Financial Resource Strain: Medium Risk (07/04/2022)   Received from [facility], [facility]   Overall Financial Resource Strain (CARDIA)    Difficulty of Paying Living Expenses: Somewhat hard  Food Insecurity: No Food Insecurity (07/04/2022)   Received from [facility], [facility]   Hunger Vital Sign    Worried About Running Out of Food in the Last Year: Never true    Ran Out of Food in the Last Year: Never true  Transportation Needs: No Transportation Needs (07/04/2022)   [facility], [facility]   PRAPARE - Transportation    Lack of Transportation (Medical): No    Lack of Transportation (Non-Medical): No  Physical Activity: Unknown (04/30/2022)   Received from [facility], [facility]   Exercise Vital Sign    Days of Exercise per Week: 0 days    Minutes of Exercise per Session: Not on file  Stress: No Stress Concern Present (04/30/2022)   Received from [facility], [facility] - Occupational Stress Questionnaire    Feeling of Stress : Not at all  Social Connections: Moderately Integrated (04/30/2022)   Received from [facility], [facility]   Social Network    How would you rate your social network (family, work, friends)?: Adequate participation with social networks  Intimate Partner Violence: Not At Risk (04/30/2022)   Received from [facility], [facility]   HITS    Over the last 12 months how often did your partner physically hurt you?: Never    Over the last 12 months how often did  your partner insult you or talk down to you?: Never    Over the last 12 months how often did your partner threaten you with physical harm?: Never    Over the last 12 months how often did your partner scream or curse at you?: Never    Past Surgical History:  Procedure Laterality Date   APPENDECTOMY     COLONOSCOPY  2018   [place]: diverticulosis   ESOPHAGOGASTRODUODENOSCOPY N/A 01/09/2018  normal esophagus, normal residual stomach status post gastric obesity procedure.   GASTRIC BYPASS  2003   LUMBAR FUSION  2016   PARTIAL HYSTERECTOMY  1980   TONSILLECTOMY     WRIST FRACTURE SURGERY  2013      Current Outpatient Medications:    albuterol (VENTOLIN HFA) 108 (90 Base) MCG/ACT inhaler, Inhale into the lungs., Disp: , Rfl:    Ascorbic Acid (VITAMIN C WITH ROSE HIPS) 1000 MG tablet, Take 1,000 mg by mouth daily., Disp: , Rfl:    aspirin EC 81 MG tablet, Take 81 mg by mouth daily., Disp: , Rfl:    CALCIUM PO, Take 500 mg by mouth daily. , Disp: , Rfl:    cephALEXin (KEFLEX) 250 MG capsule, Take 250 mg by mouth daily., Disp: , Rfl:    CRANBERRY PO, Take 1 tablet by mouth daily., Disp: , Rfl:    ergocalciferol (VITAMIN D2) 1.25 MG (50000 UT) capsule, Take 1 capsule (50,000 Units total) by mouth once a week., Disp: 12 capsule, Rfl: 3   ferrous sulfate 325 (65 FE) MG tablet, Take 325 mg by mouth daily with breakfast., Disp: , Rfl:    furosemide (LASIX) 40 MG tablet, Take by mouth., Disp: , Rfl:    ketoconazole (NIZORAL) 2 % shampoo, Apply to scalp twice a week for 8 weeks and then weekly thereafter, Disp: 120 mL, Rfl: 1   MAGNESIUM PO, Take 1 tablet by mouth daily., Disp: , Rfl:    melatonin 3 MG TABS tablet, Take 6 mg by mouth at bedtime., Disp: , Rfl:    Misc Natural Products (NEURIVA) CAPS, See admin instructions., Disp: , Rfl:    pantoprazole (PROTONIX) 40 MG tablet, Take 1 tablet by mouth daily., Disp: , Rfl:    polyethylene glycol (MIRALAX / GLYCOLAX) 17 g packet, Take 17 g by mouth  as needed for mild constipation or moderate constipation., Disp: , Rfl:    pregabalin (LYRICA) 100 MG capsule, TAKE 1 CAPSULE BY MOUTH IN THE MORNING AND TAKE 2 CAPSULES BY MOUTH AT NIGHT, Disp: 270 capsule, Rfl: 1   PRESCRIPTION MEDICATION, Allergy shot three times weekly, Disp: , Rfl:    rosuvastatin (CRESTOR) 5 MG tablet, Take 1 tablet (5 mg total) by mouth daily., Disp: 90 tablet, Rfl: 3   tiZANidine (ZANAFLEX) 4 MG tablet, Take 1 tablet (4 mg total) by mouth daily as needed., Disp: 30 tablet, Rfl: 1   ZEPBOUND 5 MG/0.5ML Pen, Inject 5 mg into the skin once a week., Disp: , Rfl:     Physical Exam: Blood pressure 126/84, pulse 80, height 5\' 7"  (1.702 m), weight 191 lb 9.6 oz (86.9 kg), SpO2 93%.   Affect appropriate Healthy:  appears stated age HEENT: normal Neck supple with no adenopathy JVP normal no bruits no thyromegaly Lungs clear with no wheezing and good diaphragmatic motion Heart:  S1/S2 no murmur, no rub, gallop or click PMI normal Abdomen: benighn, post bariatric surgery  Distal pulses intact with no bruits No edema Neuro non-focal Skin warm and dry No muscular weakness   Labs:   Lab Results  Component Value Date   WBC 6.4 03/23/2023   HGB 12.1 03/23/2023   HCT 38.6 03/23/2023   MCV 90.6 03/23/2023   PLT 199 03/23/2023   No results for input(s): "NA", "K", "CL", "CO2", "BUN", "CREATININE", "CALCIUM", "PROT", "BILITOT", "ALKPHOS", "ALT", "AST", "GLUCOSE" in the last 168 hours.  Invalid input(s): "LABALBU" No results found for: "CKTOTAL", "CKMB", "CKMBINDEX", "TROPONINI"  Lab Results  Component  Value Date   CHOL 121 09/11/2022   CHOL 134 05/26/2020   CHOL 192 02/27/2020   Lab Results  Component Value Date   HDL 47 09/11/2022   HDL 63 05/26/2020   HDL 51 02/27/2020   Lab Results  Component Value Date   LDLCALC 54 09/11/2022   LDLCALC 52 05/26/2020   LDLCALC 111 (H) 02/27/2020   Lab Results  Component Value Date   TRIG 107 09/11/2022   TRIG 102  05/26/2020   TRIG 173 (H) 02/27/2020   Lab Results  Component Value Date   CHOLHDL 2.6 09/11/2022   CHOLHDL 3.8 02/27/2020   No results found for: "LDLDIRECT"    Radiology: DG Chest 2 View  Result Date: 03/23/2023 CLINICAL DATA:  Chest pain.  Weakness EXAM: CHEST - 2 VIEW COMPARISON:  None Available. FINDINGS: No consolidation, pneumothorax or effusion. No edema. Normal cardiopericardial silhouette. Degenerative changes of the spine. Slight pectus excavatum. IMPRESSION: No acute cardiopulmonary disease. Electronically Signed   By: Karen Kays M.D.   On: 03/23/2023 14:21    EKG: 04/17/2023 SR rate 80 normal    ASSESSMENT AND PLAN:   1. CAD: non obstructive left dominant system with calcium score 62 isolated to LAD on CT 10/15/17  On ASA 81 mg Started on lipitor and LDL at target now Myovue 12/2021 non ischemic Recent ER visit 03/27/23 ? GERD with R/O normal ECG, negative troponin x2 and CXR NAD. Shared decision making favor repeat gated cardiac CTA to further risk stratify  2. GERD: post gastric bypass on Prilosec   3. Ortho:  Lumbar and cervical spine issues post fusion and injection Rx f/u IR radiology and neuro continue Lyrica  4. Fatigue:  Functional rhythm and vitals fine exam fine EF 60-65% by TTE 03/01/20 no significant valve dx and normal EF on myovue 01/02/22   5. Palpitations: benign monitor 01/31/22 continue low dose beta blocker   6. Lyme Dx: ? Distant repeat testing 02/15/22 negative   BMET normal 03/23/23 Lopressor 100 mg 2 hours prior to study Cardiac CTA   F/U in a year  pending test results   Signed: Charlton Haws 04/17/2023, 3:24 PM

## 2023-04-17 ENCOUNTER — Ambulatory Visit: Payer: Medicare Other | Attending: Cardiovascular Disease | Admitting: Cardiovascular Disease

## 2023-04-17 ENCOUNTER — Encounter: Payer: Self-pay | Admitting: Cardiovascular Disease

## 2023-04-17 VITALS — BP 126/84 | HR 80 | Ht 67.0 in | Wt 191.6 lb

## 2023-04-17 DIAGNOSIS — I2510 Atherosclerotic heart disease of native coronary artery without angina pectoris: Secondary | ICD-10-CM | POA: Insufficient documentation

## 2023-04-17 DIAGNOSIS — R072 Precordial pain: Secondary | ICD-10-CM | POA: Diagnosis not present

## 2023-04-17 DIAGNOSIS — R002 Palpitations: Secondary | ICD-10-CM | POA: Insufficient documentation

## 2023-04-17 DIAGNOSIS — E782 Mixed hyperlipidemia: Secondary | ICD-10-CM | POA: Insufficient documentation

## 2023-04-17 MED ORDER — METOPROLOL TARTRATE 100 MG PO TABS: 100.0000 mg | ORAL_TABLET | Freq: Once | ORAL | 0 refills | Status: AC

## 2023-04-17 NOTE — Patient Instructions (Addendum)
Medication Instructions:  Your physician recommends that you continue on your current medications as directed. Please refer to the Current Medication list given to you today.  *If you need a refill on your cardiac medications before your next appointment, please call your pharmacy*   Lab Work: Your physician recommends that you have lab work today- BMET Your physician recommends that you return for lab work in: another day for fasting lipid and liver panel  If you have labs (blood work) drawn today and your tests are completely normal, you will receive your results only by: [employer] (if you have MyChart) OR A paper copy in the mail If you have any lab test that is abnormal or we need to change your treatment, we will call you to review the results.  Testing/Procedures: Your physician has requested that you have cardiac CT. Cardiac computed tomography (CT) is a painless test that uses an x-ray machine to take clear, detailed pictures of your heart. For further information please visit [email or web address] Please follow instruction sheet as given.   Follow-Up: At [facility], you and your health needs are our priority.  As part of our continuing mission to provide you with exceptional heart care, we have created designated Provider Care Teams.  These Care Teams include your primary Cardiologist (physician) and Advanced Practice Providers (APPs -  Physician Assistants and Nurse Practitioners) who all work together to provide you with the care you need, when you need it.  We recommend signing up for the patient portal called "MyChart".  Sign up information is provided on this After Visit Summary.  MyChart is used to connect with patients for Virtual Visits (Telemedicine).  Patients are able to view lab/test results, encounter notes, upcoming appointments, etc.  Non-urgent messages can be sent to your provider as well.   To learn more about what you can do with MyChart, go to  [email or web address].    Your next appointment:   1 year(s)  Provider:   Charlton Haws, MD     Other Instructions   Your cardiac CT will be scheduled at one of the below locations:   [facility] [address] [place], [place] [address] [phone]   If scheduled at [facility], please arrive at the [facility] and Children's Entrance (Entrance C2) of [facility] 30 minutes prior to test start time. You can use the FREE valet parking offered at entrance C (encouraged to control the heart rate for the test)  Proceed to the [facility] Radiology Department (first floor) to check-in and test prep.  All radiology patients and guests should use entrance C2 at [facility], accessed from [facility], even though the hospital's physical address listed is [address]     Please follow these instructions carefully (unless otherwise directed):   On the Night Before the Test: Be sure to Drink plenty of water. Do not consume any caffeinated/decaffeinated beverages or chocolate 12 hours prior to your test. Do not take any antihistamines 12 hours prior to your test.  On the Day of the Test: Drink plenty of water until 1 hour prior to the test. Do not eat any food 1 hour prior to test. You may take your regular medications prior to the test.  Take metoprolol (Lopressor) 100 mg two hours prior to test. FEMALES- please wear underwire-free bra if available, avoid dresses & tight clothing      After the Test: Drink plenty of water. After receiving IV  contrast, you may experience a mild flushed feeling. This is normal. On occasion, you may experience a mild rash up to 24 hours after the test. This is not dangerous. If this occurs, you can take Benadryl 25 mg and increase your fluid intake. If you experience trouble breathing, this can be serious. If it is severe call 911 IMMEDIATELY. If it is mild, please call our office.  We  will call to schedule your test 2-4 weeks out understanding that some insurance companies will need an authorization prior to the service being performed.   For more information and frequently asked questions, please visit our website : [email or web address]  For non-scheduling related questions, please contact the cardiac imaging nurse navigator should you have any questions/concerns: Cardiac Imaging Nurse Navigators Direct Office Dial: [phone]   For scheduling needs, including cancellations and rescheduling, please call [place], [phone].

## 2023-04-18 LAB — BASIC METABOLIC PANEL
BUN/Creatinine Ratio: 23 (ref 12–28)
BUN: 17 mg/dL (ref 8–27)
CO2: 28 mmol/L (ref 20–29)
Calcium: 9.3 mg/dL (ref 8.7–10.3)
Chloride: 102 mmol/L (ref 96–106)
Creatinine, Ser: 0.73 mg/dL (ref 0.57–1.00)
Glucose: 84 mg/dL (ref 70–99)
Potassium: 4.4 mmol/L (ref 3.5–5.2)
Sodium: 141 mmol/L (ref 134–144)
eGFR: 88 mL/min/{1.73_m2} (ref >59)

## 2023-04-19 ENCOUNTER — Other Ambulatory Visit: Payer: Self-pay | Admitting: Cardiovascular Disease

## 2023-04-19 DIAGNOSIS — H5712 Ocular pain, left eye: Secondary | ICD-10-CM | POA: Diagnosis not present

## 2023-04-19 DIAGNOSIS — Z961 Presence of intraocular lens: Secondary | ICD-10-CM | POA: Diagnosis not present

## 2023-04-19 DIAGNOSIS — H0288A Meibomian gland dysfunction right eye, upper and lower eyelids: Secondary | ICD-10-CM | POA: Diagnosis not present

## 2023-04-19 DIAGNOSIS — H0288B Meibomian gland dysfunction left eye, upper and lower eyelids: Secondary | ICD-10-CM | POA: Diagnosis not present

## 2023-04-19 DIAGNOSIS — R072 Precordial pain: Secondary | ICD-10-CM | POA: Diagnosis not present

## 2023-04-20 LAB — HEPATIC FUNCTION PANEL
ALT: 8 [IU]/L (ref 0–32)
AST: 11 [IU]/L (ref 0–40)
Albumin: 4.4 g/dL (ref 3.8–4.8)
Alkaline Phosphatase: 88 [IU]/L (ref 44–121)
Bilirubin Total: 0.2 mg/dL (ref 0.0–1.2)
Bilirubin, Direct: 0.09 mg/dL (ref 0.00–0.40)
Total Protein: 6.8 g/dL (ref 6.0–8.5)

## 2023-04-20 LAB — LIPID PANEL
Chol/HDL Ratio: 2.5 ratio (ref 0.0–4.4)
Cholesterol, Total: 147 mg/dL (ref 100–199)
HDL: 58 mg/dL (ref >39)
LDL Chol Calc (NIH): 67 mg/dL (ref 0–99)
Triglycerides: 124 mg/dL (ref 0–149)
VLDL Cholesterol Cal: 22 mg/dL (ref 5–40)

## 2023-05-04 ENCOUNTER — Encounter: Payer: Self-pay | Admitting: Adult Health Nurse Practitioner

## 2023-05-04 ENCOUNTER — Telehealth (HOSPITAL_COMMUNITY): Payer: Self-pay | Admitting: *Deleted

## 2023-05-04 NOTE — Telephone Encounter (Signed)
Reaching out to patient to offer assistance regarding upcoming cardiac imaging study; pt verbalizes understanding of appt date/time, parking situation and where to check in, pre-test NPO status and medications ordered, and verified current allergies; name and call back number provided for further questions should they arise  Larey Brick RN Navigator Cardiac Imaging [facility] [phone] office [phone] cell  Patient to take 100mg  metoprolol tartrate two hours prior to her cardiac CT scan. She is aware to arrive at 8:30 AM.

## 2023-05-07 ENCOUNTER — Ambulatory Visit (HOSPITAL_COMMUNITY): Admission: RE | Admit: 2023-05-07 | Payer: Medicare Other | Source: Ambulatory Visit

## 2023-05-17 ENCOUNTER — Telehealth (HOSPITAL_COMMUNITY): Payer: Self-pay | Admitting: *Deleted

## 2023-05-17 NOTE — Telephone Encounter (Signed)
Reaching out to patient to offer assistance regarding upcoming cardiac imaging study; pt verbalizes understanding of appt date/time, parking situation and where to check in, pre-test NPO status and medications ordered, and verified current allergies; name and call back number provided for further questions should they arise  Larey Brick RN Navigator Cardiac Imaging [facility] [phone] office [phone] cell  Patient to take 100mg  metoprolol tartrate two hours prior to her cardiac CT scan. She is aware to arrive at 8 AM.

## 2023-05-18 ENCOUNTER — Ambulatory Visit (HOSPITAL_COMMUNITY)
Admission: RE | Admit: 2023-05-18 | Discharge: 2023-05-18 | Disposition: A | Discharge status: Home / Self Care | Payer: Medicare Other | Source: Ambulatory Visit | Attending: Cardiovascular Disease | Admitting: Cardiovascular Disease

## 2023-05-18 DIAGNOSIS — R072 Precordial pain: Secondary | ICD-10-CM | POA: Insufficient documentation

## 2023-05-18 DIAGNOSIS — I209 Angina pectoris, unspecified: Secondary | ICD-10-CM | POA: Diagnosis not present

## 2023-05-18 MED ORDER — NITROGLYCERIN 0.4 MG SL SUBL
SUBLINGUAL_TABLET | SUBLINGUAL | Status: AC
  Filled 2023-05-18: qty 2

## 2023-05-18 MED ORDER — IOHEXOL 350 MG/ML SOLN
100.0000 mL | Freq: Once | INTRAVENOUS | Status: AC | PRN
  Administered 2023-05-18: 100 mL via INTRAVENOUS

## 2023-05-18 MED ORDER — NITROGLYCERIN 0.4 MG SL SUBL
0.8000 mg | SUBLINGUAL_TABLET | Freq: Once | SUBLINGUAL | Status: AC
Start: 2023-05-18 — End: 2023-05-18
  Administered 2023-05-18: 0.8 mg via SUBLINGUAL

## 2023-06-04 ENCOUNTER — Other Ambulatory Visit: Payer: Self-pay | Admitting: Pharmacy Technician

## 2023-06-05 ENCOUNTER — Telehealth: Payer: Self-pay

## 2023-06-05 NOTE — Telephone Encounter (Signed)
 error

## 2023-06-08 ENCOUNTER — Telehealth: Payer: Self-pay

## 2023-06-08 NOTE — Telephone Encounter (Signed)
 Auth Submission: NO AUTH NEEDED Site of care: Site of care: [facility] Payer: medicare a/b, luminare health supp Medication & CPT/J Code(s) submitted: Prolia  (Denosumab ) [id number] Route of submission (phone, fax, portal): phone Phone # Fax # Auth type: Buy/Bill HB Units/visits requested: 60mg , q65months Reference number:  [id number] Approval from: 06/08/23 to 05/28/24

## 2023-06-12 ENCOUNTER — Ambulatory Visit: Payer: PRIVATE HEALTH INSURANCE | Admitting: Cardiovascular Disease

## 2023-06-29 DIAGNOSIS — M792 Neuralgia and neuritis, unspecified: Secondary | ICD-10-CM | POA: Diagnosis not present

## 2023-06-29 DIAGNOSIS — E66811 Obesity, class 1: Secondary | ICD-10-CM | POA: Diagnosis not present

## 2023-06-29 DIAGNOSIS — D62 Acute posthemorrhagic anemia: Secondary | ICD-10-CM | POA: Diagnosis not present

## 2023-06-29 DIAGNOSIS — R7301 Impaired fasting glucose: Secondary | ICD-10-CM | POA: Diagnosis not present

## 2023-06-29 DIAGNOSIS — Z6833 Body mass index (BMI) 33.0-33.9, adult: Secondary | ICD-10-CM | POA: Diagnosis not present

## 2023-06-29 DIAGNOSIS — E538 Deficiency of other specified B group vitamins: Secondary | ICD-10-CM | POA: Diagnosis not present

## 2023-06-29 DIAGNOSIS — E782 Mixed hyperlipidemia: Secondary | ICD-10-CM | POA: Diagnosis not present

## 2023-06-29 DIAGNOSIS — M48062 Spinal stenosis, lumbar region with neurogenic claudication: Secondary | ICD-10-CM | POA: Diagnosis not present

## 2023-06-29 DIAGNOSIS — E6609 Other obesity due to excess calories: Secondary | ICD-10-CM | POA: Diagnosis not present

## 2023-06-29 DIAGNOSIS — Z0000 Encounter for general adult medical examination without abnormal findings: Secondary | ICD-10-CM | POA: Diagnosis not present

## 2023-06-29 DIAGNOSIS — E559 Vitamin D deficiency, unspecified: Secondary | ICD-10-CM | POA: Diagnosis not present

## 2023-06-29 DIAGNOSIS — Z1231 Encounter for screening mammogram for malignant neoplasm of breast: Secondary | ICD-10-CM | POA: Diagnosis not present

## 2023-07-02 ENCOUNTER — Ambulatory Visit: Payer: PRIVATE HEALTH INSURANCE

## 2023-07-03 ENCOUNTER — Other Ambulatory Visit: Payer: Self-pay | Admitting: Adult Health Nurse Practitioner

## 2023-07-05 DIAGNOSIS — Z1231 Encounter for screening mammogram for malignant neoplasm of breast: Secondary | ICD-10-CM | POA: Diagnosis not present

## 2023-07-05 DIAGNOSIS — R92323 Mammographic fibroglandular density, bilateral breasts: Secondary | ICD-10-CM | POA: Diagnosis not present

## 2023-07-12 ENCOUNTER — Encounter: Payer: Medicare Other | Attending: Adult Health Nurse Practitioner | Admitting: *Deleted

## 2023-07-12 VITALS — BP 118/79 | HR 86 | Temp 98.4°F | Resp 18

## 2023-07-12 DIAGNOSIS — M810 Age-related osteoporosis without current pathological fracture: Secondary | ICD-10-CM | POA: Diagnosis not present

## 2023-07-12 MED ORDER — DENOSUMAB 60 MG/ML ~~LOC~~ SOSY
60.0000 mg | PREFILLED_SYRINGE | Freq: Once | SUBCUTANEOUS | Status: AC
  Administered 2023-07-12: 60 mg via SUBCUTANEOUS

## 2023-07-12 NOTE — Progress Notes (Signed)
Diagnosis: Osteoporosis  Provider:  Roe Rutherford NP  Procedure: Injection  Prolia (Denosumab), Dose: 60 mg, Site: subcutaneous, Number of injections: 1  Injection Site(s): Left upper quad. gluteus  Post Care: Observation period completed  Discharge: Condition: Good, Destination: Home . AVS Provided  Performed by:  Daleen Squibb, RN

## 2023-07-17 ENCOUNTER — Other Ambulatory Visit: Payer: Self-pay | Admitting: Cardiovascular Disease

## 2023-11-20 ENCOUNTER — Other Ambulatory Visit (INDEPENDENT_AMBULATORY_CARE_PROVIDER_SITE_OTHER): Payer: Self-pay

## 2023-11-20 ENCOUNTER — Ambulatory Visit (INDEPENDENT_AMBULATORY_CARE_PROVIDER_SITE_OTHER): Payer: PRIVATE HEALTH INSURANCE | Admitting: Family

## 2023-11-20 ENCOUNTER — Encounter: Payer: Self-pay | Admitting: Family

## 2023-11-20 ENCOUNTER — Encounter: Payer: Self-pay | Admitting: Adult Health Nurse Practitioner

## 2023-11-20 DIAGNOSIS — M79671 Pain in right foot: Secondary | ICD-10-CM

## 2023-11-20 DIAGNOSIS — G8929 Other chronic pain: Secondary | ICD-10-CM

## 2023-11-20 DIAGNOSIS — M25562 Pain in left knee: Secondary | ICD-10-CM | POA: Diagnosis not present

## 2023-11-20 DIAGNOSIS — M1712 Unilateral primary osteoarthritis, left knee: Secondary | ICD-10-CM

## 2023-11-20 DIAGNOSIS — M722 Plantar fascial fibromatosis: Secondary | ICD-10-CM

## 2023-11-20 NOTE — Progress Notes (Signed)
 Office Visit Note   Patient: Patricia Eaton           Date of Birth: [birth year]           MRN: [id number] Visit Date: 11/20/2023              Requested by: Suanne Pfeiffer, NP [address] [place],  [place] [address] PCP: Suanne Pfeiffer, NP  Chief Complaint  Patient presents with   Right Knee - Pain   Right Foot - Pain      HPI: The patient is a 73 year old woman seen today for 2 separate issues right heel pain and left knee pain  The knee has been bothering her for months to years gradually worsening has not been improving she thought it would eventually resolve on its own and has not.  She has not had any associated injuries she denies any mechanical symptoms aching medial and anterior knee pain especially with prolonged activity  Right heel pain which has been ongoing now for about 3 weeks she has not had any associated injury either she is having plantar medial pain especially with bare feet or when wearing her flip-flops she notices mild improvement when she wears her tennis shoes.  She has not tried any treatments  Assessment & Plan: Visit Diagnoses: No diagnosis found.  Plan: Recommended Aleve twice daily for the next 2 weeks discussed heel cord stretching supportive shoewear for her plantar fasciitis on the right.  Discussed osteoarthritis and conservative measures for her osteoarthritic pain left knee.  The patient declined Depo-Medrol  injection today.  Did discuss extracorporeal shockwave therapy with Dr. Burnetta if her plantar fasciitis is ongoing in the next 4 weeks  Follow-Up Instructions: No follow-ups on file.   Right Ankle Exam  Swelling: none  Muscle Strength  The patient has normal right ankle strength.   Comments:  Heel cord tightness with dorsiflexion just shy of neutral.   Left Knee Exam   Muscle Strength  The patient has normal left knee strength.  Tenderness  The patient is experiencing tenderness in the medial joint  line and lateral joint line.  Range of Motion  The patient has normal left knee ROM.  Tests  Varus: negative Valgus: negative  Other  Effusion: no effusion present      Patient is alert, oriented, no adenopathy, well-dressed, normal affect, normal respiratory effort. On examination right foot the foot is plantigrade she does have tenderness to palpation to the origin of the plantar fascia.  Limb length discrepancy  Imaging: No results found. No images are attached to the encounter.  Labs: Lab Results  Component Value Date   ESRSEDRATE 34 (H) 01/11/2021   CRP 12.8 (H) 01/11/2021     Lab Results  Component Value Date   ALBUMIN 4.4 04/19/2023   ALBUMIN 4.2 09/11/2022   ALBUMIN 4.4 11/02/2021    No results found for: MG Lab Results  Component Value Date   VD25OH 59.5 06/30/2021   VD25OH 41 01/11/2021    No results found for: PREALBUMIN    Latest Ref Rng & Units 03/23/2023    1:03 PM 07/05/2021    9:57 AM 01/11/2021   10:50 AM  CBC EXTENDED  WBC 4.0 - 10.5 K/uL 6.4  5.6  5.7   RBC 3.87 - 5.11 MIL/uL 4.26  4.38  4.26   Hemoglobin 12.0 - 15.0 g/dL 87.8  87.0  87.1   HCT 36.0 - 46.0 % 38.6  38.9  38.1  Platelets 150 - 400 K/uL 199  231  225   NEUT# 1.4 - 7.0 x10E3/uL  2.8  3,072   Lymph# 0.7 - 3.1 x10E3/uL  2.1  2,058      There is no height or weight on file to calculate BMI.  Orders:  No orders of the defined types were placed in this encounter.  No orders of the defined types were placed in this encounter.    Procedures: No procedures performed  Clinical Data: No additional findings.  ROS:  All other systems negative, except as noted in the HPI. Review of Systems  Musculoskeletal:  Positive for arthralgias and myalgias.  All other systems reviewed and are negative.   Objective: Vital Signs: There were no vitals taken for this visit.  Specialty Comments:  No specialty comments available.  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   Weight gain following gastric bypass surgery 01/11/2021   Lyme disease 05/07/2020   H/O right hemicolectomy-for inflammatory mass unclear etiology 2003 04/01/2020   Abdominal pain, chronic, right lower quadrant 04/01/2020   Lumbar spinal stenosis 12/15/2019   Leg cramps 12/15/2019   Paresthesia 06/13/2018   Lumbar radiculopathy 06/13/2018   Abdominal pain 03/07/2018   Nausea and vomiting 12/04/2017   Chronic constipation 08/29/2017   History of diverticulitis 08/29/2017   GERD (gastroesophageal reflux disease) 08/29/2017   Chronic low back pain 06/15/2017   Heart palpitations 08/24/2016   Chest pain, pleuritic 11/18/2015   Thrombophilia (HCC) 09/23/2015   Lung infiltrate 09/23/2014   Ex-smoker 09/02/2014   Reactive airway disease 09/02/2014   Shortness of breath 09/02/2014   History of pneumonia 08/31/2014   History of pulmonary embolism 08/31/2014   Anemia due to blood loss, acute 06/25/2014   Neuropathic pain of lower extremity 12/21/2013   Disc herniation 11/05/2012   Past Medical History:  Diagnosis Date   Allergy  to alpha-gal    Angio-edema    Anxiety    Asthma    childhood asthma   Colonic mass    In [place], found inconclusive   Eczema    Environmental allergies    trees, grass, oak trees   GERD (gastroesophageal reflux disease)    Leg cramps 12/15/2019   Lumbar spinal stenosis 12/15/2019   L2-3 level   Lyme disease    Neuropathy    Numbness    Pulmonary embolism (HCC)    Seasonal allergies     Family History  Problem Relation Age of Onset   Colon cancer Maternal Aunt    Crohn's disease Grandchild     Crohn's disease Son    Cancer Mother        kidney or liver   Heart failure Father    Heart attack Father    Pancreatic cancer Sister    Stroke Maternal Grandmother    Allergic rhinitis Neg Hx    Asthma Neg Hx    Eczema Neg Hx    Urticaria Neg Hx    Esophageal cancer Neg Hx    Stomach cancer Neg Hx     Past Surgical History:  Procedure Laterality Date   APPENDECTOMY     COLONOSCOPY  2018  [place] : diverticulosis   ESOPHAGOGASTRODUODENOSCOPY N/A 01/09/2018   normal esophagus, normal residual stomach status post gastric obesity procedure.   GASTRIC BYPASS  2003   LUMBAR FUSION  2016   PARTIAL HYSTERECTOMY  1980   TONSILLECTOMY     WRIST FRACTURE SURGERY  2013   Social History   Occupational History   Occupation: Retired from trucking  Tobacco Use   Smoking status: Former    Current packs/day: 0.00    Types: Cigarettes    Quit date: 05/29/1997    Years since quitting: 26.4   Smokeless tobacco: Never  Vaping Use   Vaping status: Never Used  Substance and Sexual Activity   Alcohol  use: Yes    Comment: occasional   Drug use: Never   Sexual activity: Not on file

## 2023-12-16 ENCOUNTER — Encounter (HOSPITAL_COMMUNITY): Payer: Self-pay

## 2023-12-16 ENCOUNTER — Emergency Department (HOSPITAL_COMMUNITY)
Admission: EM | Admit: 2023-12-16 | Discharge: 2023-12-16 | Disposition: A | Discharge status: Home / Self Care | Attending: Emergency Medicine | Admitting: Emergency Medicine

## 2023-12-16 ENCOUNTER — Other Ambulatory Visit: Payer: Self-pay

## 2023-12-16 ENCOUNTER — Emergency Department (HOSPITAL_COMMUNITY)

## 2023-12-16 DIAGNOSIS — Z7982 Long term (current) use of aspirin: Secondary | ICD-10-CM | POA: Diagnosis not present

## 2023-12-16 DIAGNOSIS — Y92009 Unspecified place in unspecified non-institutional (private) residence as the place of occurrence of the external cause: Secondary | ICD-10-CM | POA: Insufficient documentation

## 2023-12-16 DIAGNOSIS — S6991XA Unspecified injury of right wrist, hand and finger(s), initial encounter: Secondary | ICD-10-CM

## 2023-12-16 DIAGNOSIS — M7989 Other specified soft tissue disorders: Secondary | ICD-10-CM | POA: Diagnosis not present

## 2023-12-16 DIAGNOSIS — Z79899 Other long term (current) drug therapy: Secondary | ICD-10-CM | POA: Insufficient documentation

## 2023-12-16 DIAGNOSIS — Y9301 Activity, walking, marching and hiking: Secondary | ICD-10-CM | POA: Insufficient documentation

## 2023-12-16 DIAGNOSIS — I10 Essential (primary) hypertension: Secondary | ICD-10-CM | POA: Diagnosis not present

## 2023-12-16 DIAGNOSIS — S63501A Unspecified sprain of right wrist, initial encounter: Secondary | ICD-10-CM | POA: Diagnosis not present

## 2023-12-16 DIAGNOSIS — M25531 Pain in right wrist: Secondary | ICD-10-CM | POA: Diagnosis present

## 2023-12-16 DIAGNOSIS — W010XXA Fall on same level from slipping, tripping and stumbling without subsequent striking against object, initial encounter: Secondary | ICD-10-CM | POA: Insufficient documentation

## 2023-12-16 NOTE — Discharge Instructions (Signed)
 Thankfully your x-rays do not reveal any broken bones.  You do likely have a sprain of the wrist however I would like for you to follow-up with the hand specialist, Dr. Margrette is on-call today I have given you his phone number above.  He will likely need to have some repeat x-rays within a week or so.  You can wear the splint as needed for comfort, Tylenol or ibuprofen and ice and rest  RICE therapy:  Apply ice wrapped in a towel intermittently keeping it on the skin no longer than 10 minutes a couple of times an hour  Elevate the affected extremity to help reduce blood flow and prevent swelling  Use an anti-inflammatory if you are not allergic to it such as ibuprofen or Naprosyn to help with pain and swelling  Use a compressive device whether it is an Ace wrap or other  immobilizer to help minimize movement and compress the swelling.

## 2023-12-16 NOTE — ED Triage Notes (Signed)
 Pt states that yesterday afternoon she was helping a neighbor with groceries and she tripped and fell into the grass. Pt braced herself with her arms and is now c/o R distal forearm, wrist, and pinky pain. Pt also c/o L femur pain, but states she has hx of same and feels similar. No head injury/LOC. No blood thinners per pt.

## 2023-12-16 NOTE — ED Provider Notes (Signed)
 [place] EMERGENCY DEPARTMENT AT [facility] Provider Note   CSN: [id number] Arrival date & time: 12/16/23  9048     Patient presents with: Fall and Wrist Pain   Patricia Eaton is a 73 y.o. female.   HPI   This patient is a 73 year old female, she has a history of iron deficiency, hypertension on metoprolol , cholesterol on a statin, she also has some neuropathy and takes Lyrica .  She reports that yesterday she was helping somebody bring groceries into the house when she slipped and fell landing on her right side catching herself with her right hand and sustaining an injury to the right small finger and the dorsum of the hand into the right radial wrist.  She denies any other injuries, she was able to get up and walk around, she was in the grocery store after this yesterday walking around doing some shopping and did feel little bit lightheaded but no other symptoms.  She used ice to help with the swelling of the right hand and small finger and took one of her husbands pain pills though she does not know the name of that medicine at this time.  Prior to Admission medications   Medication Sig Start Date End Date Taking? Authorizing Provider  albuterol (VENTOLIN HFA) 108 (90 Base) MCG/ACT inhaler Inhale into the lungs.    [provider]  Ascorbic Acid (VITAMIN C WITH ROSE HIPS) 1000 MG tablet Take 1,000 mg by mouth daily.    [provider]  aspirin EC 81 MG tablet Take 81 mg by mouth daily.    [provider]  CALCIUM  PO Take 500 mg by mouth daily.     [provider]  cephALEXin (KEFLEX) 250 MG capsule Take 250 mg by mouth daily. 09/21/21   [provider]  CRANBERRY PO Take 1 tablet by mouth daily.    [provider]  ergocalciferol  (VITAMIN D2) 1.25 MG (50000 UT) capsule Take 1 capsule (50,000 Units total) by mouth once a week. 12/06/21   de [place], Raymond J, MD  ferrous sulfate 325 (65 FE) MG tablet Take 325 mg by  mouth daily with breakfast.    [provider]  furosemide (LASIX) 40 MG tablet Take by mouth. 10/25/21   [provider]  ketoconazole  (NIZORAL ) 2 % shampoo Apply to scalp twice a week for 8 weeks and then weekly thereafter 11/02/21   de [place], Quintin PARAS, MD  MAGNESIUM PO Take 1 tablet by mouth daily.    [provider]  melatonin 3 MG TABS tablet Take 6 mg by mouth at bedtime.    [provider]  metoprolol  tartrate (LOPRESSOR ) 100 MG tablet Take 1 tablet (100 mg total) by mouth once for 1 dose. Take 90-120 minutes prior to scan. Hold for SBP less than 110. 04/17/23 04/17/23  Nishan, Peter C, MD  Misc Natural Products (NEURIVA) CAPS See admin instructions.    [provider]  pantoprazole (PROTONIX) 40 MG tablet Take 1 tablet by mouth daily. 10/25/21   [provider]  polyethylene glycol (MIRALAX  / GLYCOLAX ) 17 g packet Take 17 g by mouth as needed for mild constipation or moderate constipation.    [provider]  pregabalin  (LYRICA ) 100 MG capsule TAKE 1 CAPSULE BY MOUTH IN THE MORNING AND TAKE 2 CAPSULES BY MOUTH AT NIGHT 08/18/21   Gayland Lauraine PARAS, NP  PRESCRIPTION MEDICATION Allergy  shot three times weekly    [provider]  rosuvastatin  (CRESTOR ) 5 MG  tablet TAKE ONE TABLET BY MOUTH EVERY DAY 07/19/23   Nishan, Peter C, MD  tiZANidine  (ZANAFLEX ) 4 MG tablet Take 1 tablet (4 mg total) by mouth daily as needed. 07/05/21   de [place], Quintin PARAS, MD  ZEPBOUND 5 MG/0.5ML Pen Inject 5 mg into the skin once a week. 04/16/23   [provider]    Allergies: Molds & smuts and Other    Review of Systems  All other systems reviewed and are negative.   Updated Vital Signs BP 115/80 (BP Location: Left Arm)   Pulse 75   Temp 98.2 F (36.8 C) (Oral)   Resp 16   SpO2 96%   Physical Exam Vitals and nursing note reviewed.  Constitutional:      General: She is not in acute distress.    Appearance: She is well-developed.   HENT:     Head: Normocephalic and atraumatic.     Mouth/Throat:     Pharynx: No oropharyngeal exudate.  Eyes:     General: No scleral icterus.       Right eye: No discharge.        Left eye: No discharge.     Conjunctiva/sclera: Conjunctivae normal.     Pupils: Pupils are equal, round, and reactive to light.  Neck:     Thyroid : No thyromegaly.     Vascular: No JVD.  Cardiovascular:     Rate and Rhythm: Normal rate and regular rhythm.     Heart sounds: Normal heart sounds. No murmur heard.    No friction rub. No gallop.  Pulmonary:     Effort: Pulmonary effort is normal. No respiratory distress.     Breath sounds: Normal breath sounds. No wheezing or rales.  Abdominal:     General: Bowel sounds are normal. There is no distension.     Palpations: Abdomen is soft. There is no mass.     Tenderness: There is no abdominal tenderness.  Musculoskeletal:        General: Swelling, tenderness and signs of injury present.     Cervical back: Normal range of motion and neck supple.     Right lower leg: No edema.     Left lower leg: No edema.     Comments: Bilateral upper and lower extremities were examined in their entirety, she has good range of motion of the bilateral lower extremities at the major muscle groups and soft compartments diffusely.  Left upper extremity normal, right upper extremity has tenderness and pain on the dorsum of the hand the right radial wrist and the right small finger which appear swollen.  She has decreased range of motion of the IP joints of the right small finger  Lymphadenopathy:     Cervical: No cervical adenopathy.  Skin:    General: Skin is warm and dry.     Findings: No erythema or rash.  Neurological:     Mental Status: She is alert.     Coordination: Coordination normal.  Psychiatric:        Behavior: Behavior normal.     (all labs ordered are listed, but only abnormal results are displayed) Labs Reviewed - No data to  display  EKG: None  Radiology: DG Hand Complete Right Result Date: 12/16/2023 CLINICAL DATA:  Trauma.  Swelling. EXAM: RIGHT HAND - COMPLETE 3+ VIEW COMPARISON:  None Available. FINDINGS: Signs of previous plate and screw fixation of the distal radius. There is a cystic lesion within the proximal aspect of the fourth middle phalanx.  This appears well-circumscribed with narrow zone of transition measuring 0.9 x 0.5 cm. There are no signs of acute fracture or subluxation. IMPRESSION: 1. No acute findings. 2. Previous plate and screw fixation of the distal radius. 3. Cystic lesion within the fourth middle phalanx. 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 Electronically Signed   By: Waddell Calk M.D.   On: 12/16/2023 10:55   DG Wrist Complete Right Result Date: 12/16/2023 CLINICAL DATA:  Right wrist trauma, swelling EXAM: RIGHT WRIST - COMPLETE 3+ VIEW COMPARISON:  None Available. FINDINGS: Status post distal right radial ORIF. There is borderline widening of the radioscaphoid interval which may reflect strain or disruption of the radioscaphoid ligament. Otherwise normal alignment. No acute fracture or dislocation. Joint spaces are preserved. Mild soft tissue swelling surrounding the carpus. IMPRESSION: 1. No acute fracture or dislocation. 2. Borderline widening of the radioscaphoid interval which may reflect strain or disruption of the radioscaphoid ligament. Electronically Signed   By: Dorethia Molt M.D.   On: 12/16/2023 10:52     Procedures   Medications Ordered in the ED - No data to display                                  Medical Decision Making Amount and/or Complexity of Data Reviewed Radiology: ordered.    This patient presents to the ED for concern of injury to the right upper extremity differential diagnosis includes pressure, dislocation, contusion, sprain    Additional history obtained   Additional history obtained from Electronic Medical  Record External records from outside source obtained and reviewed including medical record including reviewing the patient's medication record and history of following up with orthopedics because of plantar fasciitis as recently as about a month ago   Lab Tests:  I Ordered, and personally interpreted labs.  The pertinent results include: Not indicated   Imaging Studies ordered:  I ordered imaging studies including x-rays of the right wrist and hand I independently visualized and interpreted imaging which showed negative for fractures or dislocations I agree with the radiologist interpretation   Medicines ordered and prescription drug management:  I ordered medication including patient declines medications    I have reviewed the patients home medicines and have made adjustments as needed   Problem List / ED Course:  Patient is well-appearing, RICE therapy   Social Determinants of Health:  None   I have discussed with the patient at the bedside the results, and the meaning of these results.  They have had opportunity to ask questions,  expressed their understanding to the need for follow-up with primary care physician     Final diagnoses:  Wrist injury, right, initial encounter  Sprain of right wrist, initial encounter    ED Discharge Orders     None          Cleotilde Rogue, MD 12/16/23 1159

## 2024-01-01 ENCOUNTER — Other Ambulatory Visit (HOSPITAL_COMMUNITY): Payer: Self-pay | Admitting: Adult Health Nurse Practitioner

## 2024-01-01 DIAGNOSIS — R109 Unspecified abdominal pain: Secondary | ICD-10-CM

## 2024-01-01 DIAGNOSIS — Z8051 Family history of malignant neoplasm of kidney: Secondary | ICD-10-CM | POA: Diagnosis not present

## 2024-01-01 DIAGNOSIS — N3000 Acute cystitis without hematuria: Secondary | ICD-10-CM | POA: Diagnosis not present

## 2024-01-01 DIAGNOSIS — L989 Disorder of the skin and subcutaneous tissue, unspecified: Secondary | ICD-10-CM | POA: Diagnosis not present

## 2024-01-10 ENCOUNTER — Encounter: Payer: PRIVATE HEALTH INSURANCE | Attending: Adult Health Nurse Practitioner | Admitting: *Deleted

## 2024-01-10 ENCOUNTER — Ambulatory Visit (HOSPITAL_COMMUNITY)
Admission: RE | Admit: 2024-01-10 | Discharge: 2024-01-10 | Disposition: A | Discharge status: Home / Self Care | Source: Ambulatory Visit | Attending: Adult Health Nurse Practitioner | Admitting: Adult Health Nurse Practitioner

## 2024-01-10 VITALS — BP 103/67 | HR 78 | Temp 98.3°F

## 2024-01-10 DIAGNOSIS — N281 Cyst of kidney, acquired: Secondary | ICD-10-CM | POA: Diagnosis not present

## 2024-01-10 DIAGNOSIS — R109 Unspecified abdominal pain: Secondary | ICD-10-CM | POA: Insufficient documentation

## 2024-01-10 DIAGNOSIS — Z8051 Family history of malignant neoplasm of kidney: Secondary | ICD-10-CM | POA: Insufficient documentation

## 2024-01-10 DIAGNOSIS — M810 Age-related osteoporosis without current pathological fracture: Secondary | ICD-10-CM | POA: Insufficient documentation

## 2024-01-10 MED ORDER — DENOSUMAB 60 MG/ML ~~LOC~~ SOSY
60.0000 mg | PREFILLED_SYRINGE | Freq: Once | SUBCUTANEOUS | Status: AC
Start: 2024-01-10 — End: 2024-01-10
  Administered 2024-01-10: 60 mg via SUBCUTANEOUS

## 2024-01-10 NOTE — Progress Notes (Signed)
 Diagnosis: Osteoporosis  Provider:  Suanne Pfeiffer NP  Procedure: Injection  Prolia (Denosumab), Dose: 60 mg, Site: subcutaneous, Number of injections: 1  Injection Site(s): Right upper quad. abdomen  Post Care: Observation period completed  Discharge: Condition: Good, Destination: Home . AVS Provided  Performed by:  Baldwin Darice Helling, RN

## 2024-01-31 ENCOUNTER — Other Ambulatory Visit: Payer: Self-pay | Admitting: Cardiovascular Disease

## 2024-02-04 DIAGNOSIS — Z8719 Personal history of other diseases of the digestive system: Secondary | ICD-10-CM | POA: Diagnosis not present

## 2024-02-04 DIAGNOSIS — R1032 Left lower quadrant pain: Secondary | ICD-10-CM | POA: Diagnosis not present

## 2024-02-06 DIAGNOSIS — M549 Dorsalgia, unspecified: Secondary | ICD-10-CM | POA: Diagnosis not present

## 2024-02-27 DIAGNOSIS — A09 Infectious gastroenteritis and colitis, unspecified: Secondary | ICD-10-CM | POA: Diagnosis not present

## 2024-02-27 DIAGNOSIS — R1032 Left lower quadrant pain: Secondary | ICD-10-CM | POA: Diagnosis not present

## 2024-03-31 ENCOUNTER — Encounter: Payer: Self-pay | Admitting: Radiology

## 2024-05-29 ENCOUNTER — Telehealth: Payer: Self-pay

## 2024-05-29 NOTE — Telephone Encounter (Signed)
 Auth Submission: NO AUTH NEEDED Site of care: Site of care: [facility] Payer: medicare a/b, generic supp Medication & CPT/J Code(s) submitted: Prolia  (Denosumab ) [id number] Diagnosis Code:  Route of submission (phone, fax, portal): portal Phone # Fax # Auth type: Buy/Bill HB Units/visits requested: 60mg  q23months x2 doses Reference number:  Approval from: 05/29/24 to 05/28/25

## 2024-06-10 ENCOUNTER — Other Ambulatory Visit (HOSPITAL_COMMUNITY): Payer: Self-pay | Admitting: Adult Health Nurse Practitioner

## 2024-06-10 NOTE — Progress Notes (Unsigned)
 Received updated Prolia  referral. Unable to locate updated BMP or CMP with calcium  level  Prolia  is scheduled for 07/14/2024  Contacted [facility] for clinicals/labs. Patient has upcoming OV on 07/07/2024. Phone: [phone]  Sherry Pennant, PharmD, MPH, BCPS, CPP Clinical Pharmacist

## 2024-06-11 NOTE — Progress Notes (Signed)
 Received OV notes from 02/27/2019. CMP not drawn. Patient has upcoming OV on 07/07/2024. Likely having CMP drawn at that visit  Sherry Pennant, PharmD, MPH, BCPS, CPP Clinical Pharmacist

## 2024-06-13 ENCOUNTER — Other Ambulatory Visit: Payer: Self-pay | Admitting: Cardiovascular Disease

## 2024-06-16 MED ORDER — ROSUVASTATIN CALCIUM 5 MG PO TABS: 5.0000 mg | ORAL_TABLET | Freq: Every day | ORAL | 0 refills | Status: AC

## 2024-06-16 NOTE — Telephone Encounter (Signed)
 In accordance with refill protocols, please review and address the following requirements before this medication refill can be authorized:  Labs

## 2024-06-25 NOTE — Progress Notes (Signed)
 CARDIOLOGY CONSULT NOTE       Patient ID: Patricia Eaton MRN: [id number] DOB/AGE: 08-21-1950 74 y.o.  Referring Physician: [name] Primary Physician: Suanne Pfeiffer, NP Primary Cardiologist: Delford  Reason for Consultation: Family history of CAD   HPI:  74 y.o. referred by Dr Hughie for family history of CAD First seen by me 02/24/20 . She established care with him on 01/11/20. She has distant history of tick bite Rx doxycycline  lyme testing equivocal. 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 Monitor 2019 no significant arrhythmiias. Rx with PRN atenolol Seemed to be stress induced She has GERD post gastric bypass. Also distant history of PE in 2016    Former smoker quit in 1999  TTE 03/01/20 normal EF 60-65% no significant valve disease   Cardiac CTA 10/15/17 with calcium  score 62 which was 74 th percentile no obstructive CAD isolated to LAD left dominant system  Some cervical /lumbar spine issues previous L4S1 fusion Last injection cervical spine done 12/23/19   Started on lipitor 20 mg 03/05/20 for LDL 111 with LDL now at goal   She and her husband use to ride motorcycles   Seen by PA August 2023 with palpitations and atypical chest pain  Myovue 01/02/22 normal no ischemia EF 73%  Monitor NSR no significant arrhythmias short run atrial tachycardia 8 beats or less started on lopressor  12.5 mg bid  Stays busy with some [employer] in town. Has 3 boys in [place]. One who is a stage hand Has had issues with depression/suicide ideology and is living with the youngest boy now  Seen in ED 03/27/23.  Chest pain R/O negative troponin x 2 Endorses epigastric tenderness as well as lower chest pain off and on for the last week or so. Also endorsing some increasing fatigue in the last month. Has had some associated nausea but denies any vomiting or diaphoresis.  Sore throat but denies any fevers, cough, headache, runny nose  ECG normal CXR NAD ? GERD Trial of PPI suggested   She is very skeptical about our ability to detect life threatening CAD. Her dad died of massive MI after he was told his heart was good a month earlier She has a nephew who drives a truck and had an MI on the road  Discussed further testing with cardiac CTA done 06/04/23 Calcium  score 62, 63 rd percentile for age/sex non obstructive CAD RADS 1 Total plaque volume 182 mm3 , 43 rd percentile   She will get lipids with primary next month Husband a patient of Dr Kennyth and just got an AICD   ROS All other systems reviewed and negative except as noted above  Past Medical History:  Diagnosis Date   Allergy  to alpha-gal    Angio-edema    Anxiety    Asthma    childhood asthma   Colonic mass    In [place], found inconclusive   Eczema    Environmental allergies    trees, grass, oak trees   GERD (gastroesophageal reflux disease)    Leg cramps 12/15/2019   Lumbar spinal stenosis 12/15/2019   L2-3 level   Lyme disease    Neuropathy    Numbness    Pulmonary embolism (HCC)    Seasonal allergies     Family History  Problem Relation Age of Onset   Colon cancer Maternal Aunt    Crohn's disease  Grandchild    Crohn's disease Son    Cancer Mother        kidney or liver   Heart failure Father    Heart attack Father    Pancreatic cancer Sister    Stroke Maternal Grandmother    Allergic rhinitis Neg Hx    Asthma Neg Hx    Eczema Neg Hx    Urticaria Neg Hx    Esophageal cancer Neg Hx    Stomach cancer Neg Hx     Social History   Socioeconomic History   Marital status: Married    Spouse name: Not on file   Number of children: 3   Years of education: 12   Highest education level: High school graduate  Occupational History   Occupation: Retired from trucking  Tobacco Use   Smoking status: Former    Current packs/day: 0.00    Types: Cigarettes    Quit date: 05/29/1997     Years since quitting: 27.0   Smokeless tobacco: Never  Vaping Use   Vaping status: Never Used  Substance and Sexual Activity   Alcohol  use: Yes    Comment: occasional   Drug use: Never   Sexual activity: Not on file  Other Topics Concern   Not on file  Social History Narrative   Lives at home with husband.  Married multiple times.  3 grown sons also grandchildren and step grandchildren.   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    Social Drivers of Health   Tobacco Use: Medium Risk (06/27/2024)   Patient History    Smoking Tobacco Use: Former    Smokeless Tobacco Use: Never    Passive Exposure: Not on file  Financial Resource Strain: Low Risk (12/29/2023)   Received from [facility]   Overall Financial Resource Strain (CARDIA)    How hard is it for you to pay for the very basics like food, housing, medical care, and heating?: Not very hard  Food Insecurity: Food Insecurity Present (12/29/2023)   Received from [facility]   Epic    Within the past 12 months, you worried that your food would run out before you got the money to buy more.: Never true    Within the past 12 months, the food you bought just didn't last and you didn't have money to get more.: Sometimes true  Transportation Needs: No Transportation Needs (12/29/2023)   Received from [facility]    In the past 12 months, has lack of transportation kept you from medical appointments or from getting medications?: No    In the past 12 months, has lack of transportation kept you from meetings, work, or from getting things needed for daily living?: No  Physical Activity: Inactive (12/29/2023)   Received from [facility]   Exercise Vital Sign    On average, how many days per week do you engage in moderate to strenuous exercise (like a brisk walk)?: 0 days    Minutes of  Exercise per Session: Not on file  Stress: No Stress Concern Present (12/29/2023)   Received from [facility] - Occupational Stress Questionnaire    Do you feel stress - tense, restless, nervous, or anxious, or unable to sleep at night because your mind is troubled all the time -  these days?: Only a little  Social Connections: Socially Integrated (12/29/2023)   Received from [facility]   Social Network    How would you rate your social network (family, work, friends)?: Good participation with social networks  Intimate Partner Violence: Not At Risk (12/29/2023)   Received from [facility]   HITS    Over the last 12 months how often did your partner physically hurt you?: Never    Over the last 12 months how often did your partner insult you or talk down to you?: Rarely    Over the last 12 months how often did your partner threaten you with physical harm?: Never    Over the last 12 months how often did your partner scream or curse at you?: Rarely  Depression (PHQ2-9): Low Risk (01/10/2024)   Depression (PHQ2-9)    PHQ-2 Score: 0  Alcohol  Screen: Low Risk (10/07/2021)   Alcohol  Screen    Last Alcohol  Screening Score (AUDIT): 2  Housing: Low Risk (12/29/2023)   Received from [facility]    In the last 12 months, was there a time when you were not able to pay the mortgage or rent on time?: No    In the past 12 months, how many times have you moved where you were living?: 0    At any time in the past 12 months, were you homeless or living in a shelter (including now)?: No  Utilities: Not At Risk (12/29/2023)   Received from [facility]    In the past 12 months has the electric, gas, oil, or water  company threatened to shut off services in your home?: No  Health Literacy: Not on file    Past Surgical History:  Procedure Laterality Date   APPENDECTOMY     COLONOSCOPY  2018   [place] : diverticulosis   ESOPHAGOGASTRODUODENOSCOPY  N/A 01/09/2018   normal esophagus, normal residual stomach status post gastric obesity procedure.   GASTRIC BYPASS  2003   LUMBAR FUSION  2016   PARTIAL HYSTERECTOMY  1980   TONSILLECTOMY     WRIST FRACTURE SURGERY  2013      Current Outpatient Medications:    albuterol (VENTOLIN HFA) 108 (90 Base) MCG/ACT inhaler, Inhale into the lungs., Disp: , Rfl:    Ascorbic Acid (VITAMIN C WITH ROSE HIPS) 1000 MG tablet, Take 1,000 mg by mouth daily., Disp: , Rfl:    aspirin EC 81 MG tablet, Take 81 mg by mouth daily., Disp: , Rfl:    CALCIUM  PO, Take 500 mg by mouth daily. , Disp: , Rfl:    cephALEXin (KEFLEX) 250 MG capsule, Take 250 mg by mouth daily., Disp: , Rfl:    CRANBERRY PO, Take 1 tablet by mouth daily., Disp: , Rfl:    ergocalciferol  (VITAMIN D2) 1.25 MG (50000 UT) capsule, Take 1 capsule (50,000 Units total) by mouth once a week., Disp: 12 capsule, Rfl: 3   ferrous sulfate 325 (65 FE) MG tablet, Take 325 mg by mouth daily with breakfast., Disp: , Rfl:    furosemide (LASIX) 40 MG tablet, Take by mouth., Disp: , Rfl:    MAGNESIUM PO, Take 1 tablet by mouth daily., Disp: , Rfl:    melatonin 3 MG TABS tablet, Take 6 mg by mouth at bedtime., Disp: , Rfl:    metoprolol  tartrate (LOPRESSOR ) 100 MG tablet, Take 1 tablet (100 mg total) by mouth once for 1 dose. Take 90-120 minutes prior to scan. Hold for  SBP less than 110., Disp: 1 tablet, Rfl: 0   Misc Natural Products (NEURIVA) CAPS, See admin instructions., Disp: , Rfl:    pantoprazole (PROTONIX) 40 MG tablet, Take 1 tablet by mouth daily., Disp: , Rfl:    polyethylene glycol (MIRALAX  / GLYCOLAX ) 17 g packet, Take 17 g by mouth as needed for mild constipation or moderate constipation., Disp: , Rfl:    pregabalin  (LYRICA ) 100 MG capsule, TAKE 1 CAPSULE BY MOUTH IN THE MORNING AND TAKE 2 CAPSULES BY MOUTH AT NIGHT, Disp: 270 capsule, Rfl: 1   PRESCRIPTION MEDICATION, Allergy  shot three times weekly, Disp: , Rfl:    rosuvastatin  (CRESTOR ) 5 MG  tablet, Take 1 tablet (5 mg total) by mouth daily., Disp: 90 tablet, Rfl: 0   tiZANidine  (ZANAFLEX ) 4 MG tablet, Take 1 tablet (4 mg total) by mouth daily as needed., Disp: 30 tablet, Rfl: 1   ZEPBOUND 5 MG/0.5ML Pen, Inject 5 mg into the skin once a week., Disp: , Rfl:     Physical Exam: Blood pressure 124/68, pulse 66, height 5' 7 (1.702 m), weight 176 lb (79.8 kg), SpO2 96%.   Affect appropriate Healthy:  appears stated age HEENT: normal Neck supple with no adenopathy JVP normal no bruits no thyromegaly Lungs clear with no wheezing and good diaphragmatic motion Heart:  S1/S2 no murmur, no rub, gallop or click PMI normal Abdomen: benighn, post bariatric surgery  Distal pulses intact with no bruits No edema Neuro non-focal Skin warm and dry No muscular weakness   Labs:   Lab Results  Component Value Date   WBC 6.4 03/23/2023   HGB 12.1 03/23/2023   HCT 38.6 03/23/2023   MCV 90.6 03/23/2023   PLT 199 03/23/2023   No results for input(s): NA, K, CL, CO2, BUN, CREATININE, CALCIUM , PROT, BILITOT, ALKPHOS, ALT, AST, GLUCOSE in the last 168 hours.  Invalid input(s): LABALBU No results found for: CKTOTAL, CKMB, CKMBINDEX, TROPONINI  Lab Results  Component Value Date   CHOL 147 04/19/2023   CHOL 121 09/11/2022   CHOL 134 05/26/2020   Lab Results  Component Value Date   HDL 58 04/19/2023   HDL 47 09/11/2022   HDL 63 05/26/2020   Lab Results  Component Value Date   LDLCALC 67 04/19/2023   LDLCALC 54 09/11/2022   LDLCALC 52 05/26/2020   Lab Results  Component Value Date   TRIG 124 04/19/2023   TRIG 107 09/11/2022   TRIG 102 05/26/2020   Lab Results  Component Value Date   CHOLHDL 2.5 04/19/2023   CHOLHDL 2.6 09/11/2022   CHOLHDL 3.8 02/27/2020   No results found for: LDLDIRECT    Radiology: No results found.  EKG: 06/27/2024 SR rate 80 normal    ASSESSMENT AND PLAN:   1. CAD: On ASA 81 mg Started on lipitor and  LDL at target now Myovue 12/2021 non ischemic ER visit 03/27/23 ? GERD with R/O normal ECG, negative troponin x2 and CXR NAD. Updated cardiac CTA 05/18/23 CAD RADS 1 non obstructive calcium  score 62, 63 rd percentile TPV 182 mm3 , 43 rd percentile   2. GERD: post gastric bypass on Prilosec   3. Ortho:  Lumbar and cervical spine issues post fusion and injection Rx f/u IR radiology and neuro continue Lyrica   4. Fatigue:  Functional rhythm and vitals fine exam fine EF 60-65% by TTE 03/01/20 no significant valve dx and normal EF on myovue 01/02/22   5. Palpitations: benign monitor 01/31/22 continue low dose beta blocker  6. Lyme Dx: ? Distant repeat testing 02/15/22 negative   7. Diverticulitis: flare 02/2024 Rx with Augmentin  and Bentyl Lipase, WBC and LFTls normal.     F/U in a year     Signed: Maude Emmer 06/27/2024, 9:55 AM

## 2024-06-27 ENCOUNTER — Ambulatory Visit: Attending: Cardiovascular Disease | Admitting: Cardiovascular Disease

## 2024-06-27 ENCOUNTER — Encounter: Payer: Self-pay | Admitting: Cardiovascular Disease

## 2024-06-27 VITALS — BP 124/68 | HR 66 | Ht 67.0 in | Wt 176.0 lb

## 2024-06-27 DIAGNOSIS — E782 Mixed hyperlipidemia: Secondary | ICD-10-CM | POA: Insufficient documentation

## 2024-06-27 DIAGNOSIS — I2510 Atherosclerotic heart disease of native coronary artery without angina pectoris: Secondary | ICD-10-CM | POA: Diagnosis present

## 2024-06-27 DIAGNOSIS — R002 Palpitations: Secondary | ICD-10-CM | POA: Insufficient documentation

## 2024-06-27 DIAGNOSIS — R072 Precordial pain: Secondary | ICD-10-CM | POA: Insufficient documentation

## 2024-06-27 NOTE — Patient Instructions (Signed)
 Medication Instructions:  No medication changes were made at this visit. Continue current regimen.   *If you need a refill on your cardiac medications before your next appointment, please call your pharmacy*  Lab Work: None ordered today. If you have labs (blood work) drawn today and your tests are completely normal, you will receive your results only by: MyChart Message (if you have MyChart) OR A paper copy in the mail If you have any lab test that is abnormal or we need to change your treatment, we will call you to review the results.  Testing/Procedures: None ordered today.  Follow-Up: At [facility] you and your health needs are our priority.  As part of our continuing mission to provide you with exceptional heart care, our providers are all part of one team.  This team includes your primary Cardiologist (physician) and Advanced Practice Providers or APPs (Physician Assistants and Nurse Practitioners) who all work together to provide you with the care you need, when you need it.  Your next appointment:   1 year(s)  Provider:   Maude Emmer, MD

## 2024-07-02 ENCOUNTER — Ambulatory Visit: Admitting: Cardiovascular Disease

## 2024-07-14 ENCOUNTER — Ambulatory Visit
